# Patient Record
Sex: Male | Born: 1935
Health system: Southern US, Community
[De-identification: ages and names within clinical notes are randomized; demographics above are authoritative.]

## PROBLEM LIST (undated history)

## (undated) DIAGNOSIS — K449 Diaphragmatic hernia without obstruction or gangrene: Secondary | ICD-10-CM

## (undated) DIAGNOSIS — R002 Palpitations: Secondary | ICD-10-CM

## (undated) DIAGNOSIS — I10 Essential (primary) hypertension: Secondary | ICD-10-CM

## (undated) DIAGNOSIS — K219 Gastro-esophageal reflux disease without esophagitis: Secondary | ICD-10-CM

## (undated) DIAGNOSIS — K5790 Diverticulosis of intestine, part unspecified, without perforation or abscess without bleeding: Secondary | ICD-10-CM

## (undated) DIAGNOSIS — R9439 Abnormal result of other cardiovascular function study: Secondary | ICD-10-CM

## (undated) DIAGNOSIS — E119 Type 2 diabetes mellitus without complications: Secondary | ICD-10-CM

## (undated) DIAGNOSIS — IMO0001 Reserved for inherently not codable concepts without codable children: Secondary | ICD-10-CM

## (undated) DIAGNOSIS — E785 Hyperlipidemia, unspecified: Secondary | ICD-10-CM

## (undated) HISTORY — DX: Reserved for inherently not codable concepts without codable children: IMO0001

## (undated) HISTORY — DX: Diverticulosis of intestine, part unspecified, without perforation or abscess without bleeding: K57.90

## (undated) HISTORY — DX: Diaphragmatic hernia without obstruction or gangrene: K44.9

## (undated) HISTORY — PX: TONSILLECTOMY: SUR1361

## (undated) HISTORY — DX: Abnormal result of other cardiovascular function study: R94.39

## (undated) HISTORY — DX: Gastro-esophageal reflux disease without esophagitis: K21.9

## (undated) HISTORY — PX: ORIF CLAVICULAR FRACTURE: SHX5055

## (undated) HISTORY — PX: COLONOSCOPY: SHX174

## (undated) HISTORY — PX: INGUINAL HERNIA REPAIR: SHX194

## (undated) HISTORY — DX: Type 2 diabetes mellitus without complications: E11.9

## (undated) HISTORY — DX: Hyperlipidemia, unspecified: E78.5

## (undated) HISTORY — DX: Palpitations: R00.2

## (undated) HISTORY — PX: HEMORRHOID SURGERY: SHX153

## (undated) HISTORY — PX: CHOLECYSTECTOMY: SHX55

---

## 2001-06-06 ENCOUNTER — Emergency Department (HOSPITAL_COMMUNITY): Admission: EM | Admit: 2001-06-06 | Discharge: 2001-06-06 | Payer: Self-pay | Admitting: Emergency Medicine

## 2002-03-05 ENCOUNTER — Encounter (INDEPENDENT_AMBULATORY_CARE_PROVIDER_SITE_OTHER): Payer: Self-pay | Admitting: *Deleted

## 2002-06-24 ENCOUNTER — Encounter: Admission: RE | Admit: 2002-06-24 | Discharge: 2002-06-24 | Payer: Self-pay | Admitting: Internal Medicine

## 2002-06-24 ENCOUNTER — Encounter: Payer: Self-pay | Admitting: Internal Medicine

## 2002-12-30 ENCOUNTER — Emergency Department (HOSPITAL_COMMUNITY): Admission: EM | Admit: 2002-12-30 | Discharge: 2002-12-30 | Payer: Self-pay | Admitting: Emergency Medicine

## 2003-01-13 ENCOUNTER — Ambulatory Visit (HOSPITAL_COMMUNITY): Admission: RE | Admit: 2003-01-13 | Discharge: 2003-01-14 | Payer: Self-pay | Admitting: Orthopedic Surgery

## 2004-03-29 ENCOUNTER — Ambulatory Visit: Payer: Self-pay | Admitting: Internal Medicine

## 2005-03-24 ENCOUNTER — Ambulatory Visit: Payer: Self-pay | Admitting: Internal Medicine

## 2005-05-30 ENCOUNTER — Ambulatory Visit: Payer: Self-pay | Admitting: Gastroenterology

## 2005-06-07 ENCOUNTER — Ambulatory Visit: Payer: Self-pay | Admitting: Gastroenterology

## 2005-06-27 ENCOUNTER — Ambulatory Visit: Payer: Self-pay | Admitting: Gastroenterology

## 2005-07-01 ENCOUNTER — Encounter (INDEPENDENT_AMBULATORY_CARE_PROVIDER_SITE_OTHER): Payer: Self-pay | Admitting: *Deleted

## 2006-01-17 ENCOUNTER — Ambulatory Visit: Payer: Self-pay | Admitting: Internal Medicine

## 2006-02-06 ENCOUNTER — Encounter: Admission: RE | Admit: 2006-02-06 | Discharge: 2006-02-06 | Payer: Self-pay | Admitting: Internal Medicine

## 2006-06-05 ENCOUNTER — Ambulatory Visit: Payer: Self-pay | Admitting: Internal Medicine

## 2006-08-25 ENCOUNTER — Encounter: Payer: Self-pay | Admitting: Internal Medicine

## 2006-11-23 ENCOUNTER — Encounter: Payer: Self-pay | Admitting: Internal Medicine

## 2007-02-08 ENCOUNTER — Telehealth (INDEPENDENT_AMBULATORY_CARE_PROVIDER_SITE_OTHER): Payer: Self-pay | Admitting: *Deleted

## 2007-02-09 ENCOUNTER — Ambulatory Visit: Payer: Self-pay | Admitting: Internal Medicine

## 2007-02-09 DIAGNOSIS — R002 Palpitations: Secondary | ICD-10-CM | POA: Insufficient documentation

## 2007-02-09 DIAGNOSIS — I499 Cardiac arrhythmia, unspecified: Secondary | ICD-10-CM | POA: Insufficient documentation

## 2007-02-10 ENCOUNTER — Encounter (INDEPENDENT_AMBULATORY_CARE_PROVIDER_SITE_OTHER): Payer: Self-pay | Admitting: *Deleted

## 2007-02-16 ENCOUNTER — Encounter (INDEPENDENT_AMBULATORY_CARE_PROVIDER_SITE_OTHER): Payer: Self-pay | Admitting: *Deleted

## 2007-02-20 ENCOUNTER — Telehealth (INDEPENDENT_AMBULATORY_CARE_PROVIDER_SITE_OTHER): Payer: Self-pay | Admitting: *Deleted

## 2007-02-26 ENCOUNTER — Ambulatory Visit: Payer: Self-pay

## 2007-03-01 DIAGNOSIS — IMO0001 Reserved for inherently not codable concepts without codable children: Secondary | ICD-10-CM

## 2007-03-01 HISTORY — DX: Reserved for inherently not codable concepts without codable children: IMO0001

## 2007-03-09 ENCOUNTER — Encounter: Payer: Self-pay | Admitting: Internal Medicine

## 2007-03-15 ENCOUNTER — Telehealth (INDEPENDENT_AMBULATORY_CARE_PROVIDER_SITE_OTHER): Payer: Self-pay | Admitting: *Deleted

## 2007-03-21 ENCOUNTER — Telehealth (INDEPENDENT_AMBULATORY_CARE_PROVIDER_SITE_OTHER): Payer: Self-pay | Admitting: *Deleted

## 2007-03-27 ENCOUNTER — Ambulatory Visit: Payer: Self-pay | Admitting: Cardiology

## 2007-04-19 ENCOUNTER — Ambulatory Visit: Payer: Self-pay | Admitting: Cardiology

## 2007-05-28 ENCOUNTER — Ambulatory Visit: Payer: Self-pay | Admitting: Internal Medicine

## 2007-05-28 DIAGNOSIS — E119 Type 2 diabetes mellitus without complications: Secondary | ICD-10-CM | POA: Insufficient documentation

## 2007-05-28 DIAGNOSIS — R3915 Urgency of urination: Secondary | ICD-10-CM | POA: Insufficient documentation

## 2007-05-28 DIAGNOSIS — R351 Nocturia: Secondary | ICD-10-CM | POA: Insufficient documentation

## 2007-05-28 DIAGNOSIS — R3129 Other microscopic hematuria: Secondary | ICD-10-CM | POA: Insufficient documentation

## 2007-05-28 LAB — CONVERTED CEMR LAB
Bilirubin Urine: NEGATIVE
Glucose, Urine, Semiquant: NEGATIVE
Ketones, urine, test strip: NEGATIVE
Nitrite: NEGATIVE
Protein, U semiquant: NEGATIVE
Specific Gravity, Urine: 1.02
Urobilinogen, UA: NEGATIVE
WBC Urine, dipstick: NEGATIVE
pH: 5

## 2007-06-04 ENCOUNTER — Encounter (INDEPENDENT_AMBULATORY_CARE_PROVIDER_SITE_OTHER): Payer: Self-pay | Admitting: *Deleted

## 2007-06-11 ENCOUNTER — Encounter: Payer: Self-pay | Admitting: Internal Medicine

## 2007-06-19 ENCOUNTER — Encounter: Payer: Self-pay | Admitting: Internal Medicine

## 2007-09-26 ENCOUNTER — Encounter: Payer: Self-pay | Admitting: Internal Medicine

## 2008-01-08 ENCOUNTER — Encounter: Payer: Self-pay | Admitting: Internal Medicine

## 2008-04-25 ENCOUNTER — Encounter: Payer: Self-pay | Admitting: Internal Medicine

## 2008-07-25 ENCOUNTER — Encounter: Payer: Self-pay | Admitting: Internal Medicine

## 2008-11-04 ENCOUNTER — Encounter: Payer: Self-pay | Admitting: Internal Medicine

## 2009-02-24 ENCOUNTER — Encounter: Payer: Self-pay | Admitting: Internal Medicine

## 2009-06-03 ENCOUNTER — Encounter: Payer: Self-pay | Admitting: Internal Medicine

## 2009-07-07 ENCOUNTER — Encounter (INDEPENDENT_AMBULATORY_CARE_PROVIDER_SITE_OTHER): Payer: Self-pay | Admitting: *Deleted

## 2009-08-06 ENCOUNTER — Encounter: Payer: Self-pay | Admitting: Internal Medicine

## 2009-08-12 ENCOUNTER — Encounter: Payer: Self-pay | Admitting: Internal Medicine

## 2009-08-18 ENCOUNTER — Encounter: Payer: Self-pay | Admitting: Internal Medicine

## 2009-08-27 ENCOUNTER — Ambulatory Visit: Payer: Self-pay | Admitting: Gastroenterology

## 2009-08-27 DIAGNOSIS — K5730 Diverticulosis of large intestine without perforation or abscess without bleeding: Secondary | ICD-10-CM | POA: Insufficient documentation

## 2009-08-27 DIAGNOSIS — K219 Gastro-esophageal reflux disease without esophagitis: Secondary | ICD-10-CM | POA: Insufficient documentation

## 2009-09-02 ENCOUNTER — Encounter: Payer: Self-pay | Admitting: Internal Medicine

## 2009-10-06 ENCOUNTER — Telehealth: Payer: Self-pay | Admitting: Gastroenterology

## 2009-10-07 ENCOUNTER — Ambulatory Visit: Payer: Self-pay | Admitting: Gastroenterology

## 2009-10-07 DIAGNOSIS — R1013 Epigastric pain: Secondary | ICD-10-CM | POA: Insufficient documentation

## 2009-10-09 ENCOUNTER — Ambulatory Visit: Payer: Self-pay | Admitting: Gastroenterology

## 2009-10-13 ENCOUNTER — Encounter: Payer: Self-pay | Admitting: Gastroenterology

## 2009-10-15 ENCOUNTER — Ambulatory Visit: Payer: Self-pay | Admitting: Gastroenterology

## 2009-10-19 LAB — CONVERTED CEMR LAB
Fecal Occult Blood: NEGATIVE
OCCULT 1: NEGATIVE
OCCULT 2: NEGATIVE
OCCULT 3: NEGATIVE
OCCULT 4: NEGATIVE
OCCULT 5: NEGATIVE

## 2009-10-28 ENCOUNTER — Ambulatory Visit (HOSPITAL_COMMUNITY): Admission: RE | Admit: 2009-10-28 | Discharge: 2009-10-28 | Payer: Self-pay | Admitting: Gastroenterology

## 2009-12-07 ENCOUNTER — Telehealth: Payer: Self-pay | Admitting: Gastroenterology

## 2009-12-28 ENCOUNTER — Encounter: Payer: Self-pay | Admitting: Internal Medicine

## 2009-12-29 ENCOUNTER — Encounter: Payer: Self-pay | Admitting: Internal Medicine

## 2010-03-28 LAB — CONVERTED CEMR LAB
Calcium: 9 mg/dL (ref 8.4–10.5)
Magnesium: 2.1 mg/dL (ref 1.5–2.5)
Potassium: 3.7 meq/L (ref 3.5–5.1)
TSH: 1.5 microintl units/mL (ref 0.35–5.50)

## 2010-03-30 NOTE — Procedures (Signed)
Summary: Upper Endoscopy  Patient: Scott Herman Note: All result statuses are Final unless otherwise noted.  Tests: (1) Upper Endoscopy (EGD)   EGD Upper Endoscopy       DONE     [facility]     [address]     [place], [place]  [address]           ENDOSCOPY PROCEDURE REPORT           PATIENT:  Scott Herman, Scott Herman  MR#:  [id number]     BIRTHDATE:  [birth year], 76 yrs. old  GENDER:  male     ENDOSCOPIST:  Judie Petit T. Russella Dar, MD, [facility]           PROCEDURE DATE:  10/09/2009     PROCEDURE:  EGD with biopsy     ASA CLASS:  Class II     INDICATIONS:  GERD, chest pain, epigastric pain     MEDICATIONS:  Fentanyl 50 mcg IV, Versed 5 mg IV     TOPICAL ANESTHETIC:  Exactacain Spray     DESCRIPTION OF PROCEDURE:   After the risks benefits and     alternatives of the procedure were thoroughly explained, informed     consent was obtained.  The LB GIF-H180 [id number] endoscope was     introduced through the mouth and advanced to the second portion of     the duodenum, without limitations.  The instrument was slowly     withdrawn as the mucosa was fully examined.     <<PROCEDUREIMAGES>>     The esophagus and gastroesophageal junction were completely normal     in appearance.  The duodenal bulb was normal in appearance, as was     the postbulbar duodenum.  Mild gastritis was found in the antrum.     Multiple biopsies were obtained and sent to pathology.  The     stomach was entered and closely examined. The angularis, and     lesser curvature were well visualized, including a retroflexed     view of the cardia and fundus. The stomach wall was normally     distensable. The scope passed easily through the pylorus into the     duodenum. Retroflexed views revealed no abnormalities. The scope     was then withdrawn from the patient and the procedure completed.           COMPLICATIONS:  None           ENDOSCOPIC IMPRESSION:     1) Mild gastritis           RECOMMENDATIONS:     1) Await  pathology results     2) Anti-reflux regimen     3) continue PPI     4) If GES is normal and/or if symptoms persist consider esophageal     manometry     5) Office visit in 4 weeks           Malcolm T. Russella Dar, MD, [facility]           CC:  Pecola Lawless, MD           n.     Rosalie DoctorVenita Lick. Stark at 10/09/2009 04:19 PM           Gaspar Garbe, [id number]  Note: An exclamation mark (!) indicates a result that was not dispersed into the flowsheet. Document Creation Date: 10/09/2009 4:20 PM _______________________________________________________________________  (1) Order result status: Final Collection or observation date-time: 10/09/2009 16:14  Requested date-time:  Receipt date-time:  Reported date-time:  Referring Physician:   Ordering Physician: Claudette Head [phone]) Specimen Source:  Source: Launa Grill Order Number: [id number] Lab site:

## 2010-03-30 NOTE — Letter (Signed)
Summary: Patient [facility] Biopsy Results  [facility]  [address] [place], [place] [address]   Phone: [phone]  Fax: [phone]        October 13, 2009 MRN: [id number]    [facility] [address] [place], [place]  [address]    Dear Mr. Tidwell,  I am pleased to inform you that the biopsies taken during your recent endoscopic examination did not show any evidence of cancer upon pathologic examination. The biopsies were normal.  Continue with the treatment plan as outlined on the day of your      exam.  Please call us if you are having persistent problems or have questions about your condition that have not been fully answered at this time.  Sincerely,  Meryl Dare MD [facility]  This letter has been electronically signed by your physician.  Appended Document: Patient Notice-Endo Biopsy Results letter mailed

## 2010-03-30 NOTE — Letter (Signed)
Summary: [facility]  [facility]   Imported By: Lanelle Bal 08/20/2009 09:46:39  _____________________________________________________________________  External Attachment:    Type:   Image     Comment:   External Document

## 2010-03-30 NOTE — Progress Notes (Signed)
Summary: refill request  Medications Added DEXILANT 60 MG CPDR (DEXLANSOPRAZOLE) one tablet by mouth once daily       Phone Note Call from Patient Call back at [facility] Phone [phone]   Caller: Patient Call For: Dr. Russella Dar Reason for Call: Refill Medication Summary of Call: Dexilant... [facility]... please call pt Initial call taken by: Vallarie Mare,  December 07, 2009 2:50 PM  Follow-up for Phone Call        Rx was sent to pts pharmacy.  Follow-up by: Christie Nottingham CMA Duncan Dull),  December 07, 2009 4:12 PM    New/Updated Medications: DEXILANT 60 MG CPDR (DEXLANSOPRAZOLE) one tablet by mouth once daily Prescriptions: DEXILANT 60 MG CPDR (DEXLANSOPRAZOLE) one tablet by mouth once daily  #90 x 3   Entered by:   Christie Nottingham CMA (AAMA)   Authorized by:   Meryl Dare MD [facility]   Signed by:   Christie Nottingham CMA (AAMA) on 12/07/2009   Method used:   Electronically to        [facility]* (retail)             ,          Ph: [phone]       Fax: [phone]   RxID:   [id number]

## 2010-03-30 NOTE — Letter (Signed)
Summary: [facility]  [facility]   Imported By: Lanelle Bal 08/20/2009 09:44:47  _____________________________________________________________________  External Attachment:    Type:   Image     Comment:   External Document

## 2010-03-30 NOTE — Letter (Signed)
Summary: [facility]  [facility]   Imported By: Lanelle Bal 01/12/2010 10:55:01  _____________________________________________________________________  External Attachment:    Type:   Image     Comment:   External Document

## 2010-03-30 NOTE — Letter (Signed)
Summary: New Patient letter  [facility]  [address] [place], [place] [address]   Phone: [phone]  Fax: [phone]       07/07/2009 MRN: [id number]  [facility] [address] [place], [place]  [address]  Dear Mr. Scholz,  Welcome to the Gastroenterology Division at [facility].    You are scheduled to see Dr. Russella Dar on 08/27/2009 at 9:30AM on the 3rd floor at [facility], [address]  We ask that you try to arrive at our office 15 minutes prior to your appointment time to allow for check-in.  We would like you to complete the enclosed self-administered evaluation form prior to your visit and bring it with you on the day of your appointment.  We will review it with you.  Also, please bring a complete list of all your medications or, if you prefer, bring the medication bottles and we will list them.  Please bring your insurance card so that we may make a copy of it.  If your insurance requires a referral to see a specialist, please bring your referral form from your primary care physician.  Co-payments are due at the time of your visit and may be paid by cash, check or credit card.     Your office visit will consist of a consult with your physician (includes a physical exam), any laboratory testing he/she may order, scheduling of any necessary diagnostic testing (e.g. x-ray, ultrasound, CT-scan), and scheduling of a procedure (e.g. Endoscopy, Colonoscopy) if required.  Please allow enough time on your schedule to allow for any/all of these possibilities.    If you cannot keep your appointment, please call [phone] to cancel or reschedule prior to your appointment date.  This allows [place] the opportunity to schedule an appointment for another patient in need of care.  If you do not cancel or reschedule by 5 p.m. the business day prior to your appointment date, you will be charged a $50.00 late cancellation/no-show fee.    Thank you for choosing  [facility] for your medical needs.  We appreciate the opportunity to care for you.  Please visit [place] at our website  to learn more about our practice.                     Sincerely,                                                             The Gastroenterology Division

## 2010-03-30 NOTE — Letter (Signed)
Summary: [facility]  [facility]   Imported By: Lanelle Bal 03/09/2009 09:23:12  _____________________________________________________________________  External Attachment:    Type:   Image     Comment:   External Document

## 2010-03-30 NOTE — Letter (Signed)
Summary: [facility]  [facility]   Imported By: Lanelle Bal 06/11/2009 09:07:47  _____________________________________________________________________  External Attachment:    Type:   Image     Comment:   External Document

## 2010-03-30 NOTE — Assessment & Plan Note (Signed)
Summary: 6-WEEK F/U APPT...LSW.   History of Present Illness Visit Type: Follow-up Visit Primary GI MD: Elie Goody MD [facility] Primary Tu Shimmel: Marga Melnick, MD  Requesting Donnella Morford: na Chief Complaint: Upper abd pain, belching, and pain in esopaghus when he swallows  History of Present Illness:   This 77 year old male returns for followup of epigastric pain and chest pain. He describes a soreness and difficulty when swallowing intermittently. She also has episodic epigastric pain and belching. His symptoms have not improved on omeprazole 40 mg daily, although he does have some temporary benefit when he takes Maalox. His appetite is excellent and his weight has been stable. He has no other gastrointestinal complaints.   GI Review of Systems    Reports abdominal pain and  belching.     Location of  Abdominal pain: upper abdomen.    Denies acid reflux, bloating, chest pain, dysphagia with liquids, dysphagia with solids, heartburn, loss of appetite, nausea, vomiting, vomiting blood, weight loss, and  weight gain.        Denies anal fissure, black tarry stools, change in bowel habit, constipation, diarrhea, diverticulosis, fecal incontinence, heme positive stool, hemorrhoids, irritable bowel syndrome, jaundice, light color stool, liver problems, rectal bleeding, and  rectal pain.   Current Medications (verified): 1)  Adult Aspirin Ec Low Strength 81 Mg  Tbec (Aspirin) .[name] Kitchen.. 1 By Mouth Qd 2)  Glucophage Xr 750 Mg  Tb24 (Metformin Hcl) .... Take One Tab Two Times A Day 3)  Actos 45 Mg  Tabs (Pioglitazone Hcl) .[name] Kitchen.. 1 By Mouth Qd 4)  Januvia 100 Mg  Tabs (Sitagliptin Phosphate) .... Take One Tablet Every Evening 5)  Pravachol 80 Mg  Tabs (Pravastatin Sodium) .... Take One Tablet At Bedtime 6)  Lantus Solostar 100 Unit/ml  Soln (Insulin Glargine) .[name] Kitchen.. 12 Units Qam 7)  Bd Ultra-Fine Pen Needles 29g X 12.105mm  Misc (Insulin Pen Needle) .... Use Daily With Lantus Solopen 8)  Lifescan Strips and  Lancets .... Insulin Dependent Check Up To Five Times Daily 9)  Flomax 0.4 Mg  Cp24 (Tamsulosin Hcl) .[name] Kitchen.. 1 Qd 10)  Omeprazole 40 Mg Cpdr (Omeprazole) .... One Tablet By Mouth Every Morning 11)  Gas-X 80 Mg Chew (Simethicone) .... As Needed 12)  Vitamin D3 1000 Unit Tabs (Cholecalciferol) .... One Tablet By Mouth Once Daily 13)  Coq10 100 Mg Caps (Coenzyme Q10) .... One Capsule By Mouth Once Daily 14)  Omega-3 350 Mg Caps (Omega-3 Fatty Acids) .... One Capsule By Mouth Once Daily 15)  Vitamin E 600 Unit  Caps (Vitamin E) .... One Capsule By Mouth Once Daily 16)  Prostate Health  Caps (Misc Natural Products) .... One Capsule By Mouth Once Daily 17)  Olive Leaf Extract 500 Mg Caps (Olive Leaf) .... One Capsule By Mouth Once Daily 18)  Multivitamins   Tabs (Multiple Vitamin) .... One Tablet By Mouth Once Daily  Allergies (verified): No Known Drug Allergies  Past History:  Past Medical History: Reviewed history from 08/26/2009 and no changes required. PALPITATIONS (ICD-785.1) ABNORMAL HEART RHYTHMS (ICD-427.9) Diabetes mellitus, type II insulin dependent Diverticulosis Hiatal hernia Hyperlipidemia GERD  Past Surgical History: Reviewed history from 05/28/2007 and no changes required. Cholecystectomy Inguinal herniorrhaphy Hemorrhoidectomy clavicular fracture gastritis upper endo colonoscopy tics 12/2002  Family History: father prostate cancer,diabetes, diverticular disease paternal grandfather prostate cancer mother cva  paternal grandmother diabetes Family History of Prostate Cancer:Father No FH of Colon Cancer:  Social History: Reviewed history from 08/27/2009 and no changes required. Never Smoked Alcohol use-no  Regular exercise-yes two miles per day 3-4 times a week Occupation: Retired Daily Caffeine Use Illicit Drug Use - no  Review of Systems       The patient complains of fatigue, muscle pains/cramps, and sleeping problems.         The pertinent positives and  negatives are noted as above and in the HPI. All other ROS were reviewed and were negative.   Vital Signs:  Patient profile:   77 year old male Height:      71 inches Weight:      171 pounds BMI:     23.94 BSA:     1.97 Pulse rate:   74 / minute Pulse rhythm:   regular BP sitting:   128 / 64  (left arm) Cuff size:   regular  Vitals Entered By: Ok Anis CMA (October 07, 2009 4:07 PM)  Physical Exam  General:  Well developed, well nourished, no acute distress. Head:  Normocephalic and atraumatic. Eyes:  PERRLA, no icterus. Mouth:  No deformity or lesions, dentition normal. Chest Wall:  Symmetrical,  no deformities . Lungs:  Clear throughout to auscultation. Heart:  Regular rate and rhythm; no murmurs, rubs,  or bruits. Abdomen:  Soft, nontender and nondistended. No masses, hepatosplenomegaly or hernias noted. Normal bowel sounds. Psych:  Alert and cooperative. Normal mood and affect.  Impression & Recommendations:  Problem # 1:  GERD (ICD-530.81) Probable GERD. Rule out esophageal strictures, esophagitis, ulcer disease and upper gastrointestinal tract neoplasms. Rule out gastroparesis. The risks, benefits and alternatives to endoscopy with possible biopsy and possible dilation were discussed with the patient and they consent to proceed. The procedure will be scheduled electively. Discontinue omeprazole and begin Dexilant 60 mg q.a.m. Orders: EGD (EGD) Gastric Emptying Scan (GES)  Problem # 2:  ABDOMINAL PAIN-EPIGASTRIC (ICD-789.06) See problem #1.  Problem # 3:  SCREENING COLORECTAL-CANCER (ICD-V76.51) Colorectal cancer screening with Hemoccult. If Hemocculs are negative a 10 year interval for screening colonoscopy would be due in January 2014. Orders: [facility] Hemoccult Cards #3 (take home) (Hem cards #3)  Patient Instructions: 1)  Upper Endoscopy brochure given.  2)  You have been scheduled for a Gastric Emptying Scan. 3)  Start Dexilant samples one tablet by mouth  once daily and a coupon was given for a prescription if this medicine helps with your reflux.  4)  Follow instructions on Hemoccult cards and return to [place] when finished.  5)  Copy sent to : Marga Melnick, MD 6)  The medication list was reviewed and reconciled.  All changed / newly prescribed medications were explained.  A complete medication list was provided to the patient / caregiver.

## 2010-03-30 NOTE — Assessment & Plan Note (Signed)
Summary: Gastroenterology  ZAID TOMES MR#:  [id number] Page #  NAME:  Scott Herman, Scott Herman  OFFICE NO:  [id number]  DATE:  07/01/05  DOB:  2033-04-03  HISTORY OF PRESENT ILLNESS:  This very nice patient comes in, having been referred by Dr. Lona Kettle.  He says he is having difficulty with heartburn and stomach discomfort.  It has been ongoing.  He has been taking Prevacid.  Esophagus feels full, seems to fill up quite easily.  He gets midepigastric pain.  He says he cannot sleep on the right side.  He says he has had to cut out coffee and colas.  Drinks Mylanta like it is out of fashion and says it relieves for a few minutes and that is it.  He says he has had similar problems in 2004, and they resolved.  Now it has returned.  Gaviscon did seem to help him.  Takes Prevacid SoluTabs every morning.  Patient is status post cholecystectomy approximately 20 years ago.  It should be noted that I did an upper endoscopic examination on him in January 2004.  He was having some dysphagia and reflux symptoms at the time, and it was found that he had a 3-cm hiatal hernia with some mild esophagitis.  He was dilated with a #54 _____ dilator.  The mucosa in his stomach was somewhat inflamed and negative for H. pylori.  Colonoscopic examination done the same day revealed minimal diverticulosis and otherwise was normal.  FAMILY HISTORY:  His family history is noncontributory except his father having diverticular disease.  PAST MEDICAL HISTORY:  His past medical history revealed that he does have diabetes.  He is status post cholecystectomy and hemorrhoid surgery.  SOCIAL HISTORY:  Noncontributory.  He does not drink or smoke.  He has a Manufacturing engineer, is retired from [employer].  REVIEW OF SYSTEMS:  Basically noncontributory except for having sleeping problems.  PHYSICAL EXAMINATION:  On physical examination, he was 5 feet 11, weighed 172; blood pressure 98/58; pulse 68 and regular.  His oropharynx was negative.   Neck was negative.  Chest was clear.  Heart revealed a regular rhythm without significant murmur.  Abdomen was within normal limits.  There were no bruits, rubs.  There was no tenderness on palpation, and there was no organomegaly noted.  His extremities were unremarkable.  IMPRESSION: 1.  Abdominal pain with symptoms of gastroesophageal reflux disease in patient who is status post cholecystectomy. 2.  Over 10 years' history of diabetes. 3.  Hyperlipidemia.  RECOMMENDATIONS:  Patient should continue on his present medications including glipizide, Actos, metformin, Zetia, Pravachol, baby aspirin, Lantus insulin, Prevacid SoluTabs, and Gaviscon p.r.n.  I suggested he increase his Prevacid SoluTabs to 2 b.i.d. for at least 10 days, decrease the Mylanta.  I gave him some literature on intestinal disease.  I told him to continue cutting out coffee and colas.  He is to return in followup after the above studies are obtained.  We also advised him to decrease his disaccharide intake.     Ulyess Mort, M.D.  [id number] D:  07/01/05; T:  ; Job [phone]

## 2010-03-30 NOTE — Assessment & Plan Note (Signed)
Summary: ABD PAIN AND "DISCOMFORT" IN ESOPHAGUS...AS.   History of Present Illness Visit Type: Initial Visit Primary GI MD: Elie Goody MD [facility] Primary Provider: Oren Bracket Chief Complaint: Upper abd pain, acid reflux x 3 monnths with some improvement History of Present Illness:   This is a 77 year old male with a long history of GERD, who was been previously evaluated by Dr. Victorino Dike. He underwent upper endoscopy in January 2004 for dysphagia and reflux symptoms. Maloney dilation was performed. Presbyesophagus and chronic gastritis was noted on upper endoscopy. He was managed with Prevacid 30 mg twice daily at the time. He states he has been maintained on over-the-counter proton pump inhibitors for a few years. Over the past 3 months. He has had frequent upper abdominal discomfort associated with belching. She describes as very similar to his symptoms in 2004.   GI Review of Systems    Reports abdominal pain, acid reflux, and  belching.     Location of  Abdominal pain: upper abdomen.    Denies bloating, chest pain, dysphagia with liquids, dysphagia with solids, heartburn, loss of appetite, nausea, vomiting, vomiting blood, weight loss, and  weight gain.        Denies anal fissure, black tarry stools, change in bowel habit, constipation, diarrhea, diverticulosis, fecal incontinence, heme positive stool, hemorrhoids, irritable bowel syndrome, jaundice, light color stool, liver problems, rectal bleeding, and  rectal pain. Preventive Screening-Counseling & Management      Drug Use:  no.     Current Medications (verified): 1)  Adult Aspirin Ec Low Strength 81 Mg  Tbec (Aspirin) .[name] Kitchen.. 1 By Mouth Qd 2)  Glucophage Xr 750 Mg  Tb24 (Metformin Hcl) .... Take One Tab Two Times A Day 3)  Actos 45 Mg  Tabs (Pioglitazone Hcl) .[name] Kitchen.. 1 By Mouth Qd 4)  Januvia 100 Mg  Tabs (Sitagliptin Phosphate) .... Take One Tablet Every Evening 5)  Pravachol 80 Mg  Tabs (Pravastatin Sodium) .... Take One  Tablet At Bedtime 6)  Prevacid 15 Mg Cpdr (Lansoprazole) .... Take 1 Tablet By Mouth 30 Minutes Before Meal  Every Morning 7)  Lantus Solostar 100 Unit/ml  Soln (Insulin Glargine) .[name] Kitchen.. 12 Units Qam 8)  Bd Ultra-Fine Pen Needles 29g X 12.5mm  Misc (Insulin Pen Needle) .... Use Daily With Lantus Solopen 9)  Lifescan Strips and Lancets .... Insulin Dependent Check Up To Five Times Daily 10)  Flomax 0.4 Mg  Cp24 (Tamsulosin Hcl) .[name] Kitchen.. 1 Qd  Allergies (verified): No Known Drug Allergies  Past History:  Past Medical History: Reviewed history from 08/26/2009 and no changes required. PALPITATIONS (ICD-785.1) ABNORMAL HEART RHYTHMS (ICD-427.9) Diabetes mellitus, type II insulin dependent Diverticulosis Hiatal hernia Hyperlipidemia GERD  Past Surgical History: Reviewed history from 05/28/2007 and no changes required. Cholecystectomy Inguinal herniorrhaphy Hemorrhoidectomy clavicular fracture gastritis upper endo colonoscopy tics 12/2002  Family History: father prostate cancer,diabetes, diverticular disease paternal grandfather prostate cancer mother cva  paternal grandmother diabetes Family History of Prostate Cancer:Father  Social History: Never Smoked Alcohol use-no Regular exercise-yes two miles per day 3-4 times a week Occupation: Retired Daily Caffeine Use Illicit Drug Use - no Drug Use:  no  Review of Systems       The patient complains of sleeping problems and urination - excessive.  The patient denies allergy/sinus, anemia, anxiety-new, arthritis/joint pain, back pain, blood in urine, breast changes/lumps, change in vision, confusion, cough, coughing up blood, depression-new, fainting, fatigue, fever, headaches-new, hearing problems, heart murmur, heart rhythm changes, itching, menstrual pain, muscle pains/cramps,  night sweats, nosebleeds, pregnancy symptoms, shortness of breath, skin rash, sore throat, swelling of feet/legs, swollen lymph glands, thirst - excessive ,  urination - excessive , urination changes/pain, urine leakage, vision changes, and voice change.    Vital Signs:  Patient profile:   77 year old male Height:      71 inches Weight:      167.38 pounds BMI:     23.43 Pulse rate:   76 / minute Pulse rhythm:   regular BP sitting:   110 / 58  (left arm) Cuff size:   regular  Vitals Entered By: June McMurray CMA Duncan Dull) (August 27, 2009 9:19 AM)  Physical Exam  General:  Well developed, well nourished, no acute distress. Head:  Normocephalic and atraumatic. Eyes:  PERRLA, no icterus. Ears:  Normal auditory acuity. Mouth:  No deformity or lesions, dentition normal. Neck:  Supple; no masses or thyromegaly. Lungs:  Clear throughout to auscultation. Heart:  Regular rate and rhythm; no murmurs, rubs,  or bruits. Abdomen:  Soft, nontender and nondistended. No masses, hepatosplenomegaly or hernias noted. Normal bowel sounds. Msk:  Symmetrical with no gross deformities. Normal posture. Pulses:  Normal pulses noted. Extremities:  No clubbing, cyanosis, edema or deformities noted. Neurologic:  Alert and  oriented x4;  grossly normal neurologically. Cervical Nodes:  No significant cervical adenopathy. Inguinal Nodes:  No significant inguinal adenopathy. Psych:  Alert and cooperative. Normal mood and affect.  Impression & Recommendations:  Problem # 1:  GERD (ICD-530.81) Presumed GERD flare. Intensify all antireflux measures. Increase Prevacid to 30 mg daily, and then change to omeprazole 40 mg q.a.m. Return office visit in 6 weeks and if the symptoms are not under complete control proceed with upper endoscopy.  Problem # 2:  DIVERTICULOSIS-COLON (ICD-562.10) Long-term high fiber diet with adequate daily water intake.  Problem # 3:  SCREENING COLORECTAL-CANCER (ICD-V76.51) Average risk for colorectal cancer. Screening colonoscopy due in January 2014.  Patient Instructions: 1)  Your prescription for omeprazole has been sent to your pharmacy  for a 30 day supply and [facility] for a 90 day supply.  2)  Increase your Prevacid two capsules by mouth once daily until finished. 3)  Avoid foods high in acid content ( tomatoes, citrus juices, spicy foods) . Avoid eating within 3 to 4 hours of lying down or before exercising. Do not over eat; try smaller more frequent meals. Elevate head of bed four inches when sleeping.  4)  Please schedule a follow-up appointment in 6 weeks.  5)  Copy sent to : Marga Melnick, MD 6)  The medication list was reviewed and reconciled.  All changed / newly prescribed medications were explained.  A complete medication list was provided to the patient / caregiver.  Prescriptions: OMEPRAZOLE 40 MG CPDR (OMEPRAZOLE) one tablet by mouth every morning  #90 x 3   Entered by:   Christie Nottingham CMA (AAMA)   Authorized by:   Meryl Dare MD [facility]   Signed by:   Christie Nottingham CMA (AAMA) on 08/27/2009   Method used:   Electronically to        [facility]* (retail)             ,          Ph: [phone]       Fax: [phone]   RxID:   [id number] OMEPRAZOLE 40 MG CPDR (OMEPRAZOLE) one tablet by mouth every morning  #30 x 0   Entered by:   Christie Nottingham CMA (  AAMA)   Authorized by:   Meryl Dare MD [facility]   Signed by:   Christie Nottingham CMA (AAMA) on 08/27/2009   Method used:   Electronically to        [phone]* (retail)       [address]       [place], [place]  [address]       Ph: [phone]       Fax: [phone]   RxID:   [id number]

## 2010-03-30 NOTE — Letter (Signed)
Summary: Diabetic Instructions  [facility]  [address] [place], [place] [address]   Phone: [phone]  Fax: [phone]    RADAMES MEJORADO [birth year] MRN: [id number]   _x  _   ORAL DIABETIC MEDICATION INSTRUCTIONS  The day before your procedure:   Take your diabetic pill as you do normally  The day of your procedure:   Do not take your diabetic pill    We will check your blood sugar levels during the admission process and again in Recovery before discharging you home  ________________________________________________________________________  _  x_   INSULIN (LONG ACTING) MEDICATION INSTRUCTIONS (Lantus, NPH, 70/30, Humulin, Novolin-N)   The day before your procedure:   Take  your regular evening dose    The day of your procedure:   Do not take your morning dose

## 2010-03-30 NOTE — Procedures (Signed)
Summary: Colonoscopy   Colonoscopy  Procedure date:  03/05/2002  Findings:      Results: Diverticulosis.       Location:  [facility].    Procedures Next Due Date:    Colonoscopy: 03/2007 Patient Name: Scott Herman, Scott Herman MRN:  Procedure Procedures: Colonoscopy CPT: 16109.  Personnel: Endoscopist: Ulyess Mort, MD.  Referred By: Titus Dubin. Alwyn Ren, MD.  Exam Location: Exam performed in Outpatient Clinic. Outpatient  Patient Consent: Procedure, Alternatives, Risks and Benefits discussed, consent obtained, from patient. Consent was obtained by the RN.  Indications  Average Risk Screening Routine.  History  Pre-Exam Physical: Performed Mar 05, 2002. Cardio-pulmonary exam, HEENT exam , Abdominal exam, Extremity exam, Mental status exam WNL.  Exam Exam: Extent of exam reached: Cecum, extent intended: Cecum.  The cecum was identified by appendiceal orifice and IC valve. Colon retroflexion performed. Images were not taken. ASA Classification: II. Tolerance: good.  Monitoring: Pulse and BP monitoring, Oximetry used. Supplemental O2 given.  Colon Prep Prep results: good.  Sedation Meds: Patient assessed and found to be appropriate for moderate (conscious) sedation. Fentanyl 125 mcg. given IV. Versed 10 mg. given IV.  Findings - DIVERTICULOSIS: Sigmoid Colon. ICD9: Diverticulosis: 562.10. Comments: minnimal.  - NOT SEEN ON EXAM: Cecum to Rectum. Polyps, AVM's, Colitis, Tumors, Melanosis, Crohn's, Diverticulosis, Hemorrhoids,   Assessment Abnormal examination, see findings above.  Diagnoses: 562.10: Diverticulosis.   Events  Unplanned Interventions: No intervention was required.  Unplanned Events: There were no complications. Plans Medication Plan: Continue current medications.  Patient Education: Patient given standard instructions for: Diverticulosis. Yearly hemoccult testing recommended. Patient instructed to get routine colonoscopy every 5  years.  Disposition: After procedure patient sent to recovery. After recovery patient sent home.  This report was created from the original endoscopy report, which was reviewed and signed by the above listed endoscopist.    cc: Titus Dubin. Alwyn Ren, MD

## 2010-03-30 NOTE — Letter (Signed)
Summary: EGD Instructions  [facility]  [address] [place], [place] [address]   Phone: [phone]  Fax: [phone]       KEYDEN PAVLOV    November 27, 1933    MRN: [id number]       Procedure Day /Date: Friday August 12th, 2011     Arrival Time:  3:00pm     Procedure Time: 4:00pm     Location of Procedure:                    _ x _ [facility] (4th Floor)    PREPARATION FOR ENDOSCOPY   On 10/09/09 THE DAY OF THE PROCEDURE:  1.   No solid foods, milk or milk products are allowed after midnight the night before your procedure.  2.   Do not drink anything colored red or purple.  Avoid juices with pulp.  No orange juice.  3.  You may drink clear liquids until 2:00pm, which is 2 hours before your procedure.                                                                                                CLEAR LIQUIDS INCLUDE: Water Jello Ice Popsicles Tea (sugar ok, no milk/cream) Powdered fruit flavored drinks Coffee (sugar ok, no milk/cream) Gatorade Juice: apple, white grape, white cranberry  Lemonade Clear bullion, consomm, broth Carbonated beverages (any kind) Strained chicken noodle soup Hard Candy   MEDICATION INSTRUCTIONS  Unless otherwise instructed, you should take regular prescription medications with a small sip of water as early as possible the morning of your procedure.  Diabetic patients - see separate instructions.        OTHER INSTRUCTIONS  You will need a responsible adult at least 75 years of age to accompany you and drive you home.   This person must remain in the waiting room during your procedure.  Wear loose fitting clothing that is easily removed.  Leave jewelry and other valuables at home.  However, you may wish to bring a book to read or an iPod/MP3 player to listen to music as you wait for your procedure to start.  Remove all body piercing jewelry and leave at home.  Total time from sign-in until discharge is  approximately 2-3 hours.  You should go home directly after your procedure and rest.  You can resume normal activities the day after your procedure.  The day of your procedure you should not:   Drive   Make legal decisions   Operate machinery   Drink alcohol   Return to work  You will receive specific instructions about eating, activities and medications before you leave.    The above instructions have been reviewed and explained to me by   _______________________    I fully understand and can verbalize these instructions _____________________________ Date _________

## 2010-03-30 NOTE — Letter (Signed)
Summary: [facility] Endocrinology & Diabetes  [facility] Endocrinology & Diabetes   Imported By: Lanelle Bal 09/21/2009 12:54:59  _____________________________________________________________________  External Attachment:    Type:   Image     Comment:   External Document

## 2010-03-30 NOTE — Procedures (Signed)
Summary: EGD   EGD  Procedure date:  03/05/2002  Findings:      Findings: Esophagitis  Findings: Gastritis  Location: [facility]    EGD  Procedure date:  03/05/2002  Findings:      Findings: Esophagitis  Findings: Gastritis  Location: [facility]   Patient Name: Scott Herman, Scott Herman MRN:  Procedure Procedures: Panendoscopy (EGD) CPT: 43235.    with esophageal dilation. CPT: G9296129.  Personnel: Endoscopist: Ulyess Mort, MD.  Referred By: Titus Dubin. Alwyn Ren, MD.  Exam Location: Exam performed in Outpatient Clinic. Outpatient  Patient Consent: Procedure, Alternatives, Risks and Benefits discussed, consent obtained, from patient. Consent was obtained by the RN.  Indications Symptoms: Dysphagia. Reflux symptoms  History  Pre-Exam Physical: Performed Mar 05, 2002  Cardio-pulmonary exam, HEENT exam, Abdominal exam, Extremity exam, Mental status exam WNL.  Exam Exam Info: Maximum depth of insertion Duodenum, intended Duodenum. Vocal cords visualized. Gastric retroflexion was not performed. Images were not taken. ASA Classification: II. Tolerance: good.  Sedation Meds: Patient assessed and found to be appropriate for moderate (conscious) sedation. Fentanyl 100 mcg. given IV. Versed 10 mg. given IV.  Monitoring: BP and pulse monitoring done. Oximetry used. Supplemental O2 given  Findings - HIATAL HERNIA: 3 cms. in length. ICD9: Esophagitis: 530.10.Comments: mild presbyesophagous.  - Dilation: Proximal Esophagus. Maloney dilator used, Diameter: 54 mm, Minimal Resistance, No Heme present on extraction. Patient tolerance good.  - MUCOSAL ABNORMALITY: Antrum to Jejunum. Granular mucosa.  - MUCOSAL ABNORMALITY: Fundus to Antrum. Erythematous mucosa. RUT done, results pending. ICD9: Gastritis, Chronic: 535.10.   Assessment Abnormal examination, see findings above.  Diagnoses: 530.10: Esophagitis.  535.10: Gastritis, Chronic.    Events  Unplanned Intervention: No unplanned interventions were required.  Unplanned Events: There were no complications. Plans Medication(s): Continue current medications. PPI: Esomeprazole/Nexium 40 mg QAM,   Patient Education: Patient given standard instructions for: Hiatal Hernia. Reflux.  Disposition: After procedure patient sent to recovery. After recovery patient sent home.  This report was created from the original endoscopy report, which was reviewed and signed by the above listed endoscopist.    cc: Titus Dubin. Alwyn Ren, MD  RUT: Negative

## 2010-03-30 NOTE — Letter (Signed)
Summary: [facility]  [facility]   Imported By: Lanelle Bal 11/10/2009 08:47:25  _____________________________________________________________________  External Attachment:    Type:   Image     Comment:   External Document

## 2010-03-30 NOTE — Progress Notes (Signed)
Summary: Triage   Phone Note Call from Patient Call back at Work Phone [phone]   Caller: Patient Call For: Dr. Russella Dar Summary of Call: pt. has an appt. on 10-21-09...symptoms are getting worse. Esophagus hurts when he eats....requesting sooner appt. Initial call taken by: Karna Christmas,  October 06, 2009 11:45 AM  Follow-up for Phone Call        Appt. moved up to tomorrow at 4 p.m. Follow-up by: Teryl Lucy RN,  October 06, 2009 11:54 AM

## 2010-04-19 ENCOUNTER — Encounter: Payer: Self-pay | Admitting: Internal Medicine

## 2010-04-27 NOTE — Letter (Signed)
Summary: [facility] Endocrinology & Diabetes  [facility] Endocrinology & Diabetes   Imported By: Maryln Gottron 04/23/2010 10:34:05  _____________________________________________________________________  External Attachment:    Type:   Image     Comment:   External Document

## 2010-05-14 LAB — GLUCOSE, CAPILLARY
Glucose-Capillary: 83 mg/dL (ref 70–99)
Glucose-Capillary: 88 mg/dL (ref 70–99)

## 2010-07-13 NOTE — Procedures (Signed)
[facility]                              EXERCISE TREADMILL   NAME:Scott Herman, Scott Herman                     MRN:          [id number]  DATE:04/19/2007                            DOB:          07/04/1933    PROCEDURE:  Exercise treadmill test.   INDICATIONS:  A patient with multiple cardiovascular risk factors.   PROCEDURE NOTE:  The patient is exercised using standard Bruce protocol.  He was able to exercise for 9 minutes which completed stage III.  Achieved 10.4 METS.  Achieved a heart rate of 166, which was 111% of  predicted.  He had an appropriate blood pressed responsive 190/80 peak.  Test was terminated because he had achieved his target heart rate.  I  took him above his target heart rate because he reproduces this level of  exercise routinely when he rides his bike.  With his peak heart rate, he  was having no chest discomfort or shortness of breath.  No palpitations.  He did have some upsloping ST-segment changes, but these did not meet  criteria for ischemia.  All of his changes resolved early in recovery.  He had normal heart rate recovery.   CONCLUSION:  Negative adequate exercise treadmill test.  He had an  excellent exercise tolerance for his age.  He had a slightly accelerated  blood pressure response.  However, this is a very low risk study for  obstructive coronary disease.  He would be a lower risk category for  future cardiovascular events.  No further cardiovascular testing is  suggested.     Rollene Rotunda, MD, [facility]  Electronically Signed    JH/MedQ  DD: 04/19/2007  DT: 04/20/2007  Job #: [id number]   cc:   Titus Dubin. Alwyn Ren, MD,FACP,FCCP

## 2010-07-13 NOTE — Assessment & Plan Note (Signed)
[facility]                            CARDIOLOGY OFFICE NOTE   Herman, Scott                     MRN:          [id number]  DATE:03/27/2007                            DOB:          [birth year]    PRIMARY CARE PHYSICIAN:  Titus Dubin. Alwyn Ren, MD, FACP, FCCP   REASON FOR REFERRAL:  Evaluate palpations.   HISTORY OF PRESENT ILLNESS:  The patient is a very pleasant 77 year old  gentleman without significant past history.  He did have some  palpitations years ago.  He reports having stress test in the past.  He  also had an echocardiogram 2002, which was normal.  He is a long  distance biker.  He does not have any problems with this.  He has not  done that much of this winter.  He denies any chest discomfort, neck or  arm discomfort with this.  He has had no shortness of breath.  Denies  any PND or orthopnea.   The patient has been having palpitations.  This has been going on for  years.  However, he was having more frequent ectopy about 3 weeks ago.  Describes the skipped beats rather than sustained tachy arrhythmias.  He  does not notice it easily with any particular activity or time of day.  He did cut back on caffeine without improvement.   The patient has had no syncope.  He has had no other associated symptoms  as described above.   PAST MEDICAL HISTORY:  1. Diabetes mellitus since 1993.  2. Hyperlipidemia since 1996.   PAST SURGICAL HISTORY:  1. Cholecystectomy.  2. Broken collarbone.   ALLERGIES:  None.   CURRENT MEDICATIONS:  1. Pravastatin 80 mg daily.  2. Zetia 10 mg daily.  3. Actos 45 mg daily.  4. Aspirin 81 mg daily.  5. Januvia 100 mg daily.  6. Metformin 75 mg daily.  7. Glipizide 10 mg daily.   SOCIAL HISTORY:  Patient is retired.  Married.  He has three children,  four grandchildren.  He does not smoke cigarettes or drink alcohol.   FAMILY HISTORY:  Is noncontributory for early coronary artery disease,  sudden  cardiac death, or heart failure.   REVIEW OF SYSTEMS:  As stated in the HPI and negative for other systems.   PHYSICAL EXAMINATION:  The patient is very well-appearing looks younger  than stated age.  Blood pressure 122/72, heart rate4 74 and regular,  body mass index 24, weight 173 pounds.  HEENT: Eyelids unremarkable. Pupils equal, round, and reactive to light.  Fundi within normal limits. Oral mucosa is unremarkable.  NECK: No jugular venous distention at 45 degrees. Carotid upstroke brisk  and symmetrical. No bruits, no thyromegaly.  LYMPHATICS: No cervical, axillary or inguinal adenopathy.  LUNGS: Clear to auscultation bilaterally.  BACK: No costovertebral angle tenderness.  CHEST: Unremarkable.  HEART: PMI not displaced or sustained. S1, S2 within normal limits. No  S3. No S4. No clicks, rub or murmurs.  ABDOMEN: Flat, positive bowel sounds, normal in frequency and pitch. No  bruits. No rebounds. No guarding. No midline pulsatile mass. No  hepatomegaly, splenomegaly.  SKIN: No rashes, no nodules.  EXTREMITIES: 2+ pulses throughout. No edema. No cyanosis or clubbing.  NEURO: Oriented to person, place and time. Cranial nerves II-XII grossly  intact. Motor grossly intact.   EKG sinus rhythm, rate 74, leftward axis, intervals within normal  limits, no acute ST wave change.   ASSESSMENT/PLAN:  1. Palpitations.  The patient has had palpitations that were more      frequent recently.  Actually seem to have gotten better.  He did      wear a Holter monitor which demonstrated occasional PVCs.  He has      structurally normal heart by physical exam and by a previous echo.      At this point in time, I would suspect that these are of benign      arrhythmia and no further testing would be warranted.  We discussed      the possibility of managing these with a calcium blocker or beta      blocker.  First, he is going to continue avoid caffeine and let me      know if these get worse as  are not particular problematic.  2. Risk stratification.  The patient has longstanding diabetes.  He      has not had a treadmill in many years.  I think it is very prudent      to do an exercise treadmill test.  This will also me to give him      some guidance on heart rate with exercise.  This will risk      stratified.  It also was reasonable test for screening for      obstructive coronary disease when the pretest probability is low.  3. Diabetes, per Dr. Leslie Dales.  4. Hyperlipidemia.  The patient is managed with pravastatin.  This      followed by Dr. Alwyn Ren and      Dr. Leslie Dales.  5. Follow-up.  I will see the patient again at the time of his      treadmill.     Rollene Rotunda, MD, [facility]  Electronically Signed    JH/MedQ  DD: 03/27/2007  DT: 03/27/2007  Job #: [id number]   cc:   Titus Dubin. Alwyn Ren, MD,FACP,FCCP
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
## 2011-02-05 ENCOUNTER — Other Ambulatory Visit: Payer: Self-pay | Admitting: Gastroenterology

## 2011-02-14 ENCOUNTER — Encounter: Payer: Self-pay | Admitting: Gastroenterology

## 2011-02-14 ENCOUNTER — Ambulatory Visit (INDEPENDENT_AMBULATORY_CARE_PROVIDER_SITE_OTHER): Payer: Medicare Other | Admitting: Gastroenterology

## 2011-02-14 VITALS — BP 96/52 | HR 88 | Ht 71.0 in | Wt 179.6 lb

## 2011-02-14 DIAGNOSIS — K219 Gastro-esophageal reflux disease without esophagitis: Secondary | ICD-10-CM

## 2011-02-14 DIAGNOSIS — Z1211 Encounter for screening for malignant neoplasm of colon: Secondary | ICD-10-CM

## 2011-02-14 MED ORDER — DEXLANSOPRAZOLE 60 MG PO CPDR: 60.0000 mg | DELAYED_RELEASE_CAPSULE | Freq: Every day | ORAL | Status: DC

## 2011-02-14 NOTE — Patient Instructions (Signed)
You have been given a year of refills on your Dexilant. You may try 2 OTC Prilosec every morning and continue that if it controls your symptoms and discontinue the Dexilant. You have been put in our recall system for a repeat colonoscopy for 02/2012. Copy to Dr Alwyn Ren.

## 2011-02-14 NOTE — Progress Notes (Signed)
History of Present Illness: This is a 77 year old male with GERD returning for followup. Reflux symptoms are under excellent control on Dexilant. He went to change to less expensive medication if possible. He underwent evaluation including upper endoscopy and a gastric emptying scan in 2011. At that time his symptoms were not under good control on either Prevacid 30 mg daily or omeprazole 40 mg daily. Denies weight loss, abdominal pain, constipation, diarrhea, change in stool caliber, melena, hematochezia, nausea, vomiting, dysphagia, chest pain.  Current Medications, Allergies, Past Medical History, Past Surgical History, Family History and Social History were reviewed in [employer] record.  Physical Exam: General: Well developed , well nourished, no acute distress Head: Normocephalic and atraumatic Eyes:  sclerae anicteric, EOMI Ears: Normal auditory acuity Mouth: No deformity or lesions Lungs: Clear throughout to auscultation Heart: Regular rate and rhythm; no murmurs, rubs or bruits Abdomen: Soft, non tender and non distended. No masses, hepatosplenomegaly or hernias noted. Normal Bowel sounds Musculoskeletal: Symmetrical with no gross deformities  Pulses:  Normal pulses noted Extremities: No clubbing, cyanosis, edema or deformities noted Neurological: Alert oriented x 4, grossly nonfocal Psychological:  Alert and cooperative. Normal mood and affect  Assessment and Recommendations:  1. GERD. Refilled Dexilant 60 mg daily. He may try omeprazole 40 mg daily in its place and if he has adequate symptom control he may remain on omeprazole.  2. Colorectal cancer screening. Average risk. Colonoscopy recommended January 2014

## 2011-02-17 ENCOUNTER — Other Ambulatory Visit: Payer: Self-pay | Admitting: Gastroenterology

## 2011-02-17 MED ORDER — DEXLANSOPRAZOLE 60 MG PO CPDR: 60.0000 mg | DELAYED_RELEASE_CAPSULE | Freq: Every day | ORAL | Status: DC

## 2011-02-17 NOTE — Telephone Encounter (Signed)
Called pt to inform that medication was sent to [facility]

## 2011-03-01 DIAGNOSIS — R9439 Abnormal result of other cardiovascular function study: Secondary | ICD-10-CM

## 2011-03-01 HISTORY — DX: Abnormal result of other cardiovascular function study: R94.39

## 2011-03-24 ENCOUNTER — Institutional Professional Consult (permissible substitution): Payer: Medicare Other | Admitting: Cardiovascular Disease

## 2011-03-25 ENCOUNTER — Ambulatory Visit (INDEPENDENT_AMBULATORY_CARE_PROVIDER_SITE_OTHER): Payer: Medicare Other | Admitting: Nurse Practitioner

## 2011-03-25 ENCOUNTER — Encounter: Payer: Self-pay | Admitting: Nurse Practitioner

## 2011-03-25 VITALS — BP 108/78 | HR 80 | Ht 71.0 in | Wt 179.0 lb

## 2011-03-25 DIAGNOSIS — R030 Elevated blood-pressure reading, without diagnosis of hypertension: Secondary | ICD-10-CM

## 2011-03-25 DIAGNOSIS — Z9189 Other specified personal risk factors, not elsewhere classified: Secondary | ICD-10-CM

## 2011-03-25 DIAGNOSIS — R609 Edema, unspecified: Secondary | ICD-10-CM

## 2011-03-25 DIAGNOSIS — IMO0001 Reserved for inherently not codable concepts without codable children: Secondary | ICD-10-CM

## 2011-03-25 DIAGNOSIS — I10 Essential (primary) hypertension: Secondary | ICD-10-CM

## 2011-03-25 LAB — BASIC METABOLIC PANEL
BUN: 16 mg/dL (ref 6–23)
CO2: 28 mEq/L (ref 19–32)
Calcium: 9 mg/dL (ref 8.4–10.5)
Chloride: 100 mEq/L (ref 96–112)
Creatinine, Ser: 1 mg/dL (ref 0.4–1.5)
GFR: 80 mL/min (ref >60.00)
Glucose, Bld: 160 mg/dL — ABNORMAL HIGH (ref 70–99)
Potassium: 4.5 mEq/L (ref 3.5–5.1)
Sodium: 134 mEq/L — ABNORMAL LOW (ref 135–145)

## 2011-03-25 MED ORDER — LISINOPRIL 2.5 MG PO TABS: 2.5000 mg | ORAL_TABLET | Freq: Every day | ORAL | Status: DC

## 2011-03-25 NOTE — Assessment & Plan Note (Signed)
He does have several cardiovascular risk factors. Now with increased blood pressures. He is diabetic and has dyslipidemia. I am not sure what to make of the this throbbing in his fingers with exertion. We will arrange for ETT for further evaluation.

## 2011-03-25 NOTE — Assessment & Plan Note (Signed)
I do not see any visible signs of edema today. He notes this correlates with changing to generic Actos. I have asked him to speak with Dr. Leslie Dales.  I have encouraged support stockings prn.

## 2011-03-25 NOTE — Assessment & Plan Note (Signed)
Recheck of his blood pressure by me is 118/70 in the left arm and 124/70 in the right arm. He is diabetic. His readings at home are elevated. We will add low dose Lisinopril at 2.5 mg daily. First dose tonight and then daily. He will continue to monitor his blood pressure at home and bring his readings in for review. We will check a baseline BMET today. Patient is agreeable to this plan and will call if any problems develop in the interim.
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
## 2011-03-25 NOTE — Patient Instructions (Signed)
Talk to Dr. Leslie Dales about your generic for Actos and your feet swelling.  We are going to check your kidney function today.   We are going to put you on Lisinopril 2.5 mg each day. Take the first dose tonight and then one each day.  Continue to monitor your blood pressure at home.   We will see you back in about 3 weeks to review.  Call the [facility] office at [phone] if you have any questions, problems or concerns.

## 2011-03-28 ENCOUNTER — Institutional Professional Consult (permissible substitution): Payer: Medicare Other | Admitting: Cardiovascular Disease

## 2011-03-30 ENCOUNTER — Other Ambulatory Visit: Payer: Self-pay | Admitting: Nurse Practitioner

## 2011-03-30 ENCOUNTER — Ambulatory Visit (INDEPENDENT_AMBULATORY_CARE_PROVIDER_SITE_OTHER): Payer: Medicare Other | Admitting: Nurse Practitioner

## 2011-03-30 ENCOUNTER — Encounter: Payer: Self-pay | Admitting: Nurse Practitioner

## 2011-03-30 VITALS — BP 116/69 | HR 100 | Ht 71.0 in | Wt 179.0 lb

## 2011-03-30 DIAGNOSIS — R030 Elevated blood-pressure reading, without diagnosis of hypertension: Secondary | ICD-10-CM

## 2011-03-30 DIAGNOSIS — IMO0002 Reserved for concepts with insufficient information to code with codable children: Secondary | ICD-10-CM

## 2011-03-30 DIAGNOSIS — R9439 Abnormal result of other cardiovascular function study: Secondary | ICD-10-CM

## 2011-03-30 DIAGNOSIS — R079 Chest pain, unspecified: Secondary | ICD-10-CM

## 2011-03-30 DIAGNOSIS — Z9189 Other specified personal risk factors, not elsewhere classified: Secondary | ICD-10-CM

## 2011-03-30 DIAGNOSIS — Z01818 Encounter for other preprocedural examination: Secondary | ICD-10-CM

## 2011-03-30 DIAGNOSIS — I10 Essential (primary) hypertension: Secondary | ICD-10-CM

## 2011-03-30 NOTE — Progress Notes (Signed)
Exercise Treadmill Test  Pre-Exercise Testing Evaluation Rhythm: normal sinus  Rate: 100   PR:  17 QRS:  .08  QT:  .36 QTc: .47     Test  Exercise Tolerance Test Ordering MD: Angelina Sheriff, MD  Interpreting MD:  Norma Fredrickson NP  Unique Test No: 1  Treadmill:  1  Indication for ETT: HTN  Contraindication to ETT: No   Stress Modality: exercise - treadmill  Cardiac Imaging Performed: non   Protocol: standard Bruce - maximal  Max BP:  178/79  Max MPHR (bpm):  145 85% MPR (bpm):  123  MPHR obtained (bpm):  164 % MPHR obtained:  113%  Reached 85% MPHR (min:sec):  1:20 Total Exercise Time (min-sec):  7:00  Workload in METS:  8.5 Borg Scale: 14  Reason ETT Terminated:  patient's desire to stop    ST Segment Analysis At Rest: normal ST segments - no evidence of significant ST depression With Exercise: significant ischemic ST depression  Other Information Arrhythmia:  No Angina during ETT:  absent (0) Quality of ETT:  diagnostic  ETT Interpretation:  abnormal - evidence of ST depression consistent with ischemia  Comments: Patient was exercised on the standard Bruce protocol for a total of 7 minutes. He met his target within one minute of walking. Adequate blood pressure response. Clinically negative. EKG was positive. Tracings are reviewed with Dr. Antoine Poche.  Recommendations: Patient was subsequently seen with Dr. Antoine Poche. His ETT is abnormal. This is a new finding compared to 4 years ago. Cardiac catheterization is recommended.

## 2011-03-30 NOTE — Patient Instructions (Signed)
You did not pass your stress test today. It does suggest that you may have blockages.  We need to do a heart catheterization. This will be on Friday with Dr. Antoine Poche.  We will check your labs today. Go to [facility] at [facility] and get your chest xray tomorrow between 8am and 4pm. You may walk in.  You are scheduled for an outpatient cardiac catheterization on Friday, February 1st with Dr. Antoine Poche or associates.  Go the [facility] at [facility] on Friday at 6:30am.  Call the Heart & Vascular Center at [phone] if you are unable to make your appointment.  The code to get into the parking garage under the building is 0600. You must have someone available to drive you home. Someone needs to be with you for the first 24 hours after you arrive home. Please wear clothes that are easy to get on and off.  Do not eat or drink after midnight on Thursday. Do not take your medicines on Friday morning but bring them with you. You can take them after your procedure. Take only half dose of your insulin on Thursday night.  Do not take any more Metformin.   Coronary Angiography Coronary angiography is an X-ray procedure used to look at the arteries in the heart. In this procedure, a dye is injected through a long, hollow tube (catheter). The catheter is about the size of a piece of cooked spaghetti. The catheter injects a dye into an artery in your groin. X-rays are then taken to show if there is a blockage in the arteries of your heart. BEFORE THE PROCEDURE   Let your caregiver know if you have allergies to shellfish or contrast dye. Also let your caregiver know if you have kidney problems or failure.   Do not eat or drink starting from midnight up to the time of the procedure, or as directed.   You may drink enough water to take your medications the morning of the procedure if you were instructed to do so.   You should be at the hospital or outpatient facility where  the procedure is to be done 60 minutes prior to the procedure or as directed.  PROCEDURE  You may be given an IV medication to help you relax before the procedure.   You will be prepared for the procedure by washing and shaving the area where the catheter will be inserted. This is usually done in the groin but may be done in the fold of your arm by your elbow.   A medicine will be given to numb your groin where the catheter will be inserted.   A specially trained doctor will insert the catheter into an artery in your groin. The catheter is guided by using a special type of X-ray (fluoroscopy) to the blood vessel being examined.   A special dye is then injected into the catheter and X-rays are taken. The dye helps to show where any narrowing or blockages are located in the heart arteries.  AFTER THE PROCEDURE   After the procedure you will be kept in bed lying flat for several hours. You will be instructed to not bend or cross your legs.   The groin insertion site will be watched and checked frequently.   The pulse in your feet will be checked frequently.   Additional blood tests, X-rays and an EKG may be done.   You may stay in the hospital overnight for observation.  SEEK IMMEDIATE MEDICAL CARE  IF:   You develop chest pain, shortness of breath, feel faint, or pass out.   There is bleeding, swelling, or drainage from the catheter insertion site.   You develop pain, discoloration, coldness, or severe bruising in the leg or area where the catheter was inserted.   You have a fever.  Document Released: 08/21/2002 Document Revised: 10/27/2010 Document Reviewed: 10/10/2007 [facility] Patient Information 2012 [place], [place].

## 2011-03-30 NOTE — Assessment & Plan Note (Signed)
He has multiple cardiovascular risk factors. He has had his ETT today. This is positive for ischemia. We will make arrangements for cardiac catheterization on Friday with Dr. Antoine Poche in the [facility]. The procedure is reviewed in detail and he is willing to proceed. Labs will be done today. CXR tomorrow. Will go ahead and hold his metformin. Will do 1/2 dose insulin tomorrow night. Patient is agreeable to this plan and will call if any problems develop in the interim.

## 2011-03-31 ENCOUNTER — Ambulatory Visit
Admission: RE | Admit: 2011-03-31 | Discharge: 2011-03-31 | Disposition: A | Discharge status: Home / Self Care | Payer: Medicare Other | Source: Ambulatory Visit | Attending: Nurse Practitioner | Admitting: Nurse Practitioner

## 2011-03-31 DIAGNOSIS — I10 Essential (primary) hypertension: Secondary | ICD-10-CM

## 2011-03-31 DIAGNOSIS — R9439 Abnormal result of other cardiovascular function study: Secondary | ICD-10-CM

## 2011-03-31 DIAGNOSIS — IMO0002 Reserved for concepts with insufficient information to code with codable children: Secondary | ICD-10-CM

## 2011-03-31 LAB — CBC WITH DIFFERENTIAL/PLATELET
Basophils Absolute: 0 10*3/uL (ref 0.0–0.1)
Basophils Relative: 0.3 % (ref 0.0–3.0)
Eosinophils Absolute: 0.1 10*3/uL (ref 0.0–0.7)
Eosinophils Relative: 1 % (ref 0.0–5.0)
HCT: 38.9 % — ABNORMAL LOW (ref 39.0–52.0)
Hemoglobin: 13.4 g/dL (ref 13.0–17.0)
Lymphocytes Relative: 20.1 % (ref 12.0–46.0)
Lymphs Abs: 1.6 10*3/uL (ref 0.7–4.0)
MCHC: 34.4 g/dL (ref 30.0–36.0)
MCV: 89.5 fl (ref 78.0–100.0)
Monocytes Absolute: 0.5 10*3/uL (ref 0.1–1.0)
Monocytes Relative: 6.5 % (ref 3.0–12.0)
Neutro Abs: 5.7 10*3/uL (ref 1.4–7.7)
Neutrophils Relative %: 72.1 % (ref 43.0–77.0)
Platelets: 239 10*3/uL (ref 150.0–400.0)
RBC: 4.35 Mil/uL (ref 4.22–5.81)
RDW: 13.8 % (ref 11.5–14.6)
WBC: 7.9 10*3/uL (ref 4.5–10.5)

## 2011-03-31 LAB — BASIC METABOLIC PANEL
BUN: 19 mg/dL (ref 6–23)
CO2: 26 mEq/L (ref 19–32)
Calcium: 9.2 mg/dL (ref 8.4–10.5)
Chloride: 103 mEq/L (ref 96–112)
Creatinine, Ser: 1 mg/dL (ref 0.4–1.5)
GFR: 73.82 mL/min (ref >60.00)
Glucose, Bld: 109 mg/dL — ABNORMAL HIGH (ref 70–99)
Potassium: 3.8 mEq/L (ref 3.5–5.1)
Sodium: 137 mEq/L (ref 135–145)

## 2011-03-31 LAB — APTT: aPTT: 30.1 s — ABNORMAL HIGH (ref 21.7–28.8)

## 2011-03-31 LAB — PROTIME-INR
INR: 1 ratio (ref 0.8–1.0)
Prothrombin Time: 10.7 s (ref 10.2–12.4)

## 2011-04-01 ENCOUNTER — Inpatient Hospital Stay (HOSPITAL_BASED_OUTPATIENT_CLINIC_OR_DEPARTMENT_OTHER)
Admission: RE | Admit: 2011-04-01 | Discharge: 2011-04-01 | Disposition: A | Discharge status: Home / Self Care | Payer: Medicare Other | Source: Ambulatory Visit | Attending: Cardiology | Admitting: Cardiology

## 2011-04-01 ENCOUNTER — Encounter (HOSPITAL_BASED_OUTPATIENT_CLINIC_OR_DEPARTMENT_OTHER)
Admission: RE | Disposition: A | Discharge status: Home / Self Care | Payer: Self-pay | Source: Ambulatory Visit | Attending: Cardiology

## 2011-04-01 ENCOUNTER — Telehealth: Payer: Self-pay | Admitting: Nurse Practitioner

## 2011-04-01 ENCOUNTER — Encounter (HOSPITAL_BASED_OUTPATIENT_CLINIC_OR_DEPARTMENT_OTHER): Payer: Self-pay | Admitting: *Deleted

## 2011-04-01 DIAGNOSIS — R9439 Abnormal result of other cardiovascular function study: Secondary | ICD-10-CM | POA: Insufficient documentation

## 2011-04-01 DIAGNOSIS — E119 Type 2 diabetes mellitus without complications: Secondary | ICD-10-CM | POA: Insufficient documentation

## 2011-04-01 DIAGNOSIS — I2510 Atherosclerotic heart disease of native coronary artery without angina pectoris: Secondary | ICD-10-CM | POA: Insufficient documentation

## 2011-04-01 DIAGNOSIS — Z01818 Encounter for other preprocedural examination: Secondary | ICD-10-CM

## 2011-04-01 DIAGNOSIS — K219 Gastro-esophageal reflux disease without esophagitis: Secondary | ICD-10-CM | POA: Insufficient documentation

## 2011-04-01 DIAGNOSIS — E785 Hyperlipidemia, unspecified: Secondary | ICD-10-CM | POA: Insufficient documentation

## 2011-04-01 DIAGNOSIS — R9430 Abnormal result of cardiovascular function study, unspecified: Secondary | ICD-10-CM

## 2011-04-01 HISTORY — PX: CARDIAC CATHETERIZATION: SHX172

## 2011-04-01 LAB — POCT I-STAT GLUCOSE: Glucose, Bld: 120 mg/dL — ABNORMAL HIGH (ref 70–99)

## 2011-04-01 SURGERY — JV LEFT HEART CATHETERIZATION WITH CORONARY ANGIOGRAM
Anesthesia: Moderate Sedation

## 2011-04-01 MED ORDER — SODIUM CHLORIDE 0.9 % IV SOLN: 250.0000 mL | INTRAVENOUS | Status: DC | PRN

## 2011-04-01 MED ORDER — SODIUM CHLORIDE 0.9 % IV SOLN
INTRAVENOUS | Status: DC
  Administered 2011-04-01: 07:00:00 via INTRAVENOUS

## 2011-04-01 MED ORDER — ASPIRIN 81 MG PO CHEW
324.0000 mg | CHEWABLE_TABLET | ORAL | Status: AC
  Administered 2011-04-01: 324 mg via ORAL

## 2011-04-01 MED ORDER — SODIUM CHLORIDE 0.9 % IJ SOLN: 3.0000 mL | Freq: Two times a day (BID) | INTRAMUSCULAR | Status: DC

## 2011-04-01 MED ORDER — SODIUM CHLORIDE 0.9 % IJ SOLN: 3.0000 mL | INTRAMUSCULAR | Status: DC | PRN

## 2011-04-01 MED ORDER — DIAZEPAM 5 MG PO TABS
10.0000 mg | ORAL_TABLET | ORAL | Status: AC
  Administered 2011-04-01: 10 mg via ORAL

## 2011-04-01 NOTE — Telephone Encounter (Signed)
New problem Pt wants to talk to you about some  meds he is taking. He has some questions. Please call

## 2011-04-01 NOTE — Progress Notes (Signed)
Bedrest begins @ 0830. 

## 2011-04-01 NOTE — Progress Notes (Signed)
Discharge instructions completed, ambulated to bathroom without bleeding, discharged to home via wheelchair with wife home.

## 2011-04-01 NOTE — Telephone Encounter (Signed)
Pt wanted to clarify orders not no take he Metformin.  He states understanding.

## 2011-04-01 NOTE — Procedures (Signed)
  Cardiac Catheterization Procedure Note  Name: Scott Herman MRN: [id number] DOB: 1933/10/18  Procedure: Left Heart Cath, Selective Coronary Angiography, LV angiography  Indication:   Abnormal ETT   Procedural details: The right groin was prepped, draped, and anesthetized with 1% lidocaine. Using modified Seldinger technique, a 4 French sheath was introduced into the right femoral artery. Standard Judkins catheters were used for coronary angiography and left ventriculography. Catheter exchanges were performed over a guidewire. There were no immediate procedural complications. The patient was transferred to the post catheterization recovery area for further monitoring.  Procedural Findings:  Hemodynamics:     AO 151/75    LV 156/20   Coronary angiography:  Coronary dominance: Right  Left mainstem:   Normal  Left anterior descending (LAD):   Large wrapping the apex. Diffuse luminal irregularities. Mid diagonal large and normal.  Left circumflex (LCx):  AV groove normal. Large ramus intermediate normal. Small obtuse marginals x2 normal. Posterior lateral moderate sized and normal. Second posterior lateral large and normal.  Right coronary artery (RCA):  Large vessel with proximal diffuse luminal irregularities. Mid 25% stenosis. Distal 25% stenosis before the PDA. PDA was moderate-sized with ostial 25% stenosis. There are small posterior laterals.  Left ventriculography: Left ventricular systolic function is normal, LVEF is estimated at 55-65%, there is no significant mitral regurgitation   Final Conclusions:  Mild nonobstructive coronary disease. Normal left ventricular function.  Recommendations: No further cardiovascular testing. The patient had a false positive exercise treadmill test. Further evaluation can be per his primary physician.  Rollene Rotunda 04/01/2011, 8:12 AM
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
## 2011-04-01 NOTE — Interval H&P Note (Signed)
History and Physical Interval Note:  04/01/2011 7:48 AM  Scott Herman  has presented today for surgery, with the diagnosis of abn stress test  The various methods of treatment have been discussed with the patient and family. After consideration of risks, benefits and other options for treatment, the patient has consented to  Procedure(s): JV LEFT HEART CATHETERIZATION WITH CORONARY ANGIOGRAM as a surgical intervention .  The patients' history has been reviewed, patient examined, no change in status, stable for surgery.  I have reviewed the patients' chart and labs.  Questions were answered to the patient's satisfaction.     Rollene Rotunda

## 2011-04-19 ENCOUNTER — Encounter: Payer: Self-pay | Admitting: Nurse Practitioner

## 2011-04-19 ENCOUNTER — Ambulatory Visit (INDEPENDENT_AMBULATORY_CARE_PROVIDER_SITE_OTHER): Payer: Medicare Other | Admitting: Nurse Practitioner

## 2011-04-19 VITALS — BP 92/60 | HR 84 | Ht 71.0 in | Wt 178.0 lb

## 2011-04-19 DIAGNOSIS — R9439 Abnormal result of other cardiovascular function study: Secondary | ICD-10-CM

## 2011-04-19 DIAGNOSIS — IMO0001 Reserved for inherently not codable concepts without codable children: Secondary | ICD-10-CM

## 2011-04-19 DIAGNOSIS — R030 Elevated blood-pressure reading, without diagnosis of hypertension: Secondary | ICD-10-CM

## 2011-04-19 MED ORDER — LISINOPRIL 2.5 MG PO TABS: 2.5000 mg | ORAL_TABLET | Freq: Every day | ORAL | Status: DC

## 2011-04-19 NOTE — Assessment & Plan Note (Signed)
He has satisfactory blood pressure readings at home. Will continue with his current medicines. I have sent a prescription for his ACE to [facility]. He will continue to monitor at home.

## 2011-04-19 NOTE — Assessment & Plan Note (Signed)
He is now s/p cardiac cath showing only mild nonobstructive CAD. LV function is normal. His stress test was felt to be false positive. He is felt to be stable from our standpoint. We will see him back on an as needed basis and will otherwise defer his management to his PCP. Patient is agreeable to this plan and will call if any problems develop in the interim.

## 2011-04-19 NOTE — Patient Instructions (Signed)
You are doing well from our standpoint.  Stay on your current medicines.  We will see you back as needed.  Call the [facility] office at [phone] if you have any questions, problems or concerns.

## 2011-04-19 NOTE — Progress Notes (Signed)
[name] Date of Birth: 04-03-1933 Medical Record #[id number]  History of Present Illness: Mr. Scarano is seen back today for a follow up visit. He is seen for Dr. Antoine Poche. He has had recent abnormal stress testing here in the office and had cardiac catheterization showing only mild nonobstructive CAD. His stress test was felt to be false positive. He does remain on his ACE for his blood pressure. His readings from home are reviewed and look ok. They are averaging 130 to 140 systolic. No chest pain. He feels good. No problems with his groin.   Current Outpatient Prescriptions on File Prior to Visit  Medication Sig Dispense Refill  . aspirin 81 MG tablet Take 81 mg by mouth daily.        [name] Kitchen co-enzyme Q-10 30 MG capsule Take 30 mg by mouth daily.        [name] Kitchen dexlansoprazole (DEXILANT) 60 MG capsule Take 1 capsule (60 mg total) by mouth daily.  90 capsule  3  . insulin glargine (LANTUS) 100 UNIT/ML injection Inject 14 Units into the skin at bedtime.       [name] Kitchen linagliptin (TRADJENTA) 5 MG TABS tablet Take 5 mg by mouth daily.      . metFORMIN (GLUCOPHAGE-XR) 750 MG 24 hr tablet Take 750 mg by mouth 2 (two) times daily.        . Multiple Vitamin (MULTIVITAMIN) tablet Take 1 tablet by mouth daily.        [name] Kitchen OLIVE LEAF EXTRACT PO Take by mouth. I tablespoon once daily       . pioglitazone (ACTOS) 45 MG tablet Take 45 mg by mouth daily.        . pravastatin (PRAVACHOL) 80 MG tablet Take 80 mg by mouth daily.        . Tamsulosin HCl (FLOMAX) 0.4 MG CAPS Take 0.4 mg by mouth daily.        [name] Kitchen DISCONTD: lisinopril (ZESTRIL) 2.5 MG tablet Take 1 tablet (2.5 mg total) by mouth daily.  30 tablet  6    No Known Allergies  Past Medical History  Diagnosis Date  . Palpitations     Negative holter in 2009 except for tachycardia with bicycling  . Diabetes mellitus, type 2   . Diverticulosis   . Hiatal hernia   . Hyperlipidemia   . GERD (gastroesophageal reflux disease)   . Normal cardiac stress test  Jan 2009    Normal ETT  . Abnormal stress electrocardiogram test Jan 2013    Negative cardiac catheterization.     Past Surgical History  Procedure Date  . Cholecystectomy   . Inguinal hernia repair   . Hemorrhoid surgery   . Orif clavicular fracture     left  . Tonsillectomy   . Cardiac catheterization     In the 70's reportedly normal. Had cut down of the brachial artery.   . Cardiac catheterization Feb 2013    Mild nonobstructive CAD. Normal LV function    History  Smoking status  . Never Smoker   Smokeless tobacco  . Never Used    History  Alcohol Use No    Family History  Problem Relation Age of Onset  . Prostate cancer Father   . Diabetes Father   . Diverticulosis Father   . Prostate cancer Paternal Grandfather   . Stroke Mother   . Diabetes Paternal Grandmother   . Colon cancer Neg Hx   . Hypertension Brother   . Diabetes Brother  Review of Systems: The review of systems is per the HPI.  All other systems were reviewed and are negative.  Physical Exam: BP 92/60  Pulse 84  Ht 5\' 11"  (1.803 m)  Wt 178 lb (80.74 kg)  BMI 24.83 kg/m2 Patient is very pleasant and in no acute distress. Skin is warm and dry. Color is normal.  HEENT is unremarkable. Normocephalic/atraumatic. PERRL. Sclera are nonicteric. Neck is supple. No masses. No JVD. Lungs are clear. Cardiac exam shows a regular rate and rhythm. Abdomen is soft. Extremities are without edema. Gait and ROM are intact. No gross neurologic deficits noted.  LABORATORY DATA:   Assessment / Plan:

## 2012-01-31 ENCOUNTER — Encounter: Payer: Self-pay | Admitting: Gastroenterology

## 2012-04-10 ENCOUNTER — Encounter: Payer: Self-pay | Admitting: Gastroenterology

## 2012-04-24 ENCOUNTER — Ambulatory Visit (AMBULATORY_SURGERY_CENTER): Payer: Medicare Other

## 2012-04-24 VITALS — Ht 71.5 in | Wt 181.8 lb

## 2012-04-24 DIAGNOSIS — Z1211 Encounter for screening for malignant neoplasm of colon: Secondary | ICD-10-CM

## 2012-04-24 MED ORDER — MOVIPREP 100 G PO SOLR: ORAL | Status: DC

## 2012-04-25 ENCOUNTER — Encounter: Payer: Self-pay | Admitting: Gastroenterology

## 2012-04-25 ENCOUNTER — Telehealth: Payer: Self-pay | Admitting: Gastroenterology

## 2012-04-25 MED ORDER — ESOMEPRAZOLE MAGNESIUM 40 MG PO CPDR: 40.0000 mg | DELAYED_RELEASE_CAPSULE | Freq: Every day | ORAL | Status: AC

## 2012-04-25 NOTE — Telephone Encounter (Signed)
Patient states Nexium is on his formulary now and would like a new prescription for this. Told him he needed to schedule an office visit soon since it has been over a year. Patient states he is scheduled to to come in for a Colonoscopy in March. Told him I will send Nexium to his mail order pharmacy.

## 2012-04-27 ENCOUNTER — Telehealth: Payer: Self-pay | Admitting: Gastroenterology

## 2012-04-30 ENCOUNTER — Other Ambulatory Visit: Payer: Self-pay

## 2012-04-30 DIAGNOSIS — Z1211 Encounter for screening for malignant neoplasm of colon: Secondary | ICD-10-CM

## 2012-04-30 MED ORDER — MOVIPREP 100 G PO SOLR: ORAL | Status: DC

## 2012-05-01 ENCOUNTER — Telehealth: Payer: Self-pay

## 2012-05-01 NOTE — Telephone Encounter (Signed)
Patient aware of Dr.Hopper's response and verbalized understanding

## 2012-05-01 NOTE — Telephone Encounter (Signed)
Message copied by Maurice Small on Tue May 01, 2012  3:38 PM ------      Message from: Pecola Lawless      Created: Tue May 01, 2012  2:57 PM      Contact: [phone]       [facility] now employs full time Hospitalists       ----- Message -----         From: Maurice Small, CMA         Sent: 05/01/2012   2:04 PM           To: Pecola Lawless, MD                        ----- Message -----         From: Marshell Garfinkel         Sent: 04/27/2012  12:26 PM           To: Maurice Small, CMA            Does Dr. Alwyn Ren have admitting privileges? This patient states he is having a colonoscopy on 3/11 and he was told that if something went wrong Dr. Alwyn Ren would have to be the one to admit him. Please advise.       ------

## 2012-05-01 NOTE — Telephone Encounter (Signed)
MoviPrep sent to Rx again.

## 2012-05-08 ENCOUNTER — Ambulatory Visit (AMBULATORY_SURGERY_CENTER): Payer: Medicare Other | Admitting: Gastroenterology

## 2012-05-08 ENCOUNTER — Other Ambulatory Visit: Payer: Self-pay | Admitting: Gastroenterology

## 2012-05-08 ENCOUNTER — Encounter: Payer: Self-pay | Admitting: Gastroenterology

## 2012-05-08 VITALS — BP 143/79 | HR 79 | Temp 97.0°F | Resp 26 | Ht 71.0 in | Wt 181.0 lb

## 2012-05-08 DIAGNOSIS — Z1211 Encounter for screening for malignant neoplasm of colon: Secondary | ICD-10-CM

## 2012-05-08 LAB — GLUCOSE, CAPILLARY
Glucose-Capillary: 87 mg/dL (ref 70–99)
Glucose-Capillary: 201 mg/dL — ABNORMAL HIGH (ref 70–99)

## 2012-05-08 MED ORDER — DEXTROSE 5 % IV SOLN: INTRAVENOUS | Status: DC

## 2012-05-08 NOTE — Progress Notes (Signed)
Report to pacu rn, vss, bbs=clear 

## 2012-05-08 NOTE — Progress Notes (Signed)
Pulse rate rechecked, pt. Lying on stretcher, rate 76.

## 2012-05-08 NOTE — Op Note (Signed)
[facility] [address] [place], [address]   COLONOSCOPY PROCEDURE REPORT  PATIENT: Scott, Herman  MR#: [id number] BIRTHDATE: [birth year] , 79  yrs. old GENDER: Male ENDOSCOPIST: Meryl Dare, MD, [facility] PROCEDURE DATE:  05/08/2012 PROCEDURE:   Colonoscopy, screening ASA CLASS:   Class II INDICATIONS:average risk screening. MEDICATIONS: MAC sedation, administered by CRNA and propofol (Diprivan) 230mg  IV DESCRIPTION OF PROCEDURE:   After the risks benefits and alternatives of the procedure were thoroughly explained, informed consent was obtained.  A digital rectal exam revealed no abnormalities of the rectum.   The LB [id number]  endoscope was introduced through the anus and advanced to the cecum, which was identified by both the appendix and ileocecal valve. No adverse events experienced.   The quality of the prep was good, using MoviPrep  The instrument was then slowly withdrawn as the colon was fully examined.  COLON FINDINGS: Mild diverticulosis was noted in the sigmoid colon. The colon was otherwise normal.  There was no diverticulosis, inflammation, polyps or cancers unless previously stated. Retroflexed views revealed small internal hemorrhoids. The time to cecum=3 minutes 46 seconds.  Withdrawal time=11 minutes 03 seconds. The scope was withdrawn and the procedure completed.  COMPLICATIONS: There were no complications.  ENDOSCOPIC IMPRESSION: 1.   Mild diverticulosis was noted in the sigmoid colon 2.   Small internal hemorrhoids  RECOMMENDATIONS: 1.  High fiber diet with liberal fluid intake. 2.  Given your age, you will not need another colonoscopy for colon cancer screening or polyp surveillance.  These types of tests usually stop around the age 31.  eSigned:  Meryl Dare, MD, [facility] 05/08/2012 11:16 AM

## 2012-05-08 NOTE — Patient Instructions (Addendum)
Discharge instructions given with verbal understanding. Handouts on diverticulosis and hemorrhoids given. Resume previous medications. YOU HAD AN ENDOSCOPIC PROCEDURE TODAY AT THE [facility]: Refer to the procedure report that was given to you for any specific questions about what was found during the examination.  If the procedure report does not answer your questions, please call your gastroenterologist to clarify.  If you requested that your care partner not be given the details of your procedure findings, then the procedure report has been included in a sealed envelope for you to review at your convenience later.  YOU SHOULD EXPECT: Some feelings of bloating in the abdomen. Passage of more gas than usual.  Walking can help get rid of the air that was put into your GI tract during the procedure and reduce the bloating. If you had a lower endoscopy (such as a colonoscopy or flexible sigmoidoscopy) you may notice spotting of blood in your stool or on the toilet paper. If you underwent a bowel prep for your procedure, then you may not have a normal bowel movement for a few days.  DIET: Your first meal following the procedure should be a light meal and then it is ok to progress to your normal diet.  A half-sandwich or bowl of soup is an example of a good first meal.  Heavy or fried foods are harder to digest and may make you feel nauseous or bloated.  Likewise meals heavy in dairy and vegetables can cause extra gas to form and this can also increase the bloating.  Drink plenty of fluids but you should avoid alcoholic beverages for 24 hours.  ACTIVITY: Your care partner should take you home directly after the procedure.  You should plan to take it easy, moving slowly for the rest of the day.  You can resume normal activity the day after the procedure however you should NOT DRIVE or use heavy machinery for 24 hours (because of the sedation medicines used during the test).    SYMPTOMS TO REPORT  IMMEDIATELY: A gastroenterologist can be reached at any hour.  During normal business hours, 8:30 AM to 5:00 PM Monday through Friday, call [phone].  After hours and on weekends, please call the GI answering service at [phone] who will take a message and have the physician on call contact you.   Following lower endoscopy (colonoscopy or flexible sigmoidoscopy):  Excessive amounts of blood in the stool  Significant tenderness or worsening of abdominal pains  Swelling of the abdomen that is new, acute  Fever of 100F or higher  FOLLOW UP: If any biopsies were taken you will be contacted by phone or by letter within the next 1-3 weeks.  Call your gastroenterologist if you have not heard about the biopsies in 3 weeks.  Our staff will call the home number listed on your records the next business day following your procedure to check on you and address any questions or concerns that you may have at that time regarding the information given to you following your procedure. This is a courtesy call and so if there is no answer at the home number and we have not heard from you through the emergency physician on call, we will assume that you have returned to your regular daily activities without incident.  SIGNATURES/CONFIDENTIALITY: You and/or your care partner have signed paperwork which will be entered into your electronic medical record.  These signatures attest to the fact that that the information above on your After Visit   Summary has been reviewed and is understood.  Full responsibility of the confidentiality of this discharge information lies with you and/or your care-partner. 

## 2012-05-08 NOTE — Progress Notes (Signed)
Patient did not experience any of the following events: a burn prior to discharge; a fall within the facility; wrong site/side/patient/procedure/implant event; or a hospital transfer or hospital admission upon discharge from the facility. (G8907) Patient did not have preoperative order for IV antibiotic SSI prophylaxis. (G8918)  

## 2012-05-09 ENCOUNTER — Telehealth: Payer: Self-pay

## 2012-05-09 NOTE — Telephone Encounter (Signed)
  Follow up Call-  Call back number 05/08/2012  Post procedure Call Back phone  # [phone]  Permission to leave phone message Yes     Patient questions:  Do you have a fever, pain , or abdominal swelling? no Pain Score  0 *  Have you tolerated food without any problems? yes  Have you been able to return to your normal activities? yes  Do you have any questions about your discharge instructions: Diet   no Medications  no Follow up visit  no  Do you have questions or concerns about your Care? no  Actions: * If pain score is 4 or above: No action needed, pain <4.

## 2012-12-19 ENCOUNTER — Encounter (HOSPITAL_COMMUNITY): Payer: Self-pay | Admitting: Emergency Medicine

## 2012-12-19 ENCOUNTER — Emergency Department (HOSPITAL_COMMUNITY)
Admission: EM | Admit: 2012-12-19 | Discharge: 2012-12-19 | Disposition: A | Discharge status: Home / Self Care | Payer: Medicare Other | Attending: Emergency Medicine | Admitting: Emergency Medicine

## 2012-12-19 DIAGNOSIS — K219 Gastro-esophageal reflux disease without esophagitis: Secondary | ICD-10-CM | POA: Insufficient documentation

## 2012-12-19 DIAGNOSIS — Z7982 Long term (current) use of aspirin: Secondary | ICD-10-CM | POA: Insufficient documentation

## 2012-12-19 DIAGNOSIS — Z9889 Other specified postprocedural states: Secondary | ICD-10-CM | POA: Insufficient documentation

## 2012-12-19 DIAGNOSIS — E785 Hyperlipidemia, unspecified: Secondary | ICD-10-CM | POA: Insufficient documentation

## 2012-12-19 DIAGNOSIS — I10 Essential (primary) hypertension: Secondary | ICD-10-CM | POA: Insufficient documentation

## 2012-12-19 DIAGNOSIS — Z79899 Other long term (current) drug therapy: Secondary | ICD-10-CM | POA: Insufficient documentation

## 2012-12-19 DIAGNOSIS — Z794 Long term (current) use of insulin: Secondary | ICD-10-CM | POA: Insufficient documentation

## 2012-12-19 DIAGNOSIS — E119 Type 2 diabetes mellitus without complications: Secondary | ICD-10-CM | POA: Insufficient documentation

## 2012-12-19 LAB — POCT I-STAT, CHEM 8
Creatinine, Ser: 1.1 mg/dL (ref 0.50–1.35)
HCT: 41 % (ref 39.0–52.0)
Hemoglobin: 13.9 g/dL (ref 13.0–17.0)
Potassium: 3.9 mEq/L (ref 3.5–5.1)
Sodium: 135 mEq/L (ref 135–145)

## 2012-12-19 MED ORDER — LISINOPRIL 2.5 MG PO TABS
2.5000 mg | ORAL_TABLET | Freq: Once | ORAL | Status: DC
  Filled 2012-12-19: qty 1

## 2012-12-19 MED ORDER — LISINOPRIL 2.5 MG PO TABS: 5.0000 mg | ORAL_TABLET | Freq: Every day | ORAL | Status: DC

## 2012-12-19 NOTE — ED Provider Notes (Signed)
CSN: [id number]     Arrival date & time 12/19/12  2157 History   First MD Initiated Contact with Patient 12/19/12 2201     Chief Complaint  Patient presents with  . Hypertension    HPI Patient presented to the emergency room for elevated blood pressure. Patient has a history of hypertension. He was taking lisinopril 2.5 mg.  He had stopped taking his blood pressure medications in September because his blood pressure was running low at times. Recently it started increasing. He started taking his lisinopril again this weekend. This evening he noticed that his blood pressure was running higher. The patient became concerned when he took his blood pressure and it was up to 170/100. He denies any trouble with chest pain, headache, shortness of breath, numbness or weakness. He did notice some ringing in his ear his earlier.  Past Medical History  Diagnosis Date  . Palpitations     Negative holter in 2009 except for tachycardia with bicycling  . Diabetes mellitus, type 2   . Diverticulosis   . Hiatal hernia   . Hyperlipidemia   . GERD (gastroesophageal reflux disease)   . Normal cardiac stress test Jan 2009    Normal ETT  . Abnormal stress electrocardiogram test Jan 2013    Negative cardiac catheterization.    Past Surgical History  Procedure Laterality Date  . Cholecystectomy    . Inguinal hernia repair    . Hemorrhoid surgery    . Orif clavicular fracture      left  . Tonsillectomy    . Cardiac catheterization      In the 70's reportedly normal. Had cut down of the brachial artery.   . Cardiac catheterization  Feb 2013    Mild nonobstructive CAD. Normal LV function  . Colonoscopy     Family History  Problem Relation Age of Onset  . Prostate cancer Father   . Diabetes Father   . Diverticulosis Father   . Prostate cancer Paternal Grandfather   . Stroke Mother   . Diabetes Paternal Grandmother   . Colon cancer Neg Hx   . Hypertension Brother   . Diabetes Brother    History   Substance Use Topics  . Smoking status: Never Smoker   . Smokeless tobacco: Never Used  . Alcohol Use: No    Review of Systems  All other systems reviewed and are negative.    Allergies  Review of patient's allergies indicates no known allergies.  Home Medications   Current Outpatient Rx  Name  Route  Sig  Dispense  Refill  . aspirin 81 MG tablet   Oral   Take 81 mg by mouth daily.           [name] Kitchen co-enzyme Q-10 30 MG capsule   Oral   Take 30 mg by mouth daily.           . Cranberry 400 MG CAPS   Oral   Take 300 mg by mouth daily.         [name] Kitchen esomeprazole (NEXIUM) 40 MG capsule   Oral   Take 1 capsule (40 mg total) by mouth daily before breakfast.   90 capsule   1   . insulin glargine (LANTUS) 100 UNIT/ML injection   Subcutaneous   Inject 20 Units into the skin at bedtime.          [name] Kitchen JANUVIA 100 MG tablet               .  linagliptin (TRADJENTA) 5 MG TABS tablet   Oral   Take 5 mg by mouth daily.         [name] Kitchen lisinopril (ZESTRIL) 2.5 MG tablet   Oral   Take 2 tablets (5 mg total) by mouth daily.   90 tablet   3   . Magnesium 400 MG CAPS   Oral   Take by mouth daily.         . metFORMIN (GLUCOPHAGE-XR) 750 MG 24 hr tablet   Oral   Take 750 mg by mouth 2 (two) times daily.           . Multiple Vitamin (MULTIVITAMIN) tablet   Oral   Take 1 tablet by mouth daily.           [name] Kitchen OLIVE LEAF EXTRACT PO   Oral   Take by mouth. I tablespoon once daily          . pioglitazone (ACTOS) 45 MG tablet   Oral   Take 45 mg by mouth daily.           . pravastatin (PRAVACHOL) 80 MG tablet   Oral   Take 80 mg by mouth daily.           . Tamsulosin HCl (FLOMAX) 0.4 MG CAPS   Oral   Take 0.4 mg by mouth daily.           . vitamin B-12 (CYANOCOBALAMIN) 1000 MCG tablet   Oral   Take 1,000 mcg by mouth daily.         . vitamin E 1000 UNIT capsule   Oral   Take 1,200 Units by mouth daily.          BP 163/92  Pulse 110  Temp(Src)  98.1 F (36.7 C) (Oral)  Resp 18  SpO2 97% Physical Exam  Nursing note and vitals reviewed. Constitutional: He appears well-developed and well-nourished. No distress.  HENT:  Head: Normocephalic and atraumatic.  Right Ear: External ear normal.  Left Ear: External ear normal.  Eyes: Conjunctivae are normal. Right eye exhibits no discharge. Left eye exhibits no discharge. No scleral icterus.  Neck: Neck supple. No tracheal deviation present.  Cardiovascular: Normal rate, regular rhythm and intact distal pulses.   Pulmonary/Chest: Effort normal and breath sounds normal. No stridor. No respiratory distress. He has no wheezes. He has no rales.  Abdominal: Soft. Bowel sounds are normal. He exhibits no distension. There is no tenderness. There is no rebound and no guarding.  Musculoskeletal: He exhibits no edema and no tenderness.  Neurological: He is alert. He has normal strength. No sensory deficit. Cranial nerve deficit:  no gross defecits noted. He exhibits normal muscle tone. He displays no seizure activity. Coordination normal.  Skin: Skin is warm and dry. No rash noted.  Psychiatric: He has a normal mood and affect.    ED Course  Procedures (including critical care time) Labs Review Labs Reviewed  POCT I-STAT, CHEM 8 - Abnormal; Notable for the following:    Glucose, Bld 144 (*)    All other components within normal limits   Imaging Review No results found.  EKG Interpretation   None       MDM   1. Hypertension     Patient has asymptomatic hypertension. Repeat blood pressure was lower at 163/92. The patient take an extra 2.5 mg of lisinopril and start taking 5 mg daily. Recommend he followup with his primary Dr. to review his blood pressure and his blood pressure  medications    Celene Kras, MD 12/19/12 [phone]

## 2012-12-19 NOTE — ED Notes (Signed)
Pt states he noticed his blood pressure has been elevated for the past 2 days. He stopped taking his blood pressure medication (lisinopril) in early September when he noticed his blood pressure dropping too low (systolic below 100) and making him feel lethargic. He started taking his lisinopril again this past weekend when he noticed his blood pressure rising back up. He has started hearing ringing in his ears, starting tonight. He denies any other symptoms.

## 2012-12-20 ENCOUNTER — Telehealth: Payer: Self-pay | Admitting: *Deleted

## 2012-12-20 NOTE — Telephone Encounter (Signed)
Message copied by Baldwin Jamaica on Thu Dec 20, 2012 11:03 AM ------      Message from: Pecola Lawless      Created: Thu Dec 20, 2012  8:18 AM       Last seen 2007 ; if he wishes to remain on patient list ; he should schedule a Medicare Wellness exam to update chart ------

## 2012-12-20 NOTE — Telephone Encounter (Signed)
Spoke with pts wife and advised that pt needed to scheudle CPE in order to continue to remain a patient of Dr. Frederik Pear. Patient not home at this time but will call back to schedule this.   Patient needs Medicare Wellness Exam with [employer]

## 2012-12-21 ENCOUNTER — Encounter: Payer: Self-pay | Admitting: Cardiology

## 2012-12-24 ENCOUNTER — Encounter: Payer: Self-pay | Admitting: Cardiology

## 2012-12-25 ENCOUNTER — Encounter: Payer: Self-pay | Admitting: Nurse Practitioner

## 2012-12-25 ENCOUNTER — Ambulatory Visit (INDEPENDENT_AMBULATORY_CARE_PROVIDER_SITE_OTHER): Payer: Medicare Other | Admitting: Nurse Practitioner

## 2012-12-25 VITALS — BP 110/70 | HR 60 | Ht 71.0 in | Wt 179.1 lb

## 2012-12-25 DIAGNOSIS — I10 Essential (primary) hypertension: Secondary | ICD-10-CM

## 2012-12-25 MED ORDER — LISINOPRIL 2.5 MG PO TABS: 2.5000 mg | ORAL_TABLET | Freq: Two times a day (BID) | ORAL | Status: DC

## 2012-12-25 NOTE — Progress Notes (Signed)
Scott Herman Date of Birth: 02-24-1934 Medical Record #[id number]  History of Present Illness: Mr. Scott Herman is seen back today for a follow up visit. Seen for Dr. Antoine Poche. Has mild nonobstructive CAD following past false positive Myoview. Other issues include HTN, IDDM, and HTN.   Last seen a year and a half ago by myself - post cath visit - was doing well.  Was in the ER last week - for elevated blood pressure. He was taking lisinopril 2.5 mg. He had stopped taking his blood pressure medications in September because his blood pressure was running low at times. Recently it started increasing. He started taking his lisinopril again this past weekend. On the evening that he went to the ER he noticed that his blood pressure was running higher. The patient became concerned when he took his blood pressure and it was up to 170/100. He denies any trouble with chest pain, headache, shortness of breath, numbness or weakness. He did notice some ringing in his ear his earlier. Was given an extra 2.5 mg of lisinopril and instructed to increase his dose to 5 mg daily.   Comes in today. Here with his wife. Doing ok. No chest pain or shortness of breath. Has gotten pretty lax about his exercise. Had a spell last night where he got hot and could feel his heart "throbbing" thru his fingers - BP was 158/93 - he took an extra 5 mg of Lisinopril. Lipids and A1C checked by Dr. Leslie Dales.   Current Outpatient Prescriptions  Medication Sig Dispense Refill  . aspirin 81 MG tablet Take 81 mg by mouth daily.        . cholecalciferol (VITAMIN D) 400 UNITS TABS tablet Take 400 Units by mouth daily.      [name] Kitchen co-enzyme Q-10 30 MG capsule Take 30 mg by mouth daily.        . Cranberry 400 MG CAPS Take 300 mg by mouth daily.      [name] Kitchen esomeprazole (NEXIUM) 40 MG capsule Take 1 capsule (40 mg total) by mouth daily before breakfast.  90 capsule  1  . insulin glargine (LANTUS) 100 UNIT/ML injection Inject 20 Units into the skin  daily.       [name] Kitchen JANUVIA 100 MG tablet       . linagliptin (TRADJENTA) 5 MG TABS tablet Take 5 mg by mouth daily.      [name] Kitchen lisinopril (ZESTRIL) 2.5 MG tablet Take 1 tablet (2.5 mg total) by mouth 2 (two) times daily.  60 tablet  11  . Magnesium 400 MG CAPS Take by mouth daily.      . metFORMIN (GLUCOPHAGE-XR) 750 MG 24 hr tablet Take 750 mg by mouth 2 (two) times daily.        . Multiple Vitamin (MULTIVITAMIN) tablet Take 1 tablet by mouth daily.        [name] Kitchen OLIVE LEAF EXTRACT PO Take by mouth. I tablespoon once daily       . pioglitazone (ACTOS) 45 MG tablet Take 45 mg by mouth daily.        . pravastatin (PRAVACHOL) 80 MG tablet Take 80 mg by mouth daily.        . Tamsulosin HCl (FLOMAX) 0.4 MG CAPS Take 0.4 mg by mouth daily.        . vitamin B-12 (CYANOCOBALAMIN) 1000 MCG tablet Take 1,000 mcg by mouth daily.      . vitamin E 1000 UNIT capsule Take 1,200 Units by mouth daily.  No current facility-administered medications for this visit.    No Known Allergies  Past Medical History  Diagnosis Date  . Palpitations     Negative holter in 2009 except for tachycardia with bicycling  . Diabetes mellitus, type 2   . Diverticulosis   . Hiatal hernia   . Hyperlipidemia   . GERD (gastroesophageal reflux disease)   . Normal cardiac stress test Jan 2009    Normal ETT  . Abnormal stress electrocardiogram test Jan 2013    Negative cardiac catheterization.     Past Surgical History  Procedure Laterality Date  . Cholecystectomy    . Inguinal hernia repair    . Hemorrhoid surgery    . Orif clavicular fracture      left  . Tonsillectomy    . Cardiac catheterization      In the 70's reportedly normal. Had cut down of the brachial artery.   . Cardiac catheterization  Feb 2013    Mild nonobstructive CAD. Normal LV function  . Colonoscopy      History  Smoking status  . Never Smoker   Smokeless tobacco  . Never Used    History  Alcohol Use No    Family History  Problem  Relation Age of Onset  . Prostate cancer Father   . Diabetes Father   . Diverticulosis Father   . Prostate cancer Paternal Grandfather   . Stroke Mother   . Diabetes Paternal Grandmother   . Colon cancer Neg Hx   . Hypertension Brother   . Diabetes Brother     Review of Systems: The review of systems is per the HPI.  All other systems were reviewed and are negative.  Physical Exam: BP 110/70  Pulse 60  Ht 5\' 11"  (1.803 m)  Wt 179 lb 1.9 oz (81.248 kg)  BMI 24.99 kg/m2 BP is 120/80 my be.  Patient is alert and in no acute distress. Skin is warm and dry. Color is normal.  HEENT is unremarkable. Normocephalic/atraumatic. PERRL. Sclera are nonicteric. Neck is supple. No masses. No JVD. Lungs are clear. Cardiac exam shows a regular rate and rhythm. Abdomen is soft. Extremities are without edema. Gait and ROM are intact. No gross neurologic deficits noted.  LABORATORY DATA:  Lab Results  Component Value Date   WBC 7.9 03/30/2011   HGB 13.9 12/19/2012   HCT 41.0 12/19/2012   PLT 239.0 03/30/2011   GLUCOSE 144* 12/19/2012   NA 135 12/19/2012   K 3.9 12/19/2012   CL 99 12/19/2012   CREATININE 1.10 12/19/2012   BUN 15 12/19/2012   CO2 26 03/30/2011   TSH 1.50 02/09/2007   INR 1.0 03/30/2011   Coronary angiography:  Coronary dominance: Right  Left mainstem: Normal  Left anterior descending (LAD): Large wrapping the apex. Diffuse luminal irregularities. Mid diagonal large and normal.  Left circumflex (LCx): AV groove normal. Large ramus intermediate normal. Small obtuse marginals x2 normal. Posterior lateral moderate sized and normal. Second posterior lateral large and normal.  Right coronary artery (RCA): Large vessel with proximal diffuse luminal irregularities. Mid 25% stenosis. Distal 25% stenosis before the PDA. PDA was moderate-sized with ostial 25% stenosis. There are small posterior laterals.  Left ventriculography: Left ventricular systolic function is normal, LVEF is  estimated at 55-65%, there is no significant mitral regurgitation  Final Conclusions: Mild nonobstructive coronary disease. Normal left ventricular function.  Recommendations: No further cardiovascular testing. The patient had a false positive exercise treadmill test. Further evaluation can be per  his primary physician.  Rollene Rotunda  04/01/2011, 8:12 AM   Assessment / Plan: 1. HTN - I am split dosing his ACE - may see better control. Needs his cuff checked - will see back in 4 to 6 weeks.   2. Mild nonobstructive CAD per cath 04/2011 - no symptoms reported.   3. HLD - on statin  4. IDDM - sees Dr. Leslie Dales.  Patient is agreeable to this plan and will call if any problems develop in the interim.   Rosalio Macadamia, RN, ANP-C [facility] [address] [place], [place]  [address]

## 2012-12-25 NOTE — Patient Instructions (Addendum)
Stay on your current medicines but take the Lisinopril 2.5 mg two times a day  Minimize your salt intake  Monitor your blood pressure at home - goal is less than 120/80 since you have diabetes  I will see you in 4 to 6 weeks - bring your cuff with you so we can check  Call the [facility] office at [phone] if you have any questions, problems or concerns.

## 2012-12-29 LAB — HM DIABETES EYE EXAM

## 2012-12-30 ENCOUNTER — Encounter (HOSPITAL_COMMUNITY): Payer: Self-pay | Admitting: Emergency Medicine

## 2012-12-30 ENCOUNTER — Emergency Department (HOSPITAL_COMMUNITY)
Admission: EM | Admit: 2012-12-30 | Discharge: 2012-12-30 | Disposition: A | Discharge status: Home / Self Care | Payer: Medicare Other | Attending: Emergency Medicine | Admitting: Emergency Medicine

## 2012-12-30 DIAGNOSIS — E119 Type 2 diabetes mellitus without complications: Secondary | ICD-10-CM | POA: Insufficient documentation

## 2012-12-30 DIAGNOSIS — Z7982 Long term (current) use of aspirin: Secondary | ICD-10-CM | POA: Insufficient documentation

## 2012-12-30 DIAGNOSIS — E785 Hyperlipidemia, unspecified: Secondary | ICD-10-CM | POA: Insufficient documentation

## 2012-12-30 DIAGNOSIS — Z79899 Other long term (current) drug therapy: Secondary | ICD-10-CM | POA: Insufficient documentation

## 2012-12-30 DIAGNOSIS — K219 Gastro-esophageal reflux disease without esophagitis: Secondary | ICD-10-CM | POA: Insufficient documentation

## 2012-12-30 DIAGNOSIS — Z794 Long term (current) use of insulin: Secondary | ICD-10-CM | POA: Insufficient documentation

## 2012-12-30 DIAGNOSIS — Z9861 Coronary angioplasty status: Secondary | ICD-10-CM | POA: Insufficient documentation

## 2012-12-30 DIAGNOSIS — R000 Tachycardia, unspecified: Secondary | ICD-10-CM | POA: Insufficient documentation

## 2012-12-30 DIAGNOSIS — I10 Essential (primary) hypertension: Secondary | ICD-10-CM

## 2012-12-30 DIAGNOSIS — M6281 Muscle weakness (generalized): Secondary | ICD-10-CM | POA: Insufficient documentation

## 2012-12-30 LAB — POCT I-STAT, CHEM 8
Calcium, Ion: 1.25 mmol/L (ref 1.13–1.30)
Creatinine, Ser: 1.2 mg/dL (ref 0.50–1.35)
Glucose, Bld: 135 mg/dL — ABNORMAL HIGH (ref 70–99)
HCT: 45 % (ref 39.0–52.0)
Hemoglobin: 15.3 g/dL (ref 13.0–17.0)
Potassium: 4.2 mEq/L (ref 3.5–5.1)
Sodium: 137 mEq/L (ref 135–145)
TCO2: 26 mmol/L (ref 0–100)

## 2012-12-30 LAB — GLUCOSE, CAPILLARY: Glucose-Capillary: 175 mg/dL — ABNORMAL HIGH (ref 70–99)

## 2012-12-30 NOTE — ED Provider Notes (Signed)
Medical screening examination/treatment/procedure(s) were conducted as a shared visit with non-physician practitioner(s) and myself.  I personally evaluated the patient during the encounter.  EKG Interpretation     Ventricular Rate:  101 PR Interval:  182 QRS Duration: 73 QT Interval:  341 QTC Calculation: 442 R Axis:   -53 Text Interpretation:  Sinus tachycardia Ventricular premature complex Probable left atrial enlargement Left anterior fascicular block Minimal ST depression, inferior leads Baseline wander in lead(s) V2 SINCE LAST TRACING HEART RATE HAS INCREASED             Doug Sou, MD 12/30/12 1600

## 2012-12-30 NOTE — ED Notes (Signed)
Pt was in church, felt weak and ask a paramedic at church to take BP and it was 180/130. Upon arrival here, pt BP, 123 76. Pt adds that he weakness is normal for him. Pt CBG 175. Pt states that he feels that he doesn't need to be here now and wants to leave. Pt reports that he had blood work and EKG 2 weeks prior and everything was normal. Pt is A&O and in NAD

## 2012-12-30 NOTE — ED Provider Notes (Signed)
CSN: [id number]     Arrival date & time 12/30/12  2952 History   First MD Initiated Contact with Patient 12/30/12 1015     Chief Complaint  Patient presents with  . Hypertension  . Weakness   (Consider location/radiation/quality/duration/timing/severity/associated sxs/prior Treatment) HPI Comments: Pt states that he had some weakness in his legs this morning which means usually for him that he has a bp or blood sugar problem:pt states that someone at church checked his bp and it was 180/130:pt states that he was seen here on 10/22 and then as his cardiologist office last week:pt states that they changed his lisinopril dose:denies cp or sob  The history is provided by the patient. No language interpreter was used.    Past Medical History  Diagnosis Date  . Palpitations     Negative holter in 2009 except for tachycardia with bicycling  . Diabetes mellitus, type 2   . Diverticulosis   . Hiatal hernia   . Hyperlipidemia   . GERD (gastroesophageal reflux disease)   . Normal cardiac stress test Jan 2009    Normal ETT  . Abnormal stress electrocardiogram test Jan 2013    Negative cardiac catheterization.    Past Surgical History  Procedure Laterality Date  . Cholecystectomy    . Inguinal hernia repair    . Hemorrhoid surgery    . Orif clavicular fracture      left  . Tonsillectomy    . Cardiac catheterization      In the 70's reportedly normal. Had cut down of the brachial artery.   . Cardiac catheterization  Feb 2013    Mild nonobstructive CAD. Normal LV function  . Colonoscopy     Family History  Problem Relation Age of Onset  . Prostate cancer Father   . Diabetes Father   . Diverticulosis Father   . Prostate cancer Paternal Grandfather   . Stroke Mother   . Diabetes Paternal Grandmother   . Colon cancer Neg Hx   . Hypertension Brother   . Diabetes Brother    History  Substance Use Topics  . Smoking status: Never Smoker   . Smokeless tobacco: Never Used  . Alcohol  Use: No    Review of Systems  Constitutional: Negative.   Respiratory: Negative.   Cardiovascular: Negative.     Allergies  Review of patient's allergies indicates no known allergies.  Home Medications   Current Outpatient Rx  Name  Route  Sig  Dispense  Refill  . aspirin 81 MG tablet   Oral   Take 81 mg by mouth at bedtime.          [name] Kitchen JANUVIA 100 MG tablet               . lisinopril (ZESTRIL) 2.5 MG tablet   Oral   Take 1 tablet (2.5 mg total) by mouth 2 (two) times daily.   60 tablet   11   . metFORMIN (GLUCOPHAGE-XR) 750 MG 24 hr tablet   Oral   Take 750 mg by mouth 2 (two) times daily.           . Tamsulosin HCl (FLOMAX) 0.4 MG CAPS   Oral   Take 0.4 mg by mouth daily.           . cholecalciferol (VITAMIN D) 400 UNITS TABS tablet   Oral   Take 400 Units by mouth daily.         [name] Kitchen co-enzyme Q-10 30 MG capsule  Oral   Take 30 mg by mouth daily.           . Cranberry 400 MG CAPS   Oral   Take 300 mg by mouth daily.         [name] Kitchen esomeprazole (NEXIUM) 40 MG capsule   Oral   Take 1 capsule (40 mg total) by mouth daily before breakfast.   90 capsule   1   . insulin glargine (LANTUS) 100 UNIT/ML injection   Subcutaneous   Inject 20 Units into the skin daily.          [name] Kitchen linagliptin (TRADJENTA) 5 MG TABS tablet   Oral   Take 5 mg by mouth daily.         . Magnesium 400 MG CAPS   Oral   Take by mouth daily.         . Multiple Vitamin (MULTIVITAMIN) tablet   Oral   Take 1 tablet by mouth daily.           [name] Kitchen OLIVE LEAF EXTRACT PO   Oral   Take by mouth. I tablespoon once daily          . pioglitazone (ACTOS) 45 MG tablet   Oral   Take 45 mg by mouth daily.           . pravastatin (PRAVACHOL) 80 MG tablet   Oral   Take 80 mg by mouth daily.           . vitamin B-12 (CYANOCOBALAMIN) 1000 MCG tablet   Oral   Take 1,000 mcg by mouth daily.         . vitamin E 1000 UNIT capsule   Oral   Take 1,200 Units by mouth  daily.          BP 123/76  Pulse 105  Temp(Src) 97.6 F (36.4 C) (Oral)  Resp 16  SpO2 97% Physical Exam  Nursing note and vitals reviewed. Constitutional: He appears well-developed and well-nourished.  Cardiovascular: Regular rhythm.  Tachycardia present.   Pulmonary/Chest: Effort normal and breath sounds normal.  Musculoskeletal: Normal range of motion.    ED Course  Procedures (including critical care time) Labs Review Labs Reviewed  GLUCOSE, CAPILLARY - Abnormal; Notable for the following:    Glucose-Capillary 175 (*)    All other components within normal limits  POCT I-STAT, CHEM 8 - Abnormal; Notable for the following:    Glucose, Bld 135 (*)    All other components within normal limits   Imaging Review No results found.  EKG Interpretation     Ventricular Rate:  101 PR Interval:  182 QRS Duration: 73 QT Interval:  341 QTC Calculation: 442 R Axis:   -53 Text Interpretation:  Sinus tachycardia Ventricular premature complex Probable left atrial enlargement Left anterior fascicular block Minimal ST depression, inferior leads Baseline wander in lead(s) V2 SINCE LAST TRACING HEART RATE HAS INCREASED            MDM   1. HTN (hypertension)    Pt to follow up with Dr. Hochrein:pt kidney function is normal:ekg with tachycardia consistent with previous visits    Teressa Lower, NP 12/30/12 1110

## 2012-12-30 NOTE — ED Provider Notes (Signed)
Patient felt generally weak at church this morning symptoms lasted approximately 30 minutes, resolved spontaneously. Denies chest pain denies abdominal pain denies headache he is presently asymptomatic without treatment. He asked the paramedics to take his blood pressure at church it was 180/130. Exam patient is in no distress alert Glasgow Coma Score 15 lungs clear auscultation heart regular rate and rhythm abdomen nondistended nontender  Doug Sou, MD 12/30/12 1029

## 2012-12-31 ENCOUNTER — Telehealth: Payer: Self-pay

## 2012-12-31 NOTE — Telephone Encounter (Signed)
Per pt request to be seen by MD for HTN.  appt given for 11/6 at 2 pm.  Today his BP has been as high as 142/87 at 7 am and 119/76 this afternoon.  He will continue to keep a BP diary between now and the time of the appointment.   He will call back if further questions, concerns or needs.

## 2012-12-31 NOTE — Telephone Encounter (Signed)
New problem    Offer an appt with Tereso Newcomer today @ 3:40 pm pt refused stating he does not want to see PA only Dr. Antoine Poche    C/O blood pressure 180/130 yesterday at church  - went to er at [facility] long  126/70 .

## 2013-01-03 ENCOUNTER — Encounter: Payer: Self-pay | Admitting: Cardiology

## 2013-01-03 ENCOUNTER — Encounter: Payer: Self-pay | Admitting: *Deleted

## 2013-01-03 ENCOUNTER — Ambulatory Visit (INDEPENDENT_AMBULATORY_CARE_PROVIDER_SITE_OTHER): Payer: Medicare Other | Admitting: Cardiology

## 2013-01-03 ENCOUNTER — Encounter (INDEPENDENT_AMBULATORY_CARE_PROVIDER_SITE_OTHER): Payer: Medicare Other

## 2013-01-03 VITALS — BP 134/78 | HR 93 | Ht 71.0 in | Wt 177.0 lb

## 2013-01-03 DIAGNOSIS — I10 Essential (primary) hypertension: Secondary | ICD-10-CM

## 2013-01-03 DIAGNOSIS — R002 Palpitations: Secondary | ICD-10-CM

## 2013-01-03 NOTE — Patient Instructions (Signed)
Your physician recommends that you schedule a follow-up appointment in:  1 month.   Dr.Hochrein would like you to wear a 24 hour blood pressure monitor

## 2013-01-03 NOTE — Progress Notes (Signed)
HPI The patient presents for evaluation of weakness. He had an episode recently where his blood pressure was somewhat elevated and he came to the emergency room. A couple of days ago he had another emergency room visit. This was after having some weakness at church. Somebody there took his blood pressure and diastolic was said to be 130. However, I reviewed the ER records and by the time he was there his blood pressure was 90/67. There was no documentation of a hypertensive urgency. Blood work was unremarkable. EKG was unremarkable. He was sent home from the emergency room. He's had no recurrent severe episodes of this. He denies any chest pressure, neck or arm discomfort. He's had no new palpitations, presyncope or syncope. He's had no PND or orthopnea.  No Known Allergies  Current Outpatient Prescriptions  Medication Sig Dispense Refill  . aspirin 81 MG tablet Take 81 mg by mouth at bedtime.       [name] Kitchen esomeprazole (NEXIUM) 40 MG capsule Take 1 capsule (40 mg total) by mouth daily before breakfast.  90 capsule  1  . insulin glargine (LANTUS) 100 UNIT/ML injection Inject 20 Units into the skin daily.       [name] Kitchen JANUVIA 100 MG tablet       . lisinopril (ZESTRIL) 2.5 MG tablet Take 1 tablet (2.5 mg total) by mouth 2 (two) times daily.  60 tablet  11  . metFORMIN (GLUCOPHAGE-XR) 750 MG 24 hr tablet Take 750 mg by mouth 2 (two) times daily.        . Multiple Vitamin (MULTIVITAMIN) tablet Take 1 tablet by mouth daily.        [name] Kitchen OLIVE LEAF EXTRACT PO Take by mouth. I tablespoon once daily       . pravastatin (PRAVACHOL) 80 MG tablet Take 80 mg by mouth at bedtime.       . Tamsulosin HCl (FLOMAX) 0.4 MG CAPS Take 0.4 mg by mouth daily.        . vitamin B-12 (CYANOCOBALAMIN) 1000 MCG tablet Take 1,000 mcg by mouth daily.      . cholecalciferol (VITAMIN D) 400 UNITS TABS tablet Take 400 Units by mouth daily.      [name] Kitchen co-enzyme Q-10 30 MG capsule Take 30 mg by mouth daily.        . Cranberry 400 MG CAPS Take  300 mg by mouth daily.      . Magnesium 400 MG CAPS Take by mouth daily.      . vitamin E 1000 UNIT capsule Take 1,200 Units by mouth daily.       No current facility-administered medications for this visit.    Past Medical History  Diagnosis Date  . Palpitations     Negative holter in 2009 except for tachycardia with bicycling  . Diabetes mellitus, type 2   . Diverticulosis   . Hiatal hernia   . Hyperlipidemia   . GERD (gastroesophageal reflux disease)   . Normal cardiac stress test Jan 2009    Normal ETT  . Abnormal stress electrocardiogram test Jan 2013    Negative cardiac catheterization.     Past Surgical History  Procedure Laterality Date  . Cholecystectomy    . Inguinal hernia repair    . Hemorrhoid surgery    . Orif clavicular fracture      left  . Tonsillectomy    . Cardiac catheterization      In the 70's reportedly normal. Had cut down of the brachial artery.   [name] Kitchen  Cardiac catheterization  Feb 2013    Mild nonobstructive CAD. Normal LV function  . Colonoscopy      ROS:  As stated in the HPI and negative for all other systems.  PHYSICAL EXAM BP 134/78  Pulse 93  Ht 5\' 11"  (1.803 m)  Wt 177 lb (80.287 kg)  BMI 24.70 kg/m2  SpO2 96% GENERAL:  Well appearing HEENT:  Pupils equal round and reactive, fundi not visualized, oral mucosa unremarkable NECK:  No jugular venous distention, waveform within normal limits, carotid upstroke brisk and symmetric, no bruits, no thyromegaly LYMPHATICS:  No cervical, inguinal adenopathy LUNGS:  Clear to auscultation bilaterally BACK:  No CVA tenderness CHEST:  Unremarkable HEART:  PMI not displaced or sustained,S1 and S2 within normal limits, no S3, no S4, no clicks, no rubs, no murmurs ABD:  Flat, positive bowel sounds normal in frequency in pitch, no bruits, no rebound, no guarding, no midline pulsatile mass, no hepatomegaly, no splenomegaly EXT:  2 plus pulses throughout, no edema, no cyanosis no clubbing SKIN:  No  rashes no nodules   EKG:  Sinus rhythm, rate 93, axis within normal limits, intervals within normal limits, no acute ST-T wave changes.  01/03/2013    ASSESSMENT AND PLAN  HTN:  Given the fluctuating blood pressures and recent concern I will have him use a 24-hour blood pressure monitor. Further changes in therapy will be based on this.  PALPITATIONS:  Is not complaining this. At this point no change in therapy is indicated. I am not planning further evaluation specifically for this.

## 2013-01-03 NOTE — Progress Notes (Signed)
Patient ID: Scott Herman, male   DOB: 1933-08-19, 79 y.o.   MRN: [id number] AMB 24 hour B/P monitor applied to patient.

## 2013-01-11 ENCOUNTER — Telehealth: Payer: Self-pay | Admitting: Cardiology

## 2013-01-11 NOTE — Telephone Encounter (Signed)
New problem:  Pt states he wants to find out his recent test results. 24 hr monitor... Please advise

## 2013-01-11 NOTE — Telephone Encounter (Signed)
Pt aware that Dr Antoine Poche has the results with him and I will call back next week once I have them.

## 2013-01-29 ENCOUNTER — Ambulatory Visit: Payer: Medicare Other | Admitting: Nurse Practitioner

## 2013-02-14 ENCOUNTER — Encounter: Payer: Self-pay | Admitting: Cardiology

## 2013-02-14 ENCOUNTER — Ambulatory Visit (INDEPENDENT_AMBULATORY_CARE_PROVIDER_SITE_OTHER): Payer: Medicare Other | Admitting: Cardiology

## 2013-02-14 VITALS — BP 122/62 | HR 85 | Ht 71.0 in | Wt 183.0 lb

## 2013-02-14 DIAGNOSIS — R9439 Abnormal result of other cardiovascular function study: Secondary | ICD-10-CM

## 2013-02-14 NOTE — Progress Notes (Signed)
HPI The patient presents for followup of hypertension.at the last visit he was having episodic weakness and some low blood pressures but it was quite labile. It was difficult for me to understand. Therefore, I placed a 24-hour blood pressure monitor. This interestingly did show blood pressures all within normal limits during the day and in fact some very low blood pressures. He was only hypertensive at night. He probably didn't sleep well that night per his report. He still reports a dizzy episode with some blood pressures that the into the 90s systolic. He's not had any frank syncope. He's not had any chest pressure, neck or arm discomfort. He's had no new shortness of breath, PND or orthopnea. He does have some lower extremity swelling.  No Known Allergies  Current Outpatient Prescriptions  Medication Sig Dispense Refill  . aspirin 81 MG tablet Take 81 mg by mouth at bedtime.       . cholecalciferol (VITAMIN D) 400 UNITS TABS tablet Take 400 Units by mouth daily.      [name] Kitchen co-enzyme Q-10 30 MG capsule Take 30 mg by mouth daily.        . Cranberry 400 MG CAPS Take 300 mg by mouth daily.      [name] Kitchen esomeprazole (NEXIUM) 40 MG capsule Take 1 capsule (40 mg total) by mouth daily before breakfast.  90 capsule  1  . insulin glargine (LANTUS) 100 UNIT/ML injection Inject 20 Units into the skin daily.       [name] Kitchen JANUVIA 100 MG tablet       . lisinopril (ZESTRIL) 2.5 MG tablet Take 1 tablet (2.5 mg total) by mouth 2 (two) times daily.  60 tablet  11  . Magnesium 400 MG CAPS Take by mouth daily.      . metFORMIN (GLUCOPHAGE-XR) 750 MG 24 hr tablet Take 750 mg by mouth 2 (two) times daily.        . Multiple Vitamin (MULTIVITAMIN) tablet Take 1 tablet by mouth daily.        [name] Kitchen OLIVE LEAF EXTRACT PO Take by mouth. I tablespoon once daily       . pravastatin (PRAVACHOL) 80 MG tablet Take 80 mg by mouth at bedtime.       . Tamsulosin HCl (FLOMAX) 0.4 MG CAPS Take 0.4 mg by mouth daily.        . vitamin B-12  (CYANOCOBALAMIN) 1000 MCG tablet Take 1,000 mcg by mouth daily.      . vitamin E 1000 UNIT capsule Take 1,200 Units by mouth daily.       No current facility-administered medications for this visit.    Past Medical History  Diagnosis Date  . Palpitations     Negative holter in 2009 except for tachycardia with bicycling  . Diabetes mellitus, type 2   . Diverticulosis   . Hiatal hernia   . Hyperlipidemia   . GERD (gastroesophageal reflux disease)   . Normal cardiac stress test Jan 2009    Normal ETT  . Abnormal stress electrocardiogram test Jan 2013    Negative cardiac catheterization.     Past Surgical History  Procedure Laterality Date  . Cholecystectomy    . Inguinal hernia repair    . Hemorrhoid surgery    . Orif clavicular fracture      left  . Tonsillectomy    . Cardiac catheterization      In the 70's reportedly normal. Had cut down of the brachial artery.   . Cardiac catheterization  Feb 2013    Mild nonobstructive CAD. Normal LV function  . Colonoscopy      ROS:  As stated in the HPI and negative for all other systems.  PHYSICAL EXAM BP 122/62  Pulse 85  Ht 5\' 11"  (1.803 m)  Wt 183 lb (83.008 kg)  BMI 25.53 kg/m2  SpO2 96% GENERAL:  Well appearing HEENT:  Pupils equal round and reactive, fundi not visualized, oral mucosa unremarkable NECK:  No jugular venous distention, waveform within normal limits, carotid upstroke brisk and symmetric, no bruits, no thyromegaly LYMPHATICS:  No cervical, inguinal adenopathy LUNGS:  Clear to auscultation bilaterally BACK:  No CVA tenderness CHEST:  Unremarkable HEART:  PMI not displaced or sustained,S1 and S2 within normal limits, no S3, no S4, no clicks, no rubs, no murmurs ABD:  Flat, positive bowel sounds normal in frequency in pitch, no bruits, no rebound, no guarding, no midline pulsatile mass, no hepatomegaly, no splenomegaly EXT:  2 plus pulses throughout, no edema, no cyanosis no clubbing SKIN:  No rashes no  nodules  ASSESSMENT AND PLAN  HTN:  I reviewed his 24-hour blood pressure monitor result. Given this and the fact he runs low during the day I think he can stop his a.m. Lisinopril and only take it at night.  PALPITATIONS:  Is not complaining this. At this point no change in therapy is indicated. I am not planning further evaluation specifically for this.    EDEMA:  I don't think this is related to heart failure. This is probably some mild venous changes. We discussed conservative therapies but no other evaluation is suggested at this point.

## 2013-02-14 NOTE — Patient Instructions (Signed)
The current medical regimen is effective;  continue present plan and medications.  Follow up in 6 months with Dr Hochrein.  You will receive a letter in the mail 2 months before you are due.  Please call us when you receive this letter to schedule your follow up appointment.  

## 2013-02-22 ENCOUNTER — Telehealth: Payer: Self-pay

## 2013-02-22 NOTE — Telephone Encounter (Signed)
Medication List and allergies:  Reviewed and updated  90 day supply/mail order:  na Local prescriptions: [employer]  Immunizations due: shingles, PNA, Tdap  A/P:   No changes to FH or PSH or personal hx Flu vaccine--11/2012 [facility]--04/2012--Dr Stark--nml PSA--08/2009--0.94  To Discuss with Provider: Not at this time

## 2013-02-26 ENCOUNTER — Encounter: Payer: Self-pay | Admitting: Internal Medicine

## 2013-02-26 ENCOUNTER — Ambulatory Visit (INDEPENDENT_AMBULATORY_CARE_PROVIDER_SITE_OTHER): Payer: Medicare Other | Admitting: Internal Medicine

## 2013-02-26 VITALS — BP 107/66 | HR 89 | Temp 98.0°F | Ht 71.0 in | Wt 178.6 lb

## 2013-02-26 DIAGNOSIS — Z0000 Encounter for general adult medical examination without abnormal findings: Secondary | ICD-10-CM

## 2013-02-26 DIAGNOSIS — E119 Type 2 diabetes mellitus without complications: Secondary | ICD-10-CM

## 2013-02-26 DIAGNOSIS — R030 Elevated blood-pressure reading, without diagnosis of hypertension: Secondary | ICD-10-CM

## 2013-02-26 NOTE — Progress Notes (Signed)
Pre visit review using our clinic review tool, if applicable. No additional management support is needed unless otherwise documented below in the visit note. 

## 2013-02-26 NOTE — Progress Notes (Signed)
   Subjective:    Patient ID: Scott Herman, male    DOB: [birth year], 79 y.o.   MRN: [id number]  HPI    Review of Systems     Objective:   Physical Exam  Notation was made of teeth in good repair. He does have an upper plate lower partial.        Assessment & Plan:

## 2013-02-26 NOTE — Patient Instructions (Signed)
Minimal Blood Pressure Goal= AVERAGE < 140/90;  Ideal is an AVERAGE < 135/85. This AVERAGE should be calculated from @ least 5-7 BP readings taken @ different times of day on different days of week. You should not respond to isolated BP readings , but rather the AVERAGE for that week .Please bring your  blood pressure cuff to office visits to verify that it is reliable.It  can also be checked against the blood pressure device at the pharmacy. Finger or wrist cuffs are not dependable; an arm cuff is. Cardiovascular exercise, this can be as simple a program as walking, is recommended 30-45 minutes 3-4 times per week. If you're not exercising you should take 6-8 weeks to build up to this level. Verify TSH (thyroid ) has been monitored by Dr Altheimer.
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
## 2013-03-04 ENCOUNTER — Ambulatory Visit: Payer: Medicare Other | Admitting: *Deleted

## 2013-03-04 DIAGNOSIS — Z23 Encounter for immunization: Secondary | ICD-10-CM

## 2013-03-04 MED ORDER — PNEUMOCOCCAL VAC POLYVALENT 25 MCG/0.5ML IJ INJ: 0.5000 mL | INJECTION | INTRAMUSCULAR | Status: DC

## 2014-05-05 ENCOUNTER — Other Ambulatory Visit: Payer: Self-pay | Admitting: Interventional Cardiology

## 2014-05-12 ENCOUNTER — Encounter: Payer: Self-pay | Admitting: Internal Medicine

## 2014-05-12 ENCOUNTER — Ambulatory Visit (INDEPENDENT_AMBULATORY_CARE_PROVIDER_SITE_OTHER): Payer: Medicare Other | Admitting: Internal Medicine

## 2014-05-12 ENCOUNTER — Ambulatory Visit (INDEPENDENT_AMBULATORY_CARE_PROVIDER_SITE_OTHER)
Admission: RE | Admit: 2014-05-12 | Discharge: 2014-05-12 | Disposition: A | Discharge status: Home / Self Care | Payer: Medicare Other | Source: Ambulatory Visit | Attending: Internal Medicine | Admitting: Internal Medicine

## 2014-05-12 VITALS — BP 100/70 | HR 93 | Temp 97.7°F | Resp 15 | Ht 71.5 in | Wt 182.0 lb

## 2014-05-12 DIAGNOSIS — M533 Sacrococcygeal disorders, not elsewhere classified: Secondary | ICD-10-CM

## 2014-05-12 NOTE — Progress Notes (Signed)
Pre visit review using our clinic review tool, if applicable. No additional management support is needed unless otherwise documented below in the visit note. 
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
## 2014-05-12 NOTE — Patient Instructions (Signed)
  Your next office appointment will be determined based upon review of your pending  x-rays. Those instructions will be transmitted to you by mail. Critical values will be called. Followup as needed for any active or acute issue. Please report any significant change in your symptoms. Use a rubber doughnut when sitting for prolonged period time to prevent discomfort in the coccyx. Soaking  in hot tub will also provide relief. Use an anti-inflammatory cream such as Aspercreme or Zostrix cream twice a day to the affected area as needed. In lieu of this warm moist compresses or  hot water bottle can be used. Do not apply ice .

## 2014-05-14 ENCOUNTER — Ambulatory Visit (INDEPENDENT_AMBULATORY_CARE_PROVIDER_SITE_OTHER): Payer: Medicare Other | Admitting: Podiatry

## 2014-05-14 ENCOUNTER — Encounter: Payer: Self-pay | Admitting: Podiatry

## 2014-05-14 VITALS — BP 135/80 | HR 100 | Resp 12

## 2014-05-14 DIAGNOSIS — L84 Corns and callosities: Secondary | ICD-10-CM | POA: Diagnosis not present

## 2014-05-14 DIAGNOSIS — L02611 Cutaneous abscess of right foot: Secondary | ICD-10-CM

## 2014-05-14 DIAGNOSIS — L03031 Cellulitis of right toe: Secondary | ICD-10-CM | POA: Diagnosis not present

## 2014-05-14 MED ORDER — CEPHALEXIN 500 MG PO CAPS: 500.0000 mg | ORAL_CAPSULE | Freq: Two times a day (BID) | ORAL | Status: DC

## 2014-05-14 NOTE — Progress Notes (Signed)
   Subjective:    Patient ID: Scott Herman, male    DOB: 05-May-1933, 81 y.o.   MRN: [id number]  HPI  N-SORE L-RT FOOT 4TH TOE D-3 WEEKS O-SUDDENLY C-WORSE A-SHOES T-SCRAPE WITH THE BOARD  Patient denies history of foot ulceration or claudication  Review of Systems  All other systems reviewed and are negative.      Objective:   Physical Exam  Orientated 3  Vascular: DP pulses 0/4 bilaterally PT pulses 2/4 bilaterally Capillary reflex immediate bilaterally Mild pitting edema ankles bilaterally  Neurological: Sensation to 10 g monofilament wire intact 4/5 right and 5/5 left Ankle reflex equal and reactive bilaterally Vibratory sensation intact bilaterally  Dermatological: The hallux toenails hypertrophic, discolored and brittle The lateral aspect of the fourth right toe has a small keratoses overlying a 5 mm erythematous base, without any edema, warmth or drainage.  Musculoskeletal: Hammertoe deformities 2-5 bilaterally No restriction ankle, subtalar, midtarsal joints bilaterally      Assessment & Plan:   Assessment: Decreased DP pulses suggestive of peripheral arterial disease Protective sensation intact bilaterally Keratoses fourth right toe with low-grade cellulitis  Plan: Debrided keratoses and apply protective dressing with antibiotic ointment. Rx cephalexin 500 mg by mouth twice a day 7 days Dispensed silicone gel cap to cover the fourth right toe after patient removes protective bandaging 1-3 days  Return if redness increases or does not respond to treatment

## 2014-05-14 NOTE — Patient Instructions (Signed)
Take antibiotic capsule one twice a day by mouth 7 days Removed bandage on fourth right toe in the next 1-3 days (or after getting wet) Apply silicone gel To the fourth right toe after removal bandage during the day and remove at night Observe inflamed area fourth right toe and return if the inflammation, redness, increases over time  Diabetes and Foot Care Diabetes may cause you to have problems because of poor blood supply (circulation) to your feet and legs. This may cause the skin on your feet to become thinner, break easier, and heal more slowly. Your skin may become dry, and the skin may peel and crack. You may also have nerve damage in your legs and feet causing decreased feeling in them. You may not notice minor injuries to your feet that could lead to infections or more serious problems. Taking care of your feet is one of the most important things you can do for yourself.  HOME CARE INSTRUCTIONS  Wear shoes at all times, even in the house. Do not go barefoot. Bare feet are easily injured.  Check your feet daily for blisters, cuts, and redness. If you cannot see the bottom of your feet, use a mirror or ask someone for help.  Wash your feet with warm water (do not use hot water) and mild soap. Then pat your feet and the areas between your toes until they are completely dry. Do not soak your feet as this can dry your skin.  Apply a moisturizing lotion or petroleum jelly (that does not contain alcohol and is unscented) to the skin on your feet and to dry, brittle toenails. Do not apply lotion between your toes.  Trim your toenails straight across. Do not dig under them or around the cuticle. File the edges of your nails with an emery board or nail file.  Do not cut corns or calluses or try to remove them with medicine.  Wear clean socks or stockings every day. Make sure they are not too tight. Do not wear knee-high stockings since they may decrease blood flow to your legs.  Wear shoes  that fit properly and have enough cushioning. To break in new shoes, wear them for just a few hours a day. This prevents you from injuring your feet. Always look in your shoes before you put them on to be sure there are no objects inside.  Do not cross your legs. This may decrease the blood flow to your feet.  If you find a minor scrape, cut, or break in the skin on your feet, keep it and the skin around it clean and dry. These areas may be cleansed with mild soap and water. Do not cleanse the area with peroxide, alcohol, or iodine.  When you remove an adhesive bandage, be sure not to damage the skin around it.  If you have a wound, look at it several times a day to make sure it is healing.  Do not use heating pads or hot water bottles. They may burn your skin. If you have lost feeling in your feet or legs, you may not know it is happening until it is too late.  Make sure your health care provider performs a complete foot exam at least annually or more often if you have foot problems. Report any cuts, sores, or bruises to your health care provider immediately. SEEK MEDICAL CARE IF:   You have an injury that is not healing.  You have cuts or breaks in the skin.  You have an ingrown nail.  You notice redness on your legs or feet.  You feel burning or tingling in your legs or feet.  You have pain or cramps in your legs and feet.  Your legs or feet are numb.  Your feet always feel cold. SEEK IMMEDIATE MEDICAL CARE IF:   There is increasing redness, swelling, or pain in or around a wound.  There is a red line that goes up your leg.  Pus is coming from a wound.  You develop a fever or as directed by your health care provider.  You notice a bad smell coming from an ulcer or wound. Document Released: 02/12/2000 Document Revised: 10/17/2012 Document Reviewed: 07/24/2012 [facility] Patient Information 2015 [place], [place]. This information is not intended to replace advice given to you  by your health care provider. Make sure you discuss any questions you have with your health care provider.

## 2014-06-26 ENCOUNTER — Encounter: Payer: Self-pay | Admitting: Cardiology

## 2014-06-26 ENCOUNTER — Ambulatory Visit (INDEPENDENT_AMBULATORY_CARE_PROVIDER_SITE_OTHER): Payer: Medicare Other | Admitting: Cardiology

## 2014-06-26 VITALS — BP 116/62 | HR 101 | Ht 70.0 in | Wt 178.5 lb

## 2014-06-26 DIAGNOSIS — I152 Hypertension secondary to endocrine disorders: Secondary | ICD-10-CM | POA: Insufficient documentation

## 2014-06-26 DIAGNOSIS — R9439 Abnormal result of other cardiovascular function study: Secondary | ICD-10-CM | POA: Diagnosis not present

## 2014-06-26 DIAGNOSIS — I10 Essential (primary) hypertension: Secondary | ICD-10-CM | POA: Diagnosis not present

## 2014-06-26 DIAGNOSIS — E1159 Type 2 diabetes mellitus with other circulatory complications: Secondary | ICD-10-CM | POA: Insufficient documentation

## 2014-06-26 MED ORDER — LISINOPRIL 2.5 MG PO TABS: 2.5000 mg | ORAL_TABLET | Freq: Every day | ORAL | Status: DC | PRN

## 2014-06-26 NOTE — Patient Instructions (Addendum)
Dr.Hochrein wants you to follow-up in: 18 months. You will receive a reminder letter in the mail two months in advance. If you don't receive a letter, please call our office to schedule the follow-up appointment.

## 2014-06-26 NOTE — Progress Notes (Signed)
HPI The patient presents for followup of hypertension.  Since I last saw her she has done well.  The patient denies any new symptoms such as chest discomfort, neck or arm discomfort. There has been no new shortness of breath, PND or orthopnea. There have been no reported palpitations, presyncope or syncope.  He keeps a detailed BP diary.  Blood pressures have been excellent. Over the last couple days they've been a little elevated. Otherwise he's not complaining of any presyncope or syncope. He has no palpitations. His heart rate is high today but typically doesn't. He takes his lisinopril only as needed. He remains active riding a bicycle.   No Known Allergies  Current Outpatient Prescriptions  Medication Sig Dispense Refill  . aspirin 81 MG tablet Take 81 mg by mouth at bedtime.     . cholecalciferol (VITAMIN D) 400 UNITS TABS tablet Take 400 Units by mouth daily.    [name] Kitchen co-enzyme Q-10 30 MG capsule Take 30 mg by mouth daily.      [name] Kitchen esomeprazole (NEXIUM) 40 MG capsule Take 1 capsule (40 mg total) by mouth daily before breakfast. 90 capsule 1  . insulin glargine (LANTUS) 100 UNIT/ML injection Inject 20 Units into the skin daily.     [name] Kitchen lisinopril (PRINIVIL,ZESTRIL) 2.5 MG tablet TAKE 1 BY MOUTH TWICE DAILY --LORI C. GERHARDT 30 tablet 0  . Magnesium 400 MG CAPS Take by mouth daily.    . metFORMIN (GLUCOPHAGE-XR) 750 MG 24 hr tablet Take 750 mg by mouth 2 (two) times daily.      . Multiple Vitamin (MULTIVITAMIN) tablet Take 1 tablet by mouth daily.      . pioglitazone (ACTOS) 45 MG tablet Take 45 mg by mouth daily.    . pravastatin (PRAVACHOL) 80 MG tablet Take 80 mg by mouth at bedtime.     . saxagliptin HCl (ONGLYZA) 5 MG TABS tablet Take 5 mg by mouth daily.    . Tamsulosin HCl (FLOMAX) 0.4 MG CAPS Take 0.4 mg by mouth daily.      . vitamin B-12 (CYANOCOBALAMIN) 1000 MCG tablet Take 1,000 mcg by mouth daily.    . vitamin E 1000 UNIT capsule Take 1,200 Units by mouth daily.     No current  facility-administered medications for this visit.    Past Medical History  Diagnosis Date  . Palpitations     Negative holter in 2009 except for tachycardia with bicycling  . Diabetes mellitus, type 2     Dr Altheimer  . Diverticulosis   . Hiatal hernia   . Hyperlipidemia   . GERD (gastroesophageal reflux disease)   . Normal cardiac stress test Jan 2009    Normal ETT  . Abnormal stress electrocardiogram test Jan 2013    Negative cardiac catheterization.     Past Surgical History  Procedure Laterality Date  . Cholecystectomy    . Inguinal hernia repair    . Hemorrhoid surgery    . Orif clavicular fracture      left  . Tonsillectomy    . Cardiac catheterization      In the 70's reportedly normal. Had cut down of the brachial artery.   . Cardiac catheterization  Feb 2013    Mild nonobstructive CAD. Normal LV function  . Colonoscopy      Dr Fuller Plan    ROS:  As stated in the HPI and negative for all other systems.  PHYSICAL EXAM BP 116/62 mmHg  Pulse 101  Ht 5\' 10"  (1.778 m)  Wt 178 lb 8 oz (80.967 kg)  BMI 25.61 kg/m2 GENERAL:  Well appearing NECK:  No jugular venous distention, waveform within normal limits, carotid upstroke brisk and symmetric, no bruits, no thyromegaly LUNGS:  Clear to auscultation bilaterally CHEST:  Unremarkable HEART:  PMI not displaced or sustained,S1 and S2 within normal limits, no S3, no S4, no clicks, no rubs, no murmurs ABD:  Flat, positive bowel sounds normal in frequency in pitch, no bruits, no rebound, no guarding, no midline pulsatile mass, no hepatomegaly, no splenomegaly EXT:  2 plus pulses throughout, no edema, no cyanosis no clubbing SKIN:  No rashes no nodules  EKG:  Sinus rhythm, rate 101, leftward axis, no acute ST-T wave changes leftward axis. 06/26/2014   ASSESSMENT AND PLAN  HTN:  I reviewed his extensive blood pressure diary his pressures are well controlled. On the rare occasion that his blood pressure goes up I agree that  he can take his lisinopril as needed.  PALPITATIONS:  Is not complaining this. At this point no change in therapy is indicated. I am not planning further evaluation specifically for this.    EDEMA:  I don't think this is related to heart failure and is very mild. No change in therapy is indicated.

## 2014-08-25 ENCOUNTER — Other Ambulatory Visit: Payer: Self-pay

## 2015-01-12 ENCOUNTER — Ambulatory Visit (INDEPENDENT_AMBULATORY_CARE_PROVIDER_SITE_OTHER): Payer: Medicare Other | Admitting: Internal Medicine

## 2015-01-12 ENCOUNTER — Encounter: Payer: Self-pay | Admitting: Internal Medicine

## 2015-01-12 VITALS — BP 122/72 | HR 82 | Temp 98.0°F | Resp 16 | Ht 71.0 in | Wt 176.0 lb

## 2015-01-12 DIAGNOSIS — E119 Type 2 diabetes mellitus without complications: Secondary | ICD-10-CM | POA: Diagnosis not present

## 2015-01-12 DIAGNOSIS — K219 Gastro-esophageal reflux disease without esophagitis: Secondary | ICD-10-CM

## 2015-01-12 DIAGNOSIS — N400 Enlarged prostate without lower urinary tract symptoms: Secondary | ICD-10-CM

## 2015-01-12 DIAGNOSIS — I10 Essential (primary) hypertension: Secondary | ICD-10-CM

## 2015-01-12 NOTE — Assessment & Plan Note (Signed)
Following with endocrine Diabetes controlled, last A1c 6.3% Diabetic diet Exercising No change in medications

## 2015-01-12 NOTE — Patient Instructions (Signed)
It was nice to meet you.   We have reviewed your prior records including labs and tests today.  All other Health Maintenance issues reviewed.   No immunizations administered today.   Medications reviewed and updated.  No changes recommended at this time.

## 2015-01-12 NOTE — Assessment & Plan Note (Signed)
Controlled Following with gastroenterology Continue daily Nexium

## 2015-01-12 NOTE — Assessment & Plan Note (Signed)
Blood pressure will controlled Continue low-dose lisinopril

## 2015-01-12 NOTE — Progress Notes (Signed)
Pre visit review using our clinic review tool, if applicable. No additional management support is needed unless otherwise documented below in the visit note. 

## 2015-01-12 NOTE — Progress Notes (Signed)
Subjective:    Patient ID: Scott Herman, male    DOB: 05/06/1933, 81 y.o.   MRN: [id number]  HPI He is here to establish with a new pcp.  He has no concerns.   Diabetes: He is taking his medication daily as prescribed. He follows with endocrine.  He is compliant with a diabetic diet. He is exercising regularly. He checks her feet daily and denies foot lesions. He is up-to-date with an ophthalmology examination. His last a1c was 6.3%.  GERD:  He is taking his medication daily as prescribed.  He denies any GERD symptoms and feels his GERD is well controlled.  He follows with gastroenterology.  Hypertension: He is taking his medication daily. He is compliant with a low sodium diet.  He denies chest pain, palpitations, edema, shortness of breath and regular headaches. He is exercising regularly.    Benign prostatic hypertrophy: He follows with urology. He is taking Flomax daily.    Follows with endo, cardio, urology, gastro and orthopedics.    Medications and allergies reviewed with patient and updated if appropriate.  Patient Active Problem List   Diagnosis Date Noted  . HTN (hypertension) 06/26/2014  . Elevated blood pressure reading without diagnosis of hypertension 02/26/2013  . Abnormal stress test 04/19/2011  . Edema 03/25/2011  . GERD 08/27/2009  . DIVERTICULOSIS-COLON 08/27/2009  . DIABETES MELLITUS, TYPE II 05/28/2007  . MICROSCOPIC HEMATURIA 05/28/2007  . NOCTURIA 05/28/2007  . PALPITATIONS 02/09/2007    Current Outpatient Prescriptions on File Prior to Visit  Medication Sig Dispense Refill  . aspirin 81 MG tablet Take 81 mg by mouth at bedtime.     [name] Kitchen co-enzyme Q-10 30 MG capsule Take 30 mg by mouth daily.      [name] Kitchen esomeprazole (NEXIUM) 40 MG capsule Take 1 capsule (40 mg total) by mouth daily before breakfast. 90 capsule 1  . lisinopril (PRINIVIL,ZESTRIL) 2.5 MG tablet Take 1 tablet (2.5 mg total) by mouth daily as needed. 90 tablet 3  . Magnesium 400 MG CAPS Take  by mouth daily.    . metFORMIN (GLUCOPHAGE-XR) 750 MG 24 hr tablet Take 750 mg by mouth 2 (two) times daily.      . Multiple Vitamin (MULTIVITAMIN) tablet Take 1 tablet by mouth daily.      . pioglitazone (ACTOS) 45 MG tablet Take 45 mg by mouth daily.    . pravastatin (PRAVACHOL) 80 MG tablet Take 80 mg by mouth at bedtime.     . saxagliptin HCl (ONGLYZA) 5 MG TABS tablet Take 5 mg by mouth daily.    . Tamsulosin HCl (FLOMAX) 0.4 MG CAPS Take 0.4 mg by mouth daily.      . vitamin B-12 (CYANOCOBALAMIN) 1000 MCG tablet Take 1,000 mcg by mouth daily.    . vitamin E 1000 UNIT capsule Take 1,200 Units by mouth daily.     No current facility-administered medications on file prior to visit.    Past Medical History  Diagnosis Date  . Palpitations     Negative holter in 2009 except for tachycardia with bicycling  . Diabetes mellitus, type 2     Dr Altheimer  . Diverticulosis   . Hiatal hernia   . Hyperlipidemia   . GERD (gastroesophageal reflux disease)   . Normal cardiac stress test Jan 2009    Normal ETT  . Abnormal stress electrocardiogram test Jan 2013    Negative cardiac catheterization.     Past Surgical History  Procedure Laterality Date  .  Cholecystectomy    . Inguinal hernia repair    . Hemorrhoid surgery    . Orif clavicular fracture      left  . Tonsillectomy    . Cardiac catheterization  Feb 2013    Mild nonobstructive CAD. Normal LV function  . Colonoscopy      Dr Fuller Plan    Social History   Social History  . Marital Status: Married    Spouse Name: N/A  . Number of Children: N/A  . Years of Education: N/A   Occupational History  . Retired    Social History Main Topics  . Smoking status: Never Smoker   . Smokeless tobacco: Never Used  . Alcohol Use: No  . Drug Use: No  . Sexual Activity: Yes   Other Topics Concern  . Not on file   Social History Narrative    Review of Systems  Constitutional: Negative for fever and chills.  HENT: Negative for  congestion and sore throat.   Respiratory: Negative for cough, shortness of breath and wheezing.   Cardiovascular: Negative for chest pain, palpitations and leg swelling.  Gastrointestinal: Negative for nausea, abdominal pain, diarrhea, constipation and blood in stool.  Neurological: Negative for dizziness, weakness, light-headedness and numbness.  Psychiatric/Behavioral: Negative for dysphoric mood. The patient is not nervous/anxious.        Objective:   Filed Vitals:   01/12/15 1323  BP: 122/72  Pulse: 82  Temp: 98 F (36.7 C)  Resp: 16   Filed Weights   01/12/15 1323  Weight: 176 lb (79.833 kg)   Body mass index is 24.56 kg/(m^2).   Physical Exam  Constitutional: He appears well-developed and well-nourished. No distress.  HENT:  Head: Normocephalic and atraumatic.  Neck: Neck supple. No JVD present. No tracheal deviation present. No thyromegaly present.  No carotid bruit  Cardiovascular: Normal rate and regular rhythm.   Murmur heard. Pulmonary/Chest: Effort normal and breath sounds normal. No respiratory distress. He has no wheezes. He has no rales.  Musculoskeletal: He exhibits no edema.  Lymphadenopathy:    He has no cervical adenopathy.  Psychiatric: He has a normal mood and affect. His behavior is normal.          Assessment & Plan:   See Problem List.  Follow up as needed or for a PE

## 2015-03-30 ENCOUNTER — Encounter: Payer: Self-pay | Admitting: Gastroenterology

## 2015-04-16 DIAGNOSIS — M4806 Spinal stenosis, lumbar region: Secondary | ICD-10-CM | POA: Diagnosis not present

## 2015-05-12 ENCOUNTER — Other Ambulatory Visit: Payer: Self-pay | Admitting: Orthopedic Surgery

## 2015-05-12 DIAGNOSIS — M5416 Radiculopathy, lumbar region: Principal | ICD-10-CM

## 2015-05-12 DIAGNOSIS — M48061 Spinal stenosis, lumbar region without neurogenic claudication: Secondary | ICD-10-CM

## 2015-05-18 ENCOUNTER — Ambulatory Visit
Admission: RE | Admit: 2015-05-18 | Discharge: 2015-05-18 | Disposition: A | Discharge status: Home / Self Care | Payer: Medicare Other | Source: Ambulatory Visit | Attending: Orthopedic Surgery | Admitting: Orthopedic Surgery

## 2015-05-18 DIAGNOSIS — M5416 Radiculopathy, lumbar region: Principal | ICD-10-CM

## 2015-05-18 DIAGNOSIS — M5126 Other intervertebral disc displacement, lumbar region: Secondary | ICD-10-CM | POA: Diagnosis not present

## 2015-05-18 DIAGNOSIS — M48061 Spinal stenosis, lumbar region without neurogenic claudication: Secondary | ICD-10-CM

## 2015-05-18 MED ORDER — IOPAMIDOL (ISOVUE-M 200) INJECTION 41%
15.0000 mL | Freq: Once | INTRAMUSCULAR | Status: AC
  Administered 2015-05-18: 15 mL via INTRATHECAL

## 2015-05-18 MED ORDER — DIAZEPAM 5 MG PO TABS
5.0000 mg | ORAL_TABLET | Freq: Once | ORAL | Status: AC
  Administered 2015-05-18: 5 mg via ORAL

## 2015-05-18 NOTE — Discharge Instructions (Signed)

## 2015-05-19 ENCOUNTER — Other Ambulatory Visit: Payer: Medicare Other

## 2015-05-21 DIAGNOSIS — M4806 Spinal stenosis, lumbar region: Secondary | ICD-10-CM | POA: Diagnosis not present

## 2015-05-22 DIAGNOSIS — E119 Type 2 diabetes mellitus without complications: Secondary | ICD-10-CM | POA: Diagnosis not present

## 2015-06-22 DIAGNOSIS — E782 Mixed hyperlipidemia: Secondary | ICD-10-CM | POA: Diagnosis not present

## 2015-06-22 DIAGNOSIS — E119 Type 2 diabetes mellitus without complications: Secondary | ICD-10-CM | POA: Diagnosis not present

## 2015-06-26 DIAGNOSIS — E119 Type 2 diabetes mellitus without complications: Secondary | ICD-10-CM | POA: Diagnosis not present

## 2015-06-26 DIAGNOSIS — Z125 Encounter for screening for malignant neoplasm of prostate: Secondary | ICD-10-CM | POA: Diagnosis not present

## 2015-06-26 DIAGNOSIS — Z794 Long term (current) use of insulin: Secondary | ICD-10-CM | POA: Diagnosis not present

## 2015-06-26 DIAGNOSIS — E782 Mixed hyperlipidemia: Secondary | ICD-10-CM | POA: Diagnosis not present

## 2015-06-30 DIAGNOSIS — Z0000 Encounter for general adult medical examination without abnormal findings: Secondary | ICD-10-CM | POA: Diagnosis not present

## 2015-06-30 DIAGNOSIS — N138 Other obstructive and reflux uropathy: Secondary | ICD-10-CM | POA: Diagnosis not present

## 2015-06-30 DIAGNOSIS — N401 Enlarged prostate with lower urinary tract symptoms: Secondary | ICD-10-CM | POA: Diagnosis not present

## 2015-06-30 DIAGNOSIS — R351 Nocturia: Secondary | ICD-10-CM | POA: Diagnosis not present

## 2015-07-01 LAB — PSA: PSA: 1.22

## 2015-07-07 DIAGNOSIS — E119 Type 2 diabetes mellitus without complications: Secondary | ICD-10-CM | POA: Diagnosis not present

## 2015-07-30 ENCOUNTER — Encounter (INDEPENDENT_AMBULATORY_CARE_PROVIDER_SITE_OTHER): Payer: Medicare Other | Admitting: Ophthalmology

## 2015-07-30 DIAGNOSIS — H353131 Nonexudative age-related macular degeneration, bilateral, early dry stage: Secondary | ICD-10-CM

## 2015-07-30 DIAGNOSIS — H2513 Age-related nuclear cataract, bilateral: Secondary | ICD-10-CM

## 2015-07-30 DIAGNOSIS — H43813 Vitreous degeneration, bilateral: Secondary | ICD-10-CM | POA: Diagnosis not present

## 2015-07-30 LAB — HM DIABETES EYE EXAM

## 2015-07-31 ENCOUNTER — Encounter: Payer: Self-pay | Admitting: Internal Medicine

## 2015-08-10 DIAGNOSIS — S79911A Unspecified injury of right hip, initial encounter: Secondary | ICD-10-CM | POA: Diagnosis not present

## 2015-08-10 DIAGNOSIS — S4991XA Unspecified injury of right shoulder and upper arm, initial encounter: Secondary | ICD-10-CM | POA: Diagnosis not present

## 2015-08-10 DIAGNOSIS — S20211A Contusion of right front wall of thorax, initial encounter: Secondary | ICD-10-CM | POA: Diagnosis not present

## 2015-08-29 DIAGNOSIS — E119 Type 2 diabetes mellitus without complications: Secondary | ICD-10-CM | POA: Diagnosis not present

## 2015-09-29 DIAGNOSIS — E782 Mixed hyperlipidemia: Secondary | ICD-10-CM | POA: Diagnosis not present

## 2015-09-29 DIAGNOSIS — E119 Type 2 diabetes mellitus without complications: Secondary | ICD-10-CM | POA: Diagnosis not present

## 2015-10-05 DIAGNOSIS — E1165 Type 2 diabetes mellitus with hyperglycemia: Secondary | ICD-10-CM | POA: Diagnosis not present

## 2015-10-05 DIAGNOSIS — E782 Mixed hyperlipidemia: Secondary | ICD-10-CM | POA: Diagnosis not present

## 2015-10-05 DIAGNOSIS — Z794 Long term (current) use of insulin: Secondary | ICD-10-CM | POA: Diagnosis not present

## 2015-10-05 DIAGNOSIS — Z125 Encounter for screening for malignant neoplasm of prostate: Secondary | ICD-10-CM | POA: Diagnosis not present

## 2015-10-20 DIAGNOSIS — E119 Type 2 diabetes mellitus without complications: Secondary | ICD-10-CM | POA: Diagnosis not present

## 2015-10-26 DIAGNOSIS — M533 Sacrococcygeal disorders, not elsewhere classified: Secondary | ICD-10-CM | POA: Diagnosis not present

## 2015-11-04 DIAGNOSIS — S79811D Other specified injuries of right hip, subsequent encounter: Secondary | ICD-10-CM | POA: Diagnosis not present

## 2015-11-11 DIAGNOSIS — M533 Sacrococcygeal disorders, not elsewhere classified: Secondary | ICD-10-CM | POA: Diagnosis not present

## 2015-11-29 LAB — HEMOGLOBIN A1C: Hemoglobin A1C: 6.9

## 2015-12-01 DIAGNOSIS — M533 Sacrococcygeal disorders, not elsewhere classified: Secondary | ICD-10-CM | POA: Diagnosis not present

## 2015-12-01 DIAGNOSIS — E119 Type 2 diabetes mellitus without complications: Secondary | ICD-10-CM | POA: Diagnosis not present

## 2015-12-16 DIAGNOSIS — M533 Sacrococcygeal disorders, not elsewhere classified: Secondary | ICD-10-CM | POA: Diagnosis not present

## 2015-12-18 DIAGNOSIS — M533 Sacrococcygeal disorders, not elsewhere classified: Secondary | ICD-10-CM | POA: Diagnosis not present

## 2015-12-23 DIAGNOSIS — M533 Sacrococcygeal disorders, not elsewhere classified: Secondary | ICD-10-CM | POA: Diagnosis not present

## 2015-12-25 DIAGNOSIS — M533 Sacrococcygeal disorders, not elsewhere classified: Secondary | ICD-10-CM | POA: Diagnosis not present

## 2015-12-28 DIAGNOSIS — M533 Sacrococcygeal disorders, not elsewhere classified: Secondary | ICD-10-CM | POA: Diagnosis not present

## 2015-12-31 DIAGNOSIS — M533 Sacrococcygeal disorders, not elsewhere classified: Secondary | ICD-10-CM | POA: Diagnosis not present

## 2016-01-01 DIAGNOSIS — E119 Type 2 diabetes mellitus without complications: Secondary | ICD-10-CM | POA: Diagnosis not present

## 2016-01-01 DIAGNOSIS — E782 Mixed hyperlipidemia: Secondary | ICD-10-CM | POA: Diagnosis not present

## 2016-01-04 DIAGNOSIS — E782 Mixed hyperlipidemia: Secondary | ICD-10-CM | POA: Diagnosis not present

## 2016-01-04 DIAGNOSIS — E119 Type 2 diabetes mellitus without complications: Secondary | ICD-10-CM | POA: Diagnosis not present

## 2016-01-04 DIAGNOSIS — Z125 Encounter for screening for malignant neoplasm of prostate: Secondary | ICD-10-CM | POA: Diagnosis not present

## 2016-01-04 DIAGNOSIS — Z794 Long term (current) use of insulin: Secondary | ICD-10-CM | POA: Diagnosis not present

## 2016-01-04 DIAGNOSIS — Z23 Encounter for immunization: Secondary | ICD-10-CM | POA: Diagnosis not present

## 2016-01-06 DIAGNOSIS — N401 Enlarged prostate with lower urinary tract symptoms: Secondary | ICD-10-CM | POA: Diagnosis not present

## 2016-01-06 DIAGNOSIS — R350 Frequency of micturition: Secondary | ICD-10-CM | POA: Diagnosis not present

## 2016-02-03 NOTE — Progress Notes (Deleted)
Subjective:   Scott Herman is a 82 y.o. male who presents for Medicare Annual/Subsequent preventive examination.  The Patient was informed that the wellness visit is to identify future health risk and educate and initiate measures that can reduce risk for increased disease through the lifespan.   Describes health as fair, good or great?   Review of Systems:  No ROS.  Medicare Wellness Visit.    Sleep patterns:    Home Safety/Smoke Alarms:   Living environment; residence and Firearm Safety:  Seat Belt Safety/Bike Helmet:    Counseling:   Eye Exam-  Dental-  Male:   CCS-Colonoscopy 05/08/2012, diverticulosis. No recall.  PSA-0.910, 10/25/2010      Objective:    Vitals: There were no vitals taken for this visit.  There is no height or weight on file to calculate BMI.  Tobacco History  Smoking Status  . Never Smoker  Smokeless Tobacco  . Never Used     Counseling given: Not Answered   Past Medical History:  Diagnosis Date  . Abnormal stress electrocardiogram test Jan 2013   Negative cardiac catheterization.   . Diabetes mellitus, type 2 (HCC)    Dr Altheimer  . Diverticulosis   . GERD (gastroesophageal reflux disease)   . Hiatal hernia   . Hyperlipidemia   . Normal cardiac stress test Jan 2009   Normal ETT  . Palpitations    Negative holter in 2009 except for tachycardia with bicycling   Past Surgical History:  Procedure Laterality Date  . CARDIAC CATHETERIZATION  Feb 2013   Mild nonobstructive CAD. Normal LV function  . CHOLECYSTECTOMY    . COLONOSCOPY     Dr Fuller Plan  . HEMORRHOID SURGERY    . INGUINAL HERNIA REPAIR    . ORIF CLAVICULAR FRACTURE     left  . TONSILLECTOMY     Family History  Problem Relation Age of Onset  . Prostate cancer Father   . Diabetes Father   . Diverticulosis Father   . Prostate cancer Paternal Grandfather   . Stroke Mother     > 21  . Diabetes Paternal Grandmother   . Colon cancer Neg Hx   . Hypertension  Brother   . Diabetes Brother    History  Sexual Activity  . Sexual activity: Yes    Outpatient Encounter Prescriptions as of 02/04/2016  Medication Sig  . Ascorbic Acid (VITAMIN C) 1000 MG tablet Take 1,000 mg by mouth daily.  [name] Kitchen aspirin 81 MG tablet Take 81 mg by mouth at bedtime.   [name] Kitchen co-enzyme Q-10 30 MG capsule Take 30 mg by mouth daily.    [name] Kitchen esomeprazole (NEXIUM) 40 MG capsule Take 1 capsule (40 mg total) by mouth daily before breakfast.  . Insulin Glargine (TOUJEO SOLOSTAR [place]) Inject 24 Units into the skin daily.  [name] Kitchen lisinopril (PRINIVIL,ZESTRIL) 2.5 MG tablet Take 1 tablet (2.5 mg total) by mouth daily as needed.  . Magnesium 400 MG CAPS Take by mouth daily.  . metFORMIN (GLUCOPHAGE-XR) 750 MG 24 hr tablet Take 750 mg by mouth 2 (two) times daily.    . Multiple Vitamin (MULTIVITAMIN) tablet Take 1 tablet by mouth daily.    . pioglitazone (ACTOS) 45 MG tablet Take 45 mg by mouth daily.  . pravastatin (PRAVACHOL) 80 MG tablet Take 80 mg by mouth at bedtime.   . saxagliptin HCl (ONGLYZA) 5 MG TABS tablet Take 5 mg by mouth daily.  . Tamsulosin HCl (FLOMAX) 0.4 MG CAPS Take 0.4 mg  by mouth daily.    . vitamin B-12 (CYANOCOBALAMIN) 1000 MCG tablet Take 1,000 mcg by mouth daily.  . vitamin E 1000 UNIT capsule Take 1,200 Units by mouth daily.   No facility-administered encounter medications on file as of 02/04/2016.     Activities of Daily Living No flowsheet data found.  Patient Care Team: Binnie Rail, MD as PCP - General (Internal Medicine)   Assessment:    Physical assessment deferred to PCP.  Exercise Activities and Dietary recommendations   Diet (meal preparation, eat out, water intake, caffeinated beverages, dairy products, fruits and vegetables):   Breakfast: Lunch:  Dinner:      Goals    None     Fall Risk Fall Risk  05/12/2014 02/26/2013  Falls in the past year? No No   Depression Screen PHQ 2/9 Scores 05/12/2014 02/26/2013  PHQ - 2 Score 0 0     Cognitive Function        Immunization History  Administered Date(s) Administered  . Influenza-Unspecified 12/29/2012, 12/08/2014, 01/04/2016  . Zoster 02/26/2010   Screening Tests Health Maintenance  Topic Date Due  . HEMOGLOBIN A1C  05[birth year]21-[birth year]  . TETANUS/TDAP  05/03/1954  . PNA vac Low Risk Adult (1 of 2 - PCV13) 05/02/2000  . OPHTHALMOLOGY EXAM  07/29/2016  . FOOT EXAM  01/03/2017  . INFLUENZA VACCINE  Completed  . ZOSTAVAX  Completed      Plan:     Continue to eat heart healthy diet (full of fruits, vegetables, whole grains, lean protein, water--limit salt, fat, and sugar intake) and increase physical activity as tolerated.  Continue doing brain stimulating activities (puzzles, reading, adult coloring books, staying active) to keep memory sharp.   During the course of the visit the patient was educated and counseled about the following appropriate screening and preventive services:   Vaccines to include Pneumoccal, Influenza, Hepatitis B, Td, Zostavax, HCV  Cardiovascular Disease  Colorectal cancer screening  Diabetes screening  Prostate Cancer Screening  Glaucoma screening  Nutrition counseling   Smoking cessation counseling  Patient Instructions (the written plan) was given to the patient.    Gerilyn Nestle, RN  02/03/2016

## 2016-02-03 NOTE — Progress Notes (Deleted)
Pre visit review using our clinic review tool, if applicable. No additional management support is needed unless otherwise documented below in the visit note. 

## 2016-02-04 ENCOUNTER — Ambulatory Visit: Payer: Medicare Other

## 2016-02-15 DIAGNOSIS — E119 Type 2 diabetes mellitus without complications: Secondary | ICD-10-CM | POA: Diagnosis not present

## 2016-02-17 NOTE — Progress Notes (Signed)
Pre visit review using our clinic review tool, if applicable. No additional management support is needed unless otherwise documented below in the visit note. 
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
## 2016-02-18 ENCOUNTER — Ambulatory Visit (INDEPENDENT_AMBULATORY_CARE_PROVIDER_SITE_OTHER): Payer: Medicare Other

## 2016-02-18 DIAGNOSIS — Z0000 Encounter for general adult medical examination without abnormal findings: Secondary | ICD-10-CM

## 2016-02-18 DIAGNOSIS — Z23 Encounter for immunization: Secondary | ICD-10-CM

## 2016-02-18 NOTE — Patient Instructions (Addendum)
Continue to eat heart healthy diet (full of fruits, vegetables, whole grains, lean protein, water--limit salt, fat, and sugar intake) and increase physical activity as tolerated.  Continue doing brain stimulating activities (puzzles, reading, adult coloring books, staying active) to keep memory sharp.   Bring a copy of your advance directives to your next office visit.   Fall Prevention in the Home Introduction Falls can cause injuries. They can happen to people of all ages. There are many things you can do to make your home safe and to help prevent falls. What can I do on the outside of my home?  Regularly fix the edges of walkways and driveways and fix any cracks.  Remove anything that might make you trip as you walk through a door, such as a raised step or threshold.  Trim any bushes or trees on the path to your home.  Use bright outdoor lighting.  Clear any walking paths of anything that might make someone trip, such as rocks or tools.  Regularly check to see if handrails are loose or broken. Make sure that both sides of any steps have handrails.  Any raised decks and porches should have guardrails on the edges.  Have any leaves, snow, or ice cleared regularly.  Use sand or salt on walking paths during winter.  Clean up any spills in your garage right away. This includes oil or grease spills. What can I do in the bathroom?  Use night lights.  Install grab bars by the toilet and in the tub and shower. Do not use towel bars as grab bars.  Use non-skid mats or decals in the tub or shower.  If you need to sit down in the shower, use a plastic, non-slip stool.  Keep the floor dry. Clean up any water that spills on the floor as soon as it happens.  Remove soap buildup in the tub or shower regularly.  Attach bath mats securely with double-sided non-slip rug tape.  Do not have throw rugs and other things on the floor that can make you trip. What can I do in the  bedroom?  Use night lights.  Make sure that you have a light by your bed that is easy to reach.  Do not use any sheets or blankets that are too big for your bed. They should not hang down onto the floor.  Have a firm chair that has side arms. You can use this for support while you get dressed.  Do not have throw rugs and other things on the floor that can make you trip. What can I do in the kitchen?  Clean up any spills right away.  Avoid walking on wet floors.  Keep items that you use a lot in easy-to-reach places.  If you need to reach something above you, use a strong step stool that has a grab bar.  Keep electrical cords out of the way.  Do not use floor polish or wax that makes floors slippery. If you must use wax, use non-skid floor wax.  Do not have throw rugs and other things on the floor that can make you trip. What can I do with my stairs?  Do not leave any items on the stairs.  Make sure that there are handrails on both sides of the stairs and use them. Fix handrails that are broken or loose. Make sure that handrails are as long as the stairways.  Check any carpeting to make sure that it is firmly attached to the   stairs. Fix any carpet that is loose or worn.  Avoid having throw rugs at the top or bottom of the stairs. If you do have throw rugs, attach them to the floor with carpet tape.  Make sure that you have a light switch at the top of the stairs and the bottom of the stairs. If you do not have them, ask someone to add them for you. What else can I do to help prevent falls?  Wear shoes that:  Do not have high heels.  Have rubber bottoms.  Are comfortable and fit you well.  Are closed at the toe. Do not wear sandals.  If you use a stepladder:  Make sure that it is fully opened. Do not climb a closed stepladder.  Make sure that both sides of the stepladder are locked into place.  Ask someone to hold it for you, if possible.  Clearly mark and make  sure that you can see:  Any grab bars or handrails.  First and last steps.  Where the edge of each step is.  Use tools that help you move around (mobility aids) if they are needed. These include:  Canes.  Walkers.  Scooters.  Crutches.  Turn on the lights when you go into a dark area. Replace any light bulbs as soon as they burn out.  Set up your furniture so you have a clear path. Avoid moving your furniture around.  If any of your floors are uneven, fix them.  If there are any pets around you, be aware of where they are.  Review your medicines with your doctor. Some medicines can make you feel dizzy. This can increase your chance of falling. Ask your doctor what other things that you can do to help prevent falls. This information is not intended to replace advice given to you by your health care provider. Make sure you discuss any questions you have with your health care provider. Document Released: 12/11/2008 Document Revised: 07/23/2015 Document Reviewed: 03/21/2014  2017 Elsevier  Health Maintenance, Male A healthy lifestyle and preventative care can promote health and wellness.  Maintain regular health, dental, and eye exams.  Eat a healthy diet. Foods like vegetables, fruits, whole grains, low-fat dairy products, and lean protein foods contain the nutrients you need and are low in calories. Decrease your intake of foods high in solid fats, added sugars, and salt. Get information about a proper diet from your health care provider, if necessary.  Regular physical exercise is one of the most important things you can do for your health. Most adults should get at least 150 minutes of moderate-intensity exercise (any activity that increases your heart rate and causes you to sweat) each week. In addition, most adults need muscle-strengthening exercises on 2 or more days a week.   Maintain a healthy weight. The body mass index (BMI) is a screening tool to identify possible  weight problems. It provides an estimate of body fat based on height and weight. Your health care provider can find your BMI and can help you achieve or maintain a healthy weight. For males 20 years and older:  A BMI below 18.5 is considered underweight.  A BMI of 18.5 to 24.9 is normal.  A BMI of 25 to 29.9 is considered overweight.  A BMI of 30 and above is considered obese.  Maintain normal blood lipids and cholesterol by exercising and minimizing your intake of saturated fat. Eat a balanced diet with plenty of fruits and vegetables. Blood tests   for lipids and cholesterol should begin at age 20 and be repeated every 5 years. If your lipid or cholesterol levels are high, you are over age 50, or you are at high risk for heart disease, you may need your cholesterol levels checked more frequently.Ongoing high lipid and cholesterol levels should be treated with medicines if diet and exercise are not working.  If you smoke, find out from your health care provider how to quit. If you do not use tobacco, do not start.  Lung cancer screening is recommended for adults aged 55-80 years who are at high risk for developing lung cancer because of a history of smoking. A yearly low-dose CT scan of the lungs is recommended for people who have at least a 30-pack-year history of smoking and are current smokers or have quit within the past 15 years. A pack year of smoking is smoking an average of 1 pack of cigarettes a day for 1 year (for example, a 30-pack-year history of smoking could mean smoking 1 pack a day for 30 years or 2 packs a day for 15 years). Yearly screening should continue until the smoker has stopped smoking for at least 15 years. Yearly screening should be stopped for people who develop a health problem that would prevent them from having lung cancer treatment.  If you choose to drink alcohol, do not have more than 2 drinks per day. One drink is considered to be 12 oz (360 mL) of beer, 5 oz (150  mL) of wine, or 1.5 oz (45 mL) of liquor.  Avoid the use of street drugs. Do not share needles with anyone. Ask for help if you need support or instructions about stopping the use of drugs.  High blood pressure causes heart disease and increases the risk of stroke. High blood pressure is more likely to develop in:  People who have blood pressure in the end of the normal range (100-139/85-89 mm Hg).  People who are overweight or obese.  People who are African American.  If you are 18-39 years of age, have your blood pressure checked every 3-5 years. If you are 40 years of age or older, have your blood pressure checked every year. You should have your blood pressure measured twice-once when you are at a hospital or clinic, and once when you are not at a hospital or clinic. Record the average of the two measurements. To check your blood pressure when you are not at a hospital or clinic, you can use:  An automated blood pressure machine at a pharmacy.  A home blood pressure monitor.  If you are 45-79 years old, ask your health care provider if you should take aspirin to prevent heart disease.  Diabetes screening involves taking a blood sample to check your fasting blood sugar level. This should be done once every 3 years after age 45 if you are at a normal weight and without risk factors for diabetes. Testing should be considered at a younger age or be carried out more frequently if you are overweight and have at least 1 risk factor for diabetes.  Colorectal cancer can be detected and often prevented. Most routine colorectal cancer screening begins at the age of 50 and continues through age 75. However, your health care provider may recommend screening at an earlier age if you have risk factors for colon cancer. On a yearly basis, your health care provider may provide home test kits to check for hidden blood in the stool. A small camera   at the end of a tube may be used to directly examine the colon  (sigmoidoscopy or colonoscopy) to detect the earliest forms of colorectal cancer. Talk to your health care provider about this at age 50 when routine screening begins. A direct exam of the colon should be repeated every 5-10 years through age 75, unless early forms of precancerous polyps or small growths are found.  People who are at an increased risk for hepatitis B should be screened for this virus. You are considered at high risk for hepatitis B if:  You were born in a country where hepatitis B occurs often. Talk with your health care provider about which countries are considered high risk.  Your parents were born in a high-risk country and you have not received a shot to protect against hepatitis B (hepatitis B vaccine).  You have HIV or AIDS.  You use needles to inject street drugs.  You live with, or have sex with, someone who has hepatitis B.  You are a man who has sex with other men (MSM).  You get hemodialysis treatment.  You take certain medicines for conditions like cancer, organ transplantation, and autoimmune conditions.  Hepatitis C blood testing is recommended for all people born from 1945 through 1965 and any individual with known risk factors for hepatitis C.  Healthy men should no longer receive prostate-specific antigen (PSA) blood tests as part of routine cancer screening. Talk to your health care provider about prostate cancer screening.  Testicular cancer screening is not recommended for adolescents or adult males who have no symptoms. Screening includes self-exam, a health care provider exam, and other screening tests. Consult with your health care provider about any symptoms you have or any concerns you have about testicular cancer.  Practice safe sex. Use condoms and avoid high-risk sexual practices to reduce the spread of sexually transmitted infections (STIs).  You should be screened for STIs, including gonorrhea and chlamydia if:  You are sexually active and  are younger than 24 years.  You are older than 24 years, and your health care provider tells you that you are at risk for this type of infection.  Your sexual activity has changed since you were last screened, and you are at an increased risk for chlamydia or gonorrhea. Ask your health care provider if you are at risk.  If you are at risk of being infected with HIV, it is recommended that you take a prescription medicine daily to prevent HIV infection. This is called pre-exposure prophylaxis (PrEP). You are considered at risk if:  You are a man who has sex with other men (MSM).  You are a heterosexual man who is sexually active with multiple partners.  You take drugs by injection.  You are sexually active with a partner who has HIV.  Talk with your health care provider about whether you are at high risk of being infected with HIV. If you choose to begin PrEP, you should first be tested for HIV. You should then be tested every 3 months for as long as you are taking PrEP.  Use sunscreen. Apply sunscreen liberally and repeatedly throughout the day. You should seek shade when your shadow is shorter than you. Protect yourself by wearing long sleeves, pants, a wide-brimmed hat, and sunglasses year round whenever you are outdoors.  Tell your health care provider of new moles or changes in moles, especially if there is a change in shape or color. Also, tell your health care provider if a mole   is larger than the size of a pencil eraser.  A one-time screening for abdominal aortic aneurysm (AAA) and surgical repair of large AAAs by ultrasound is recommended for men aged 65-75 years who are current or former smokers.  Stay current with your vaccines (immunizations). This information is not intended to replace advice given to you by your health care provider. Make sure you discuss any questions you have with your health care provider. Document Released: 08/13/2007 Document Revised: 03/07/2014 Document  Reviewed: 11/18/2014 Elsevier Interactive Patient Education  2017 Elsevier Inc.  

## 2016-02-19 ENCOUNTER — Ambulatory Visit (INDEPENDENT_AMBULATORY_CARE_PROVIDER_SITE_OTHER): Payer: Medicare Other | Admitting: Cardiology

## 2016-02-19 ENCOUNTER — Encounter: Payer: Self-pay | Admitting: Cardiology

## 2016-02-19 VITALS — BP 126/72 | HR 83 | Ht 70.0 in | Wt 170.0 lb

## 2016-02-19 DIAGNOSIS — R002 Palpitations: Secondary | ICD-10-CM

## 2016-02-19 DIAGNOSIS — I10 Essential (primary) hypertension: Secondary | ICD-10-CM | POA: Diagnosis not present

## 2016-02-19 NOTE — Patient Instructions (Signed)
Medication Instructions:  Continue current medications  Labwork: None Ordered  Testing/Procedures: None Ordered  Follow-Up: Your physician wants you to follow-up in: 1 Year. You will receive a reminder letter in the mail two months in advance. If you don't receive a letter, please call our office to schedule the follow-up appointment.   Any Other Special Instructions Will Be Listed Below (If Applicable).         HAPPY HOLIDAY   If you need a refill on your cardiac medications before your next appointment, please call your pharmacy.   

## 2016-03-19 DIAGNOSIS — M25572 Pain in left ankle and joints of left foot: Secondary | ICD-10-CM | POA: Diagnosis not present

## 2016-03-31 LAB — HM DIABETES EYE EXAM

## 2016-04-08 DIAGNOSIS — E119 Type 2 diabetes mellitus without complications: Secondary | ICD-10-CM | POA: Diagnosis not present

## 2016-04-08 DIAGNOSIS — H43813 Vitreous degeneration, bilateral: Secondary | ICD-10-CM | POA: Diagnosis not present

## 2016-04-08 DIAGNOSIS — H25813 Combined forms of age-related cataract, bilateral: Secondary | ICD-10-CM | POA: Diagnosis not present

## 2016-04-12 DIAGNOSIS — E1165 Type 2 diabetes mellitus with hyperglycemia: Secondary | ICD-10-CM | POA: Diagnosis not present

## 2016-04-15 DIAGNOSIS — E1169 Type 2 diabetes mellitus with other specified complication: Secondary | ICD-10-CM | POA: Insufficient documentation

## 2016-04-15 DIAGNOSIS — E782 Mixed hyperlipidemia: Secondary | ICD-10-CM | POA: Insufficient documentation

## 2016-04-15 DIAGNOSIS — Z794 Long term (current) use of insulin: Secondary | ICD-10-CM | POA: Diagnosis not present

## 2016-04-15 DIAGNOSIS — E1165 Type 2 diabetes mellitus with hyperglycemia: Secondary | ICD-10-CM | POA: Diagnosis not present

## 2016-04-15 DIAGNOSIS — Z125 Encounter for screening for malignant neoplasm of prostate: Secondary | ICD-10-CM | POA: Diagnosis not present

## 2016-05-03 DIAGNOSIS — J069 Acute upper respiratory infection, unspecified: Secondary | ICD-10-CM | POA: Diagnosis not present

## 2016-05-03 DIAGNOSIS — E119 Type 2 diabetes mellitus without complications: Secondary | ICD-10-CM | POA: Diagnosis not present

## 2016-05-03 DIAGNOSIS — J101 Influenza due to other identified influenza virus with other respiratory manifestations: Secondary | ICD-10-CM | POA: Diagnosis not present

## 2016-05-03 DIAGNOSIS — Z79899 Other long term (current) drug therapy: Secondary | ICD-10-CM | POA: Diagnosis not present

## 2016-05-05 DIAGNOSIS — E119 Type 2 diabetes mellitus without complications: Secondary | ICD-10-CM | POA: Diagnosis not present

## 2016-05-23 DIAGNOSIS — H01001 Unspecified blepharitis right upper eyelid: Secondary | ICD-10-CM | POA: Diagnosis not present

## 2016-05-23 DIAGNOSIS — H01004 Unspecified blepharitis left upper eyelid: Secondary | ICD-10-CM | POA: Diagnosis not present

## 2016-06-07 DIAGNOSIS — M25572 Pain in left ankle and joints of left foot: Secondary | ICD-10-CM | POA: Diagnosis not present

## 2016-06-07 DIAGNOSIS — M76822 Posterior tibial tendinitis, left leg: Secondary | ICD-10-CM | POA: Diagnosis not present

## 2016-07-05 DIAGNOSIS — E119 Type 2 diabetes mellitus without complications: Secondary | ICD-10-CM | POA: Diagnosis not present

## 2016-07-08 DIAGNOSIS — M25572 Pain in left ankle and joints of left foot: Secondary | ICD-10-CM | POA: Diagnosis not present

## 2016-07-08 DIAGNOSIS — S93492D Sprain of other ligament of left ankle, subsequent encounter: Secondary | ICD-10-CM | POA: Diagnosis not present

## 2016-07-08 DIAGNOSIS — G5782 Other specified mononeuropathies of left lower limb: Secondary | ICD-10-CM | POA: Diagnosis not present

## 2016-07-18 DIAGNOSIS — E1165 Type 2 diabetes mellitus with hyperglycemia: Secondary | ICD-10-CM | POA: Diagnosis not present

## 2016-07-18 DIAGNOSIS — Z794 Long term (current) use of insulin: Secondary | ICD-10-CM | POA: Diagnosis not present

## 2016-07-18 DIAGNOSIS — E782 Mixed hyperlipidemia: Secondary | ICD-10-CM | POA: Diagnosis not present

## 2016-07-18 LAB — HEMOGLOBIN A1C: Hemoglobin A1C: 6.9

## 2016-07-20 ENCOUNTER — Other Ambulatory Visit: Payer: Self-pay | Admitting: Dermatology

## 2016-07-20 DIAGNOSIS — C44519 Basal cell carcinoma of skin of other part of trunk: Secondary | ICD-10-CM | POA: Diagnosis not present

## 2016-07-20 DIAGNOSIS — L570 Actinic keratosis: Secondary | ICD-10-CM | POA: Diagnosis not present

## 2016-07-20 DIAGNOSIS — D239 Other benign neoplasm of skin, unspecified: Secondary | ICD-10-CM | POA: Diagnosis not present

## 2016-07-20 DIAGNOSIS — L821 Other seborrheic keratosis: Secondary | ICD-10-CM | POA: Diagnosis not present

## 2016-07-22 DIAGNOSIS — E1165 Type 2 diabetes mellitus with hyperglycemia: Secondary | ICD-10-CM | POA: Diagnosis not present

## 2016-07-22 DIAGNOSIS — Z125 Encounter for screening for malignant neoplasm of prostate: Secondary | ICD-10-CM | POA: Diagnosis not present

## 2016-07-22 DIAGNOSIS — E782 Mixed hyperlipidemia: Secondary | ICD-10-CM | POA: Diagnosis not present

## 2016-07-22 DIAGNOSIS — Z794 Long term (current) use of insulin: Secondary | ICD-10-CM | POA: Diagnosis not present

## 2016-08-01 ENCOUNTER — Ambulatory Visit (INDEPENDENT_AMBULATORY_CARE_PROVIDER_SITE_OTHER): Payer: Medicare Other | Admitting: Ophthalmology

## 2016-08-04 ENCOUNTER — Encounter: Payer: Self-pay | Admitting: Internal Medicine

## 2016-08-05 ENCOUNTER — Ambulatory Visit (INDEPENDENT_AMBULATORY_CARE_PROVIDER_SITE_OTHER): Payer: Medicare Other | Admitting: Ophthalmology

## 2016-08-05 DIAGNOSIS — H353132 Nonexudative age-related macular degeneration, bilateral, intermediate dry stage: Secondary | ICD-10-CM | POA: Diagnosis not present

## 2016-08-05 DIAGNOSIS — H43813 Vitreous degeneration, bilateral: Secondary | ICD-10-CM | POA: Diagnosis not present

## 2016-08-05 DIAGNOSIS — E11319 Type 2 diabetes mellitus with unspecified diabetic retinopathy without macular edema: Secondary | ICD-10-CM | POA: Diagnosis not present

## 2016-08-05 DIAGNOSIS — E113291 Type 2 diabetes mellitus with mild nonproliferative diabetic retinopathy without macular edema, right eye: Secondary | ICD-10-CM | POA: Diagnosis not present

## 2016-09-06 DIAGNOSIS — L905 Scar conditions and fibrosis of skin: Secondary | ICD-10-CM | POA: Diagnosis not present

## 2016-09-06 DIAGNOSIS — Z85828 Personal history of other malignant neoplasm of skin: Secondary | ICD-10-CM | POA: Diagnosis not present

## 2016-09-06 DIAGNOSIS — L570 Actinic keratosis: Secondary | ICD-10-CM | POA: Diagnosis not present

## 2016-09-19 DIAGNOSIS — E119 Type 2 diabetes mellitus without complications: Secondary | ICD-10-CM | POA: Diagnosis not present

## 2016-10-03 DIAGNOSIS — R351 Nocturia: Secondary | ICD-10-CM | POA: Diagnosis not present

## 2016-10-17 DIAGNOSIS — E1165 Type 2 diabetes mellitus with hyperglycemia: Secondary | ICD-10-CM | POA: Diagnosis not present

## 2016-10-17 DIAGNOSIS — Z794 Long term (current) use of insulin: Secondary | ICD-10-CM | POA: Diagnosis not present

## 2016-10-17 DIAGNOSIS — E782 Mixed hyperlipidemia: Secondary | ICD-10-CM | POA: Diagnosis not present

## 2016-10-17 LAB — HEPATIC FUNCTION PANEL
ALK PHOS: 57 (ref 25–125)
ALT: 16 (ref 10–40)
AST: 20 (ref 14–40)
Bilirubin, Total: 0.8

## 2016-10-17 LAB — LIPID PANEL
CHOLESTEROL: 116 (ref 0–200)
HDL: 50 (ref 35–70)
LDL Cholesterol: 55
TRIGLYCERIDES: 84 (ref 40–160)

## 2016-10-17 LAB — BASIC METABOLIC PANEL
BUN: 17 (ref 4–21)
Creatinine: 0.8 (ref 0.6–1.3)
Glucose: 73
Potassium: 4.1 (ref 3.4–5.3)
SODIUM: 137 (ref 137–147)

## 2016-10-17 LAB — HEMOGLOBIN A1C: Hemoglobin A1C: 6.2

## 2016-10-19 DIAGNOSIS — N401 Enlarged prostate with lower urinary tract symptoms: Secondary | ICD-10-CM | POA: Diagnosis not present

## 2016-10-19 DIAGNOSIS — R351 Nocturia: Secondary | ICD-10-CM | POA: Diagnosis not present

## 2016-10-19 DIAGNOSIS — R350 Frequency of micturition: Secondary | ICD-10-CM | POA: Diagnosis not present

## 2016-10-19 DIAGNOSIS — N476 Balanoposthitis: Secondary | ICD-10-CM | POA: Diagnosis not present

## 2016-10-21 DIAGNOSIS — E782 Mixed hyperlipidemia: Secondary | ICD-10-CM | POA: Diagnosis not present

## 2016-10-21 DIAGNOSIS — Z125 Encounter for screening for malignant neoplasm of prostate: Secondary | ICD-10-CM | POA: Diagnosis not present

## 2016-10-21 DIAGNOSIS — E1165 Type 2 diabetes mellitus with hyperglycemia: Secondary | ICD-10-CM | POA: Diagnosis not present

## 2016-10-21 DIAGNOSIS — Z794 Long term (current) use of insulin: Secondary | ICD-10-CM | POA: Diagnosis not present

## 2016-11-02 ENCOUNTER — Encounter: Payer: Self-pay | Admitting: Internal Medicine

## 2016-11-21 DIAGNOSIS — S82822A Torus fracture of lower end of left fibula, initial encounter for closed fracture: Secondary | ICD-10-CM | POA: Diagnosis not present

## 2016-11-21 DIAGNOSIS — M76822 Posterior tibial tendinitis, left leg: Secondary | ICD-10-CM | POA: Diagnosis not present

## 2016-12-05 DIAGNOSIS — N476 Balanoposthitis: Secondary | ICD-10-CM | POA: Diagnosis not present

## 2016-12-07 DIAGNOSIS — M76822 Posterior tibial tendinitis, left leg: Secondary | ICD-10-CM | POA: Diagnosis not present

## 2016-12-07 DIAGNOSIS — S82822D Torus fracture of lower end of left fibula, subsequent encounter for fracture with routine healing: Secondary | ICD-10-CM | POA: Diagnosis not present

## 2016-12-12 DIAGNOSIS — Z23 Encounter for immunization: Secondary | ICD-10-CM | POA: Diagnosis not present

## 2016-12-20 ENCOUNTER — Telehealth: Payer: Self-pay | Admitting: Gastroenterology

## 2016-12-20 ENCOUNTER — Encounter (HOSPITAL_COMMUNITY): Payer: Self-pay | Admitting: Emergency Medicine

## 2016-12-20 ENCOUNTER — Inpatient Hospital Stay (HOSPITAL_COMMUNITY)
Admission: EM | Admit: 2016-12-20 | Discharge: 2016-12-23 | DRG: 394 | Disposition: A | Discharge status: Home / Self Care | Payer: Medicare Other | Attending: Internal Medicine | Admitting: Internal Medicine

## 2016-12-20 DIAGNOSIS — K921 Melena: Secondary | ICD-10-CM | POA: Diagnosis not present

## 2016-12-20 DIAGNOSIS — X58XXXA Exposure to other specified factors, initial encounter: Secondary | ICD-10-CM | POA: Diagnosis not present

## 2016-12-20 DIAGNOSIS — Z794 Long term (current) use of insulin: Secondary | ICD-10-CM | POA: Diagnosis not present

## 2016-12-20 DIAGNOSIS — K648 Other hemorrhoids: Secondary | ICD-10-CM | POA: Diagnosis not present

## 2016-12-20 DIAGNOSIS — I951 Orthostatic hypotension: Secondary | ICD-10-CM

## 2016-12-20 DIAGNOSIS — N400 Enlarged prostate without lower urinary tract symptoms: Secondary | ICD-10-CM | POA: Diagnosis present

## 2016-12-20 DIAGNOSIS — S82892A Other fracture of left lower leg, initial encounter for closed fracture: Secondary | ICD-10-CM | POA: Diagnosis not present

## 2016-12-20 DIAGNOSIS — D62 Acute posthemorrhagic anemia: Secondary | ICD-10-CM | POA: Diagnosis not present

## 2016-12-20 DIAGNOSIS — Z79899 Other long term (current) drug therapy: Secondary | ICD-10-CM | POA: Diagnosis not present

## 2016-12-20 DIAGNOSIS — D649 Anemia, unspecified: Secondary | ICD-10-CM | POA: Diagnosis not present

## 2016-12-20 DIAGNOSIS — R600 Localized edema: Secondary | ICD-10-CM | POA: Diagnosis present

## 2016-12-20 DIAGNOSIS — D125 Benign neoplasm of sigmoid colon: Secondary | ICD-10-CM

## 2016-12-20 DIAGNOSIS — Z7982 Long term (current) use of aspirin: Secondary | ICD-10-CM | POA: Diagnosis not present

## 2016-12-20 DIAGNOSIS — E119 Type 2 diabetes mellitus without complications: Secondary | ICD-10-CM | POA: Diagnosis present

## 2016-12-20 DIAGNOSIS — Z8249 Family history of ischemic heart disease and other diseases of the circulatory system: Secondary | ICD-10-CM

## 2016-12-20 DIAGNOSIS — K922 Gastrointestinal hemorrhage, unspecified: Secondary | ICD-10-CM | POA: Diagnosis not present

## 2016-12-20 DIAGNOSIS — K635 Polyp of colon: Secondary | ICD-10-CM | POA: Diagnosis not present

## 2016-12-20 DIAGNOSIS — K5730 Diverticulosis of large intestine without perforation or abscess without bleeding: Secondary | ICD-10-CM | POA: Diagnosis present

## 2016-12-20 DIAGNOSIS — I10 Essential (primary) hypertension: Secondary | ICD-10-CM | POA: Diagnosis present

## 2016-12-20 DIAGNOSIS — R609 Edema, unspecified: Secondary | ICD-10-CM

## 2016-12-20 DIAGNOSIS — R42 Dizziness and giddiness: Secondary | ICD-10-CM | POA: Diagnosis not present

## 2016-12-20 LAB — COMPREHENSIVE METABOLIC PANEL
ALBUMIN: 3.5 g/dL (ref 3.5–5.0)
ALT: 17 U/L (ref 17–63)
ANION GAP: 11 (ref 5–15)
AST: 23 U/L (ref 15–41)
Alkaline Phosphatase: 95 U/L (ref 38–126)
BILIRUBIN TOTAL: 0.7 mg/dL (ref 0.3–1.2)
BUN: 25 mg/dL — ABNORMAL HIGH (ref 6–20)
CHLORIDE: 103 mmol/L (ref 101–111)
CO2: 23 mmol/L (ref 22–32)
Calcium: 9.3 mg/dL (ref 8.9–10.3)
Creatinine, Ser: 0.94 mg/dL (ref 0.61–1.24)
GFR calc Af Amer: >60 mL/min (ref >60)
Glucose, Bld: 147 mg/dL — ABNORMAL HIGH (ref 65–99)
POTASSIUM: 4.5 mmol/L (ref 3.5–5.1)
Sodium: 137 mmol/L (ref 135–145)
TOTAL PROTEIN: 6.3 g/dL — AB (ref 6.5–8.1)

## 2016-12-20 LAB — CBC
HEMATOCRIT: 37.3 % — AB (ref 39.0–52.0)
HEMOGLOBIN: 12.4 g/dL — AB (ref 13.0–17.0)
MCH: 29.1 pg (ref 26.0–34.0)
MCHC: 33.2 g/dL (ref 30.0–36.0)
MCV: 87.6 fL (ref 78.0–100.0)
Platelets: 279 10*3/uL (ref 150–400)
RBC: 4.26 MIL/uL (ref 4.22–5.81)
RDW: 13.5 % (ref 11.5–15.5)
WBC: 9.3 10*3/uL (ref 4.0–10.5)

## 2016-12-20 LAB — I-STAT CG4 LACTIC ACID, ED
LACTIC ACID, VENOUS: 1.46 mmol/L (ref 0.5–1.9)
Lactic Acid, Venous: 2.22 mmol/L (ref 0.5–1.9)

## 2016-12-20 LAB — GLUCOSE, CAPILLARY
GLUCOSE-CAPILLARY: 94 mg/dL (ref 65–99)
GLUCOSE-CAPILLARY: 105 mg/dL — AB (ref 65–99)

## 2016-12-20 LAB — HEMOGLOBIN
Hemoglobin: 10.7 g/dL — ABNORMAL LOW (ref 13.0–17.0)
Hemoglobin: 9.7 g/dL — ABNORMAL LOW (ref 13.0–17.0)

## 2016-12-20 LAB — POC OCCULT BLOOD, ED: Fecal Occult Bld: POSITIVE — AB

## 2016-12-20 LAB — PROTIME-INR
INR: 1.02
PROTHROMBIN TIME: 13.3 s (ref 11.4–15.2)

## 2016-12-20 MED ORDER — PANTOPRAZOLE SODIUM 40 MG IV SOLR
40.0000 mg | Freq: Once | INTRAVENOUS | Status: AC
  Administered 2016-12-20: 40 mg via INTRAVENOUS
  Filled 2016-12-20: qty 40

## 2016-12-20 MED ORDER — VITAMIN C 500 MG PO TABS
1000.0000 mg | ORAL_TABLET | Freq: Every day | ORAL | Status: DC
  Administered 2016-12-21 – 2016-12-23 (×2): 1000 mg via ORAL
  Filled 2016-12-20 (×2): qty 2

## 2016-12-20 MED ORDER — PRAVASTATIN SODIUM 40 MG PO TABS
80.0000 mg | ORAL_TABLET | Freq: Every day | ORAL | Status: DC
  Administered 2016-12-20 – 2016-12-22 (×3): 80 mg via ORAL
  Filled 2016-12-20 (×3): qty 2

## 2016-12-20 MED ORDER — ADULT MULTIVITAMIN W/MINERALS CH
1.0000 | ORAL_TABLET | Freq: Every day | ORAL | Status: DC
  Administered 2016-12-21 – 2016-12-23 (×2): 1 via ORAL
  Filled 2016-12-20 (×2): qty 1

## 2016-12-20 MED ORDER — PIOGLITAZONE HCL 45 MG PO TABS
45.0000 mg | ORAL_TABLET | Freq: Every day | ORAL | Status: DC
  Administered 2016-12-21: 45 mg via ORAL
  Filled 2016-12-20 (×3): qty 1

## 2016-12-20 MED ORDER — HYDROCODONE-ACETAMINOPHEN 5-325 MG PO TABS: 1.0000 | ORAL_TABLET | Freq: Four times a day (QID) | ORAL | Status: DC | PRN

## 2016-12-20 MED ORDER — TAMSULOSIN HCL 0.4 MG PO CAPS
0.4000 mg | ORAL_CAPSULE | Freq: Every day | ORAL | Status: DC
Start: 2016-12-20 — End: 2016-12-23
  Administered 2016-12-20 – 2016-12-22 (×3): 0.4 mg via ORAL
  Filled 2016-12-20 (×4): qty 1

## 2016-12-20 MED ORDER — INSULIN GLARGINE 100 UNIT/ML ~~LOC~~ SOLN
18.0000 [IU] | Freq: Every day | SUBCUTANEOUS | Status: DC
  Filled 2016-12-20: qty 0.18

## 2016-12-20 MED ORDER — ACETAMINOPHEN 500 MG PO TABS: 1000.0000 mg | ORAL_TABLET | Freq: Every day | ORAL | Status: DC | PRN

## 2016-12-20 MED ORDER — PANTOPRAZOLE SODIUM 40 MG IV SOLR
40.0000 mg | Freq: Two times a day (BID) | INTRAVENOUS | Status: DC
  Administered 2016-12-20 – 2016-12-22 (×4): 40 mg via INTRAVENOUS
  Filled 2016-12-20 (×4): qty 40

## 2016-12-20 MED ORDER — DICLOFENAC SODIUM 1 % TD GEL
2.0000 g | Freq: Four times a day (QID) | TRANSDERMAL | Status: DC | PRN
  Filled 2016-12-20: qty 100

## 2016-12-20 MED ORDER — INSULIN ASPART 100 UNIT/ML ~~LOC~~ SOLN
0.0000 [IU] | Freq: Three times a day (TID) | SUBCUTANEOUS | Status: DC
  Administered 2016-12-21: 1 [IU] via SUBCUTANEOUS
  Administered 2016-12-23: 2 [IU] via SUBCUTANEOUS

## 2016-12-20 MED ORDER — LACTATED RINGERS IV BOLUS (SEPSIS)
1000.0000 mL | Freq: Once | INTRAVENOUS | Status: AC
  Administered 2016-12-20: 1000 mL via INTRAVENOUS

## 2016-12-20 NOTE — ED Notes (Signed)
Report given to floor RN

## 2016-12-20 NOTE — Telephone Encounter (Signed)
Agree with emergent ED evaluation.

## 2016-12-20 NOTE — ED Triage Notes (Signed)
Patient reports had three BMs this morning with bright red blood in toilet. Patient reports different then in past when had constipation and some bleeding after hard stool and wiped. Patient denies any abd pain or rectal pain. Patient reports takes daily ASA 81mg .

## 2016-12-20 NOTE — Telephone Encounter (Signed)
Patient reports "profuse" rectal bleeding. He stated it started this am.  He is advised to proceed to ED immediately.  He is instructed not to drive himself.  He verbalized understanding.

## 2016-12-20 NOTE — ED Provider Notes (Signed)
[facility] DEPT Provider Note   CSN: [id number] Arrival date & time: 12/20/16  0957     History   Chief Complaint Chief Complaint  Patient presents with  . Rectal Bleeding    HPI Scott Herman is a 83 y.o. male.  82 year old male with past medical history including type 2 diabetes mellitus, diverticulosis, GERD, HLD who presents with rectal bleeding.  This morning the patient has had 3 episodes of bright red blood per rectum.  He denies any associated rectal pain or abdominal pain.  He has never had bleeding like this before.  He states he was in his usual state of health yesterday and denies any fevers, vomiting, diarrhea, or recent illness.  No dizziness or lightheadedness.  No anticoagulant use other than 81 mg aspirin daily.   The history is provided by the patient.  Rectal Bleeding    Past Medical History:  Diagnosis Date  . Abnormal stress electrocardiogram test Jan 2013   Negative cardiac catheterization.   . Diabetes mellitus, type 2 (HCC)    Dr Altheimer  . Diverticulosis   . GERD (gastroesophageal reflux disease)   . Hiatal hernia   . Hyperlipidemia   . Normal cardiac stress test Jan 2009   Normal ETT  . Palpitations    Negative holter in 2009 except for tachycardia with bicycling    Patient Active Problem List   Diagnosis Date Noted  . BPH (benign prostatic hyperplasia) 01/12/2015  . HTN (hypertension) 06/26/2014  . Abnormal stress test 04/19/2011  . GERD 08/27/2009  . DIVERTICULOSIS-COLON 08/27/2009  . Diabetes ([place]) 05/28/2007  . MICROSCOPIC HEMATURIA 05/28/2007  . NOCTURIA 05/28/2007  . PALPITATIONS 02/09/2007    Past Surgical History:  Procedure Laterality Date  . CARDIAC CATHETERIZATION  Feb 2013   Mild nonobstructive CAD. Normal LV function  . CHOLECYSTECTOMY    . COLONOSCOPY     Dr Fuller Plan  . HEMORRHOID SURGERY    . INGUINAL HERNIA REPAIR    . ORIF CLAVICULAR FRACTURE     left  . TONSILLECTOMY          Home Medications    Prior to Admission medications   Medication Sig Start Date End Date Taking? Authorizing Provider  Ascorbic Acid (VITAMIN C) 1000 MG tablet Take 1,000 mg by mouth daily.    [provider]  aspirin 81 MG tablet Take 81 mg by mouth at bedtime.     [provider]  co-enzyme Q-10 30 MG capsule Take 30 mg by mouth daily.      [provider]  esomeprazole (NEXIUM) 40 MG capsule Take 1 capsule (40 mg total) by mouth daily before breakfast. 04/25/12   Ladene Artist, MD  Insulin Glargine (TOUJEO SOLOSTAR [place]) Inject 18 Units into the skin daily.     [provider]  lisinopril (PRINIVIL,ZESTRIL) 2.5 MG tablet Take 1 tablet (2.5 mg total) by mouth daily as needed. 06/26/14   Minus Breeding, MD  metFORMIN (GLUCOPHAGE-XR) 750 MG 24 hr tablet Take 750 mg by mouth 2 (two) times daily.      [provider]  Multiple Vitamin (MULTIVITAMIN) tablet Take 1 tablet by mouth daily.      [provider]  pioglitazone (ACTOS) 45 MG tablet Take 45 mg by mouth daily.    [provider]  pravastatin (PRAVACHOL) 80 MG tablet Take 80 mg by mouth at bedtime.     [provider]  Tamsulosin HCl (FLOMAX) 0.4 MG CAPS Take 0.4  mg by mouth daily.      [provider]  vitamin B-12 (CYANOCOBALAMIN) 1000 MCG tablet Take 1,000 mcg by mouth daily.    [provider]  vitamin E 1000 UNIT capsule Take 1,200 Units by mouth daily.    [provider]    Family History Family History  Problem Relation Age of Onset  . Prostate cancer Father   . Diabetes Father   . Diverticulosis Father   . Stroke Mother        > 9  . Hypertension Brother   . Diabetes Brother   . Prostate cancer Paternal Grandfather   . Diabetes Paternal Grandmother   . Colon cancer Neg Hx     Social History Social History  Substance Use Topics  . Smoking status: Never Smoker  . Smokeless tobacco: Never Used  . Alcohol use  No     Allergies   Patient has no known allergies.   Review of Systems Review of Systems  Gastrointestinal: Positive for hematochezia.   All other systems reviewed and are negative except that which was mentioned in HPI   Physical Exam Updated Vital Signs BP 120/80 (BP Location: Left Arm)   Pulse (!) 105   Temp 97.7 F (36.5 C) (Oral)   Resp 18   Ht 5\' 11"  (1.803 m)   Wt 79.4 kg (175 lb)   SpO2 97%   BMI 24.41 kg/m   Physical Exam  Constitutional: He is oriented to person, place, and time. He appears well-developed and well-nourished. No distress.  HENT:  Head: Normocephalic and atraumatic.  Moist mucous membranes  Eyes: Pupils are equal, round, and reactive to light.  Pale conjunctivae  Neck: Neck supple.  Cardiovascular: Regular rhythm and normal heart sounds.  Tachycardia present.   No murmur heard. Pulmonary/Chest: Effort normal and breath sounds normal.  Abdominal: Soft. Bowel sounds are normal. He exhibits no distension. There is no tenderness.  Genitourinary:  Genitourinary Comments: Normal rectal tone, bright red blood on digital rectal exam, no obvious external hemorrhoids  Musculoskeletal: He exhibits no edema.  Neurological: He is alert and oriented to person, place, and time.  Fluent speech  Skin: Skin is warm and dry.  Psychiatric: He has a normal mood and affect. Judgment normal.  Nursing note and vitals reviewed. Chaperone was present during exam.    ED Treatments / Results  Labs (all labs ordered are listed, but only abnormal results are displayed) Labs Reviewed  COMPREHENSIVE METABOLIC PANEL - Abnormal; Notable for the following:       Result Value   Glucose, Bld 147 (*)    BUN 25 (*)    Total Protein 6.3 (*)    All other components within normal limits  CBC - Abnormal; Notable for the following:    Hemoglobin 12.4 (*)    HCT 37.3 (*)    All other components within normal limits  POC OCCULT BLOOD, ED - Abnormal; Notable for the  following:    Fecal Occult Bld POSITIVE (*)    All other components within normal limits  I-STAT CG4 LACTIC ACID, ED - Abnormal; Notable for the following:    Lactic Acid, Venous 2.22 (*)    All other components within normal limits  HEMOGLOBIN  HEMOGLOBIN  I-STAT CG4 LACTIC ACID, ED  TYPE AND SCREEN    EKG  EKG Interpretation None       Radiology No results found.  Procedures Procedures (including critical care time)  Medications Ordered in ED Medications -  No data to display   Initial Impression / Assessment and Plan / ED Course  I have reviewed the triage vital signs and the nursing notes.  Pertinent labs & imaging results that were available during my care of the patient were reviewed by me and considered in my medical decision making (see chart for details).    Pt w/ 3 episodes BRBPR, he was comfortable on exam w/ normal BP, mild tachycardia.  He did have bright red blood on rectal exam but no associated abdominal tenderness.  Obtain above labs which showed initial lactate 2.22, BUN 25, creatinine 0.94, hemoglobin 12.4 which has decreased from previous of 15.  His findings are suggestive of acute GI bleed.  I have given 1 L of IV fluids for low normal pressure and patient has been stable without significant bleeding in the ED.  Discussed with Anderson Malta, APP w/ [facility], and they will see pt in consultation. Discussed admission w/ hospitalist, Dr. Erlinda Hong, and pt admitted for further care.  Final Clinical Impressions(s) / ED Diagnoses   Final diagnoses:  Acute GI bleeding    New Prescriptions New Prescriptions   No medications on file     Nina Hoar, Wenda Overland, MD 12/20/16 1635

## 2016-12-20 NOTE — Progress Notes (Signed)
Patient arrived on unit via stretcher from ED.  Wife at bedside.   

## 2016-12-20 NOTE — Consult Note (Signed)
Consultation  Referring Provider:  Dr. Rex Kras (ER)    Primary Care Physician:  Binnie Rail, MD Primary Gastroenterologist:   Dr. Fuller Plan      Reason for Consultation: Hematochezia             HPI:   Scott Herman is a 83 y.o. male with a past medical history as listed below including type 2 diabetes and diverticulosis, who presented to the ED this afternoon with complaint of rectal bleeding.   Today, the patient describes that he started with rectal bleeding this morning. Patient woke up to have his regular bowel movement which is typically a mixture between solid and somewhat loose stool, when he did this, he saw a lot of bright red blood on the toilet paper and in the toilet around 7:30 or 8:00 this morning. Patient had another loose/liquid bowel movement with bright red blood about 30 minutes later. He has not had one since. Patient denies any accompanying abdominal or rectal pain. Patient does note some constipation with a hard stool about 4-5 days ago. Patient did start Metamucil which helped. Patient does use an 81 mg aspirin for cardiac reasons on a daily basis but denies any other anticoagulation. He has been using Tylenol for some "leg pain", but denies any use of NSAIDs.   Patient denies fever, chills, weight loss, dizziness, SOB, heartburn or reflux.  Previous GI history: 05/08/12- Colonoscopy, Dr. Fuller Plan: Impression:  mild diverticulosis in the sigmoid colon, small internal hemorrhoids 10/09/09-EGD, Dr. Fuller Plan: Impression:  mild gastritis  Past Medical History:  Diagnosis Date  . Abnormal stress electrocardiogram test Jan 2013   Negative cardiac catheterization.   . Diabetes mellitus, type 2 (HCC)    Dr Altheimer  . Diverticulosis   . GERD (gastroesophageal reflux disease)   . Hiatal hernia   . Hyperlipidemia   . Normal cardiac stress test Jan 2009   Normal ETT  . Palpitations    Negative holter in 2009 except for tachycardia with bicycling    Past Surgical History:   Procedure Laterality Date  . CARDIAC CATHETERIZATION  Feb 2013   Mild nonobstructive CAD. Normal LV function  . CHOLECYSTECTOMY    . COLONOSCOPY     Dr Fuller Plan  . HEMORRHOID SURGERY    . INGUINAL HERNIA REPAIR    . ORIF CLAVICULAR FRACTURE     left  . TONSILLECTOMY      Family History  Problem Relation Age of Onset  . Prostate cancer Father   . Diabetes Father   . Diverticulosis Father   . Stroke Mother        > 21  . Hypertension Brother   . Diabetes Brother   . Prostate cancer Paternal Grandfather   . Diabetes Paternal Grandmother   . Colon cancer Neg Hx     Social History  Substance Use Topics  . Smoking status: Never Smoker  . Smokeless tobacco: Never Used  . Alcohol use No    Prior to Admission medications   Medication Sig Start Date End Date Taking? Authorizing Provider  acetaminophen (TYLENOL) 500 MG tablet Take 1,000 mg by mouth daily as needed for moderate pain.   Yes [provider]  Ascorbic Acid (VITAMIN C) 1000 MG tablet Take 1,000 mg by mouth daily.   Yes [provider]  aspirin 81 MG tablet Take 81 mg by mouth at bedtime.    Yes [provider]  CINNAMON PO Take 2,000 mg by mouth at bedtime.  Yes [provider]  co-enzyme Q-10 30 MG capsule Take 30 mg by mouth daily.     Yes [provider]  diclofenac sodium (VOLTAREN) 1 % GEL Apply 2 g topically 4 (four) times daily.   Yes [provider]  esomeprazole (NEXIUM) 40 MG capsule Take 1 capsule (40 mg total) by mouth daily before breakfast. Patient taking differently: Take 20 mg by mouth daily before breakfast.  04/25/12  Yes Ladene Artist, MD  HYDROcodone-acetaminophen (NORCO/VICODIN) 5-325 MG tablet Take 1 tablet by mouth every 6 (six) hours as needed for pain. 11/22/16  Yes [provider]  Insulin Glargine (TOUJEO SOLOSTAR [place]) Inject 24 Units into the skin daily.    Yes [provider]  lisinopril (PRINIVIL,ZESTRIL) 2.5 MG tablet  Take 1 tablet (2.5 mg total) by mouth daily as needed. 06/26/14  Yes Minus Breeding, MD  meloxicam (MOBIC) 15 MG tablet Take 15 mg by mouth daily. 12/13/16  Yes [provider]  metFORMIN (GLUCOPHAGE-XR) 750 MG 24 hr tablet Take 750 mg by mouth 2 (two) times daily.     Yes [provider]  Multiple Vitamins-Minerals (MENS 50+ MULTI VITAMIN/MIN) TABS Take 1 tablet by mouth daily.   Yes [provider]  pioglitazone (ACTOS) 45 MG tablet Take 45 mg by mouth daily.   Yes [provider]  pravastatin (PRAVACHOL) 80 MG tablet Take 80 mg by mouth at bedtime.    Yes [provider]  Tamsulosin HCl (FLOMAX) 0.4 MG CAPS Take 0.4 mg by mouth daily.     Yes [provider]    Current Facility-Administered Medications  Medication Dose Route Frequency Provider Last Rate Last Dose  . lactated ringers bolus 1,000 mL  1,000 mL Intravenous Once Little, Wenda Overland, MD       Current Outpatient Prescriptions  Medication Sig Dispense Refill  . acetaminophen (TYLENOL) 500 MG tablet Take 1,000 mg by mouth daily as needed for moderate pain.    . Ascorbic Acid (VITAMIN C) 1000 MG tablet Take 1,000 mg by mouth daily.    [name] Kitchen aspirin 81 MG tablet Take 81 mg by mouth at bedtime.     [name] Kitchen CINNAMON PO Take 2,000 mg by mouth at bedtime.    [name] Kitchen co-enzyme Q-10 30 MG capsule Take 30 mg by mouth daily.      . diclofenac sodium (VOLTAREN) 1 % GEL Apply 2 g topically 4 (four) times daily.    [name] Kitchen esomeprazole (NEXIUM) 40 MG capsule Take 1 capsule (40 mg total) by mouth daily before breakfast. (Patient taking differently: Take 20 mg by mouth daily before breakfast. ) 90 capsule 1  . HYDROcodone-acetaminophen (NORCO/VICODIN) 5-325 MG tablet Take 1 tablet by mouth every 6 (six) hours as needed for pain.  0  . Insulin Glargine (TOUJEO SOLOSTAR [place]) Inject 24 Units into the skin daily.     [name] Kitchen lisinopril (PRINIVIL,ZESTRIL) 2.5 MG tablet Take 1 tablet (2.5 mg total) by mouth daily as needed.  90 tablet 3  . meloxicam (MOBIC) 15 MG tablet Take 15 mg by mouth daily.  2  . metFORMIN (GLUCOPHAGE-XR) 750 MG 24 hr tablet Take 750 mg by mouth 2 (two) times daily.      . Multiple Vitamins-Minerals (MENS 50+ MULTI VITAMIN/MIN) TABS Take 1 tablet by mouth daily.    . pioglitazone (ACTOS) 45 MG tablet Take 45 mg by mouth daily.    . pravastatin (PRAVACHOL) 80 MG tablet Take 80 mg by mouth at bedtime.     [name] Kitchen  Tamsulosin HCl (FLOMAX) 0.4 MG CAPS Take 0.4 mg by mouth daily.        Allergies as of 12/20/2016  . (No Known Allergies)     Review of Systems:    Constitutional: No weight loss, fever or chills Skin: No rash  Cardiovascular: No chest pain Respiratory: No SOB Gastrointestinal: See HPI and otherwise negative Genitourinary: No dysuria Neurological: No headache, dizziness or syncope Musculoskeletal: No new muscle or joint pain Hematologic: No bruising Psychiatric: No history of depression or anxiety   Physical Exam:  Vital signs in last 24 hours: Temp:  [97.7 F (36.5 C)-98.1 F (36.7 C)] 98.1 F (36.7 C) (10/23 1203) Pulse Rate:  [86-105] 86 (10/23 1203) Resp:  [18-22] 22 (10/23 1203) BP: (91-120)/(63-80) 91/63 (10/23 1203) SpO2:  [97 %-98 %] 98 % (10/23 1203) Weight:  [175 lb (79.4 kg)] 175 lb (79.4 kg) (10/23 1005)   General:   Pleasant Caucasian male appears to be in NAD, Well developed, Well nourished, alert and cooperative Head:  Normocephalic and atraumatic. Eyes:   PEERL, EOMI. No icterus. Conjunctiva pink. Ears:  Normal auditory acuity. Neck:  Supple Throat: Oral cavity and pharynx without inflammation, swelling or lesion.  Lungs: Respirations even and unlabored. Lungs clear to auscultation bilaterally.   No wheezes, crackles, or rhonchi.  Heart: Normal S1, S2. No MRG. Regular rate and rhythm. No peripheral edema, cyanosis or pallor.  Abdomen:  Soft, nondistended, nontender. No rebound or guarding. Normal bowel sounds. No appreciable masses or  hepatomegaly. Rectal:  Per ER physician: normal rectal tone, bright red blood, no obvious external hemorrhoids Msk:  Symmetrical without gross deformities. Peripheral pulses intact.  Extremities:  Without edema, no deformity or joint abnormality. Normal ROM, normal sensation. Neurologic:  Alert and  oriented x4;  grossly normal neurologically. Skin:   Dry and intact without significant lesions or rashes. Psychiatric: Demonstrates good judgement and reason without abnormal affect or behaviors.   LAB RESULTS:  Recent Labs  12/20/16 1114  WBC 9.3  HGB 12.4*  HCT 37.3*  PLT 279   BMET  Recent Labs  12/20/16 1114  NA 137  K 4.5  CL 103  CO2 23  GLUCOSE 147*  BUN 25*  CREATININE 0.94  CALCIUM 9.3   LFT  Recent Labs  12/20/16 1114  PROT 6.3*  ALBUMIN 3.5  AST 23  ALT 17  ALKPHOS 95  BILITOT 0.7    PREVIOUS ENDOSCOPIES:            See HPI   Impression / Plan:   Impression: 1. Hematochezia: 2 episodes of bright red blood per rectum this morning, patient on 81 mg aspirin, hgb from around 13 in October of 2014 to 12.4 at time of presentation, orthostatic hypertension at time of presentation, BUN minimally increased to 25, h/o recent hard stools 4-5 days ago, improved on Metamucil per pt; Consider diverticular vs hemorrhoidal vs other 2. Mild Anemia: consider relation to above, no recent baseline  Plan: 1. At this time, will place patient on clear liquid diet, will consider Colonoscopy if patient with further signs of Acute GI Bleed overnight. 2. Continue other supportive measures 3. Continue to monitor hgb q8h with transfusion as needed 4. Please await final recommendations from Dr. Carlean Purl later today  Thank you for your kind consultation, we will continue to follow.  Lavone Nian Lemmon  12/20/2016, 1:18 PM Pager #: [phone]     [facility] Attending   I have taken an interval history, reviewed the chart  and examined the patient. I agree with the  Advanced Practitioner's note, impression and recommendations.   Will see how he does - if bleeding resolves will have outpatient f/u - if persiosts pursue inpatient colonoscopy.  Gatha Mayer, MD, [facility] [phone] (pager) 12/20/2016 6:03 PM

## 2016-12-20 NOTE — H&P (Signed)
History and Physical  Scott Herman KGU:[id number] DOB: [birth year] DOA: 12/20/2016  Referring physician: EDP PCP: Binnie Rail, MD   Chief Complaint: blood in stool  HPI: Scott Herman is a 83 y.o. male   With h/o insulin dependent diabetes, HTN, HLD, presented to the ED with above complaints. He denies abdominal pain, no n/v, no diarrhea, no fever. He called [facility] office and was instructed to come to the ED for further evaluation.  Ed course: he is found to have slight orthostasis on presentation. hgb 12.4. He received one liter of LR bolus. GI consulted who recommend observation in the hospital. hospitalist called.   Review of Systems:  Detail per HPI, Review of systems are otherwise negative  Past Medical History:  Diagnosis Date  . Abnormal stress electrocardiogram test Jan 2013   Negative cardiac catheterization.   . Diabetes mellitus, type 2 (HCC)    Dr Altheimer  . Diverticulosis   . GERD (gastroesophageal reflux disease)   . Hiatal hernia   . Hyperlipidemia   . Normal cardiac stress test Jan 2009   Normal ETT  . Palpitations    Negative holter in 2009 except for tachycardia with bicycling   Past Surgical History:  Procedure Laterality Date  . CARDIAC CATHETERIZATION  Feb 2013   Mild nonobstructive CAD. Normal LV function  . CHOLECYSTECTOMY    . COLONOSCOPY     Dr Fuller Plan  . HEMORRHOID SURGERY    . INGUINAL HERNIA REPAIR    . ORIF CLAVICULAR FRACTURE     left  . TONSILLECTOMY     Social History:  reports that he has never smoked. He has never used smokeless tobacco. He reports that he does not drink alcohol or use drugs. Patient lives at home & is able to participate in activities of daily living independently , walks with a cane.  No Known Allergies  Family History  Problem Relation Age of Onset  . Prostate cancer Father   . Diabetes Father   . Diverticulosis Father   . Stroke Mother        > 20  . Hypertension Brother   . Diabetes Brother   .  Prostate cancer Paternal Grandfather   . Diabetes Paternal Grandmother   . Colon cancer Neg Hx       Prior to Admission medications   Medication Sig Start Date End Date Taking? Authorizing Provider  acetaminophen (TYLENOL) 500 MG tablet Take 1,000 mg by mouth daily as needed for moderate pain.   Yes [provider]  Ascorbic Acid (VITAMIN C) 1000 MG tablet Take 1,000 mg by mouth daily.   Yes [provider]  aspirin 81 MG tablet Take 81 mg by mouth at bedtime.    Yes [provider]  CINNAMON PO Take 2,000 mg by mouth at bedtime.   Yes [provider]  co-enzyme Q-10 30 MG capsule Take 30 mg by mouth daily.     Yes [provider]  diclofenac sodium (VOLTAREN) 1 % GEL Apply 2 g topically 4 (four) times daily.   Yes [provider]  esomeprazole (NEXIUM) 40 MG capsule Take 1 capsule (40 mg total) by mouth daily before breakfast. Patient taking differently: Take 20 mg by mouth daily before breakfast.  04/25/12  Yes Ladene Artist, MD  HYDROcodone-acetaminophen (NORCO/VICODIN) 5-325 MG tablet Take 1 tablet by mouth every 6 (six) hours as needed for pain. 11/22/16  Yes [provider]  Insulin Glargine (TOUJEO SOLOSTAR [place]) Inject 24  Units into the skin daily.    Yes [provider]  lisinopril (PRINIVIL,ZESTRIL) 2.5 MG tablet Take 1 tablet (2.5 mg total) by mouth daily as needed. 06/26/14  Yes Minus Breeding, MD  meloxicam (MOBIC) 15 MG tablet Take 15 mg by mouth daily. 12/13/16  Yes [provider]  metFORMIN (GLUCOPHAGE-XR) 750 MG 24 hr tablet Take 750 mg by mouth 2 (two) times daily.     Yes [provider]  Multiple Vitamins-Minerals (MENS 50+ MULTI VITAMIN/MIN) TABS Take 1 tablet by mouth daily.   Yes [provider]  pioglitazone (ACTOS) 45 MG tablet Take 45 mg by mouth daily.   Yes [provider]  pravastatin (PRAVACHOL) 80 MG tablet Take 80 mg by mouth at bedtime.    Yes [provider]  Tamsulosin HCl (FLOMAX) 0.4 MG CAPS Take 0.4 mg by mouth daily.     Yes [provider]    Physical Exam: BP 91/63 (BP Location: Left Arm)   Pulse 86   Temp 98.1 F (36.7 C) (Oral)   Resp (!) 22   Ht 5\' 11"  (1.803 m)   Wt 79.4 kg (175 lb)   SpO2 98%   BMI 24.41 kg/m   General:  NAD Eyes: PERRL ENT: unremarkable Neck: supple, no JVD Cardiovascular: RRR Respiratory: CTABL Abdomen: soft/ND/ND, positive bowel sounds Skin: no rash Musculoskeletal:  In left ankle brace (reported ankle injury on 9/20), there is associated edema left lower leg Psychiatric: calm/cooperative Neurologic: no focal findings            Labs on Admission:  Basic Metabolic Panel:  Recent Labs Lab 12/20/16 1114  NA 137  K 4.5  CL 103  CO2 23  GLUCOSE 147*  BUN 25*  CREATININE 0.94  CALCIUM 9.3   Liver Function Tests:  Recent Labs Lab 12/20/16 1114  AST 23  ALT 17  ALKPHOS 95  BILITOT 0.7  PROT 6.3*  ALBUMIN 3.5   No results for input(s): LIPASE, AMYLASE in the last 168 hours. No results for input(s): AMMONIA in the last 168 hours. CBC:  Recent Labs Lab 12/20/16 1114  WBC 9.3  HGB 12.4*  HCT 37.3*  MCV 87.6  PLT 279   Cardiac Enzymes: No results for input(s): CKTOTAL, CKMB, CKMBINDEX, TROPONINI in the last 168 hours.  BNP (last 3 results) No results for input(s): BNP in the last 8760 hours.  ProBNP (last 3 results) No results for input(s): PROBNP in the last 8760 hours.  CBG: No results for input(s): GLUCAP in the last 168 hours.  Radiological Exams on Admission: No results found.  EKG: Independently reviewed. Sinus rhythm, no acute st/t changes, slightly peaked p wave  Assessment/Plan Present on Admission: . GI bleed  BRBPR:  -hgb 12.4 on admission, he does has orthostatic hypotension,  mildly elevated lactic acid and  mildly elevated BUN of  25 on presentation -He also takes asa, mobic on daily basis and use votarne gel to left  ankle - will hold mobic and asa, will give one dose of protonix now and started on ppi bid  -source of bleeding could be from hemorrhoids, diverticula, upper gi bleed could also be possible due to NSAIDS.  -monitor hgb, [facility] consulted, will follow gi recommendation.  HTN;  With orthostatic hypotension on presentation Hold lisinopril for now  Insulin dependent diabetes: Recent a1c 6.2 in 09/2016 Blood sugar 147 on presentation.  Hold metformin Continue long acting insulin (toujeo substitute) with reduced dose ( he is on  24units toujeo at home, started him on 18unit here), since he is only on clears, and possibly will need to be npo for procedure. Start ssi, hypoglycemia protocol  BPH; continue flomax  Left ankle fracture with left lower extremity edema: he declined venous US to r/o DVT.  DVT prophylaxis: scd's  Consultants: [facility]  Code Status: full   Family Communication:  Patient and family  Disposition Plan: obs med surg  Time spent: 5mins  Cyana Shook MD, PhD [facility] Pager [phone] If 7PM-7AM, please contact night-coverage at [email or web address], password [email or web address]

## 2016-12-20 NOTE — ED Notes (Signed)
Abnormal lab result MD Little have been made aware

## 2016-12-21 DIAGNOSIS — N400 Enlarged prostate without lower urinary tract symptoms: Secondary | ICD-10-CM | POA: Diagnosis present

## 2016-12-21 DIAGNOSIS — R42 Dizziness and giddiness: Secondary | ICD-10-CM | POA: Diagnosis present

## 2016-12-21 DIAGNOSIS — Z7982 Long term (current) use of aspirin: Secondary | ICD-10-CM | POA: Diagnosis not present

## 2016-12-21 DIAGNOSIS — X58XXXA Exposure to other specified factors, initial encounter: Secondary | ICD-10-CM | POA: Diagnosis not present

## 2016-12-21 DIAGNOSIS — K5730 Diverticulosis of large intestine without perforation or abscess without bleeding: Secondary | ICD-10-CM | POA: Diagnosis not present

## 2016-12-21 DIAGNOSIS — K922 Gastrointestinal hemorrhage, unspecified: Secondary | ICD-10-CM | POA: Diagnosis not present

## 2016-12-21 DIAGNOSIS — K921 Melena: Secondary | ICD-10-CM | POA: Diagnosis present

## 2016-12-21 DIAGNOSIS — I951 Orthostatic hypotension: Secondary | ICD-10-CM | POA: Diagnosis present

## 2016-12-21 DIAGNOSIS — K648 Other hemorrhoids: Secondary | ICD-10-CM | POA: Diagnosis not present

## 2016-12-21 DIAGNOSIS — S82892A Other fracture of left lower leg, initial encounter for closed fracture: Secondary | ICD-10-CM | POA: Diagnosis present

## 2016-12-21 DIAGNOSIS — Z8249 Family history of ischemic heart disease and other diseases of the circulatory system: Secondary | ICD-10-CM | POA: Diagnosis not present

## 2016-12-21 DIAGNOSIS — E119 Type 2 diabetes mellitus without complications: Secondary | ICD-10-CM | POA: Diagnosis present

## 2016-12-21 DIAGNOSIS — I10 Essential (primary) hypertension: Secondary | ICD-10-CM | POA: Diagnosis present

## 2016-12-21 DIAGNOSIS — K635 Polyp of colon: Secondary | ICD-10-CM | POA: Diagnosis not present

## 2016-12-21 DIAGNOSIS — D649 Anemia, unspecified: Secondary | ICD-10-CM | POA: Diagnosis not present

## 2016-12-21 DIAGNOSIS — Z794 Long term (current) use of insulin: Secondary | ICD-10-CM | POA: Diagnosis not present

## 2016-12-21 DIAGNOSIS — Z79899 Other long term (current) drug therapy: Secondary | ICD-10-CM | POA: Diagnosis not present

## 2016-12-21 DIAGNOSIS — D125 Benign neoplasm of sigmoid colon: Secondary | ICD-10-CM | POA: Diagnosis present

## 2016-12-21 DIAGNOSIS — R600 Localized edema: Secondary | ICD-10-CM | POA: Diagnosis present

## 2016-12-21 DIAGNOSIS — D62 Acute posthemorrhagic anemia: Secondary | ICD-10-CM | POA: Diagnosis not present

## 2016-12-21 LAB — RETICULOCYTES
RBC.: 3.44 MIL/uL — AB (ref 4.22–5.81)
RETIC COUNT ABSOLUTE: 51.6 10*3/uL (ref 19.0–186.0)
Retic Ct Pct: 1.5 % (ref 0.4–3.1)

## 2016-12-21 LAB — TYPE AND SCREEN
ABO/RH(D): O POS
ANTIBODY SCREEN: POSITIVE
DAT, IGG: POSITIVE
PT AG Type: POSITIVE

## 2016-12-21 LAB — BASIC METABOLIC PANEL
ANION GAP: 6 (ref 5–15)
BUN: 19 mg/dL (ref 6–20)
CALCIUM: 8.6 mg/dL — AB (ref 8.9–10.3)
CO2: 25 mmol/L (ref 22–32)
Chloride: 104 mmol/L (ref 101–111)
Creatinine, Ser: 0.72 mg/dL (ref 0.61–1.24)
GFR calc non Af Amer: >60 mL/min (ref >60)
Glucose, Bld: 102 mg/dL — ABNORMAL HIGH (ref 65–99)
POTASSIUM: 3.9 mmol/L (ref 3.5–5.1)
Sodium: 135 mmol/L (ref 135–145)

## 2016-12-21 LAB — HEMOGLOBIN
HEMOGLOBIN: 10.2 g/dL — AB (ref 13.0–17.0)
Hemoglobin: 9.9 g/dL — ABNORMAL LOW (ref 13.0–17.0)
Hemoglobin: 10 g/dL — ABNORMAL LOW (ref 13.0–17.0)

## 2016-12-21 LAB — FERRITIN: Ferritin: 38 ng/mL (ref 24–336)

## 2016-12-21 LAB — IRON AND TIBC
Iron: 53 ug/dL (ref 45–182)
Saturation Ratios: 18 % (ref 17.9–39.5)
TIBC: 294 ug/dL (ref 250–450)
UIBC: 241 ug/dL

## 2016-12-21 LAB — GLUCOSE, CAPILLARY
GLUCOSE-CAPILLARY: 103 mg/dL — AB (ref 65–99)
GLUCOSE-CAPILLARY: 106 mg/dL — AB (ref 65–99)
GLUCOSE-CAPILLARY: 151 mg/dL — AB (ref 65–99)
Glucose-Capillary: 92 mg/dL (ref 65–99)
Glucose-Capillary: 148 mg/dL — ABNORMAL HIGH (ref 65–99)

## 2016-12-21 LAB — VITAMIN B12: VITAMIN B 12: 477 pg/mL (ref 180–914)

## 2016-12-21 LAB — FOLATE: FOLATE: 31 ng/mL (ref >5.9)

## 2016-12-21 LAB — MAGNESIUM: Magnesium: 1.7 mg/dL (ref 1.7–2.4)

## 2016-12-21 MED ORDER — PEG-KCL-NACL-NASULF-NA ASC-C 100 G PO SOLR
0.5000 | Freq: Once | ORAL | Status: AC
  Administered 2016-12-21: 100 g via ORAL
  Filled 2016-12-21: qty 1

## 2016-12-21 MED ORDER — SODIUM CHLORIDE 0.9 % IV SOLN
INTRAVENOUS | Status: AC
  Administered 2016-12-21: 18:00:00 via INTRAVENOUS

## 2016-12-21 MED ORDER — INSULIN GLARGINE 100 UNIT/ML ~~LOC~~ SOLN
10.0000 [IU] | Freq: Two times a day (BID) | SUBCUTANEOUS | Status: DC
  Administered 2016-12-21 – 2016-12-23 (×4): 10 [IU] via SUBCUTANEOUS
  Filled 2016-12-21 (×6): qty 0.1

## 2016-12-21 MED ORDER — PEG-KCL-NACL-NASULF-NA ASC-C 100 G PO SOLR: 1.0000 | Freq: Once | ORAL | Status: DC

## 2016-12-21 MED ORDER — PEG-KCL-NACL-NASULF-NA ASC-C 100 G PO SOLR
0.5000 | Freq: Once | ORAL | Status: AC
  Administered 2016-12-21: 100 g via ORAL

## 2016-12-21 NOTE — Progress Notes (Signed)
Date:  December 21 2016 Chart reviewed for concurrent status and case management needs.  Will continue to follow patient progress.  Discharge Planning: following for needs  Expected discharge date: December 24, 2016  Velva Harman, BSN, [place], [place]

## 2016-12-21 NOTE — Progress Notes (Signed)
[facility] PROGRESS NOTE    Progress Note  Scott Herman  JHE:[id number] DOB: 23-Oct-1933 DOA: 12/20/2016 PCP: Binnie Rail, MD     Brief Narrative:   Scott Herman is an 83 y.o. male past medical history of diabetes mellitus insulin-dependent, essential hypertension, diverticulosis comes to the ED complaining of bright red blood per rectum of our GI has been consulted  Assessment/Plan:   Hematochezia/ Lower GI bleed Baseline hemoglobin around 13 on 2014 On admission it was 12.4, repeated CBC has drifted down to 9.9. GI was consulted, recommended colon prep for possible colonoscopy on 12/22/2016.  Acute blood loss anemia: significant drop from 13-9.9 check an anemia panel. Continue to monitor, the patient has remained asymptomatic.  Insulin-dependent diabetes mellitus: Last A1c of 14, continue to hold metformin. Continue sliding scale insulin plus long-acting insulin.  Essential hypertension/orthostatic hypotension: She was orthostatic on admission likely due to GI bleed,  ACE-I has been held continue IV fluid hydration.  Left ankle fracture with left lower extremity edema: Decline over extremity ltrasound  DVT prophylaxis: scd Family Communication:wife Disposition Plan/Barrier to D/C: home in 1-2 days Code Status:     Code Status Orders        Start     Ordered   12/20/16 1750  Full code  Continuous     12/20/16 1749    Code Status History    Date Active Date Inactive Code Status Order ID Comments User Context   This patient has a current code status but no historical code status.        IV Access:    Peripheral IV   Procedures and diagnostic studies:   No results found.   Medical Consultants:    None.  Anti-Infectives:   None  Subjective:    Scott Herman he denies any symptoms of shortness of breath or chest pain. He does feel minimally tired in general. No shortness of breath with ambulation.  Objective:     Vitals:   12/20/16 1753 12/20/16 1800 12/20/16 2138 12/21/16 0513  BP:  121/67 124/69 116/64  Pulse:  (!) 101 87 87  Resp:  20 16 16   Temp:  98.3 F (36.8 C) 97.9 F (36.6 C) 98.1 F (36.7 C)  TempSrc:  Oral Oral Oral  SpO2:  98% 97% 95%  Weight: 77.2 kg (170 lb 3.2 oz)     Height: 5\' 11"  (1.803 m)       Intake/Output Summary (Last 24 hours) at 12/21/16 0847 Last data filed at 12/21/16 0811  Gross per 24 hour  Intake             1580 ml  Output              254 ml  Net             1326 ml   Filed Weights   12/20/16 1005 12/20/16 1753  Weight: 79.4 kg (175 lb) 77.2 kg (170 lb 3.2 oz)    Exam: General exam: In no acute distress lying comfortably in bed. Respiratory system: Good air movement clear to auscultation. Cardiovascular system: Regular rate and rhythm with positive S1-S2 Gastrointestinal system: Abdomen is soft nontender nondistended. Central nervous system: Awake alert and oriented 3 nonfocal. Extremities: No lower extremity edema. Skin: No rashes, lesions or ulcers Psychiatry: Judgement and insight appear normal. Mood & affect appropriate.    Data Reviewed:    Labs: Basic Metabolic Panel:  Recent Labs Lab 12/20/16 1114 12/21/16 0739  NA 137 135  K 4.5 3.9  CL 103 104  CO2 23 25  GLUCOSE 147* 102*  BUN 25* 19  CREATININE 0.94 0.72  CALCIUM 9.3 8.6*  MG  --  1.7   GFR Estimated Creatinine Clearance: 77.1 mL/min (by C-G formula based on SCr of 0.72 mg/dL). Liver Function Tests:  Recent Labs Lab 12/20/16 1114  AST 23  ALT 17  ALKPHOS 95  BILITOT 0.7  PROT 6.3*  ALBUMIN 3.5   No results for input(s): LIPASE, AMYLASE in the last 168 hours. No results for input(s): AMMONIA in the last 168 hours. Coagulation profile  Recent Labs Lab 12/20/16 1708  INR 1.02    CBC:  Recent Labs Lab 12/20/16 1114 12/20/16 1708 12/20/16 2316 12/21/16 0739  WBC 9.3  --   --   --   HGB 12.4* 10.7* 9.7* 9.9*  HCT 37.3*  --   --   --   MCV  87.6  --   --   --   PLT 279  --   --   --    Cardiac Enzymes: No results for input(s): CKTOTAL, CKMB, CKMBINDEX, TROPONINI in the last 168 hours. BNP (last 3 results) No results for input(s): PROBNP in the last 8760 hours. CBG:  Recent Labs Lab 12/20/16 1821 12/20/16 2135 12/21/16 0156 12/21/16 0729  GLUCAP 94 105* 92 103*   D-Dimer: No results for input(s): DDIMER in the last 72 hours. Hgb A1c: No results for input(s): HGBA1C in the last 72 hours. Lipid Profile: No results for input(s): CHOL, HDL, LDLCALC, TRIG, CHOLHDL, LDLDIRECT in the last 72 hours. Thyroid function studies: No results for input(s): TSH, T4TOTAL, T3FREE, THYROIDAB in the last 72 hours.  Invalid input(s): FREET3 Anemia work up: No results for input(s): VITAMINB12, FOLATE, FERRITIN, TIBC, IRON, RETICCTPCT in the last 72 hours. Sepsis Labs:  Recent Labs Lab 12/20/16 1114 12/20/16 1128 12/20/16 1236  WBC 9.3  --   --   LATICACIDVEN  --  2.22* 1.46   Microbiology No results found for this or any previous visit (from the past 240 hour(s)).   Medications:   . insulin aspart  0-9 Units Subcutaneous TID WC  . insulin glargine  18 Units Subcutaneous Daily  . multivitamin with minerals  1 tablet Oral Daily  . pantoprazole (PROTONIX) IV  40 mg Intravenous Q12H  . pioglitazone  45 mg Oral Daily  . pravastatin  80 mg Oral QHS  . tamsulosin  0.4 mg Oral Daily  . vitamin C  1,000 mg Oral Daily   Continuous Infusions:    LOS: 0 days   Charlynne Cousins  [facility] Pager [phone]  *Please refer to [email or web address], password [email or web address] to get updated schedule on who will round on this patient, as hospitalists switch teams weekly. If 7PM-7AM, please contact night-coverage at [email or web address], password [email or web address] for any overnight needs.  12/21/2016, 8:47 AM

## 2016-12-21 NOTE — Progress Notes (Signed)
    Progress Note   Subjective  Chief Complaint: Hematochezia  Pt and his wife describe that he continued with bloody BMs throughout the night, at least 5-6 and into this morning, the last about 2 min ago. (this is observed in the toilet and is a mixture of brb with maroon clots and small pieces of stool) Patient continues to deny abdominal or rectal pain. He does endorse some dizziness upon standing.    Objective   Vital signs in last 24 hours: Temp:  [97.7 F (36.5 C)-98.3 F (36.8 C)] 98.1 F (36.7 C) (10/24 0513) Pulse Rate:  [86-126] 87 (10/24 0513) Resp:  [15-22] 16 (10/24 0513) BP: (91-129)/(63-81) 116/64 (10/24 0513) SpO2:  [87 %-98 %] 95 % (10/24 0513) Weight:  [170 lb 3.2 oz (77.2 kg)-175 lb (79.4 kg)] 170 lb 3.2 oz (77.2 kg) (10/23 1753) Last BM Date: 12/20/16 General:    Caucasian male in NAD Heart:  Regular rate and rhythm; no murmurs Lungs: Respirations even and unlabored, lungs CTA bilaterally Abdomen:  Soft, nontender and nondistended. Normal bowel sounds. Extremities:  Without edema. Neurologic:  Alert and oriented,  grossly normal neurologically. Psych:  Cooperative. Normal mood and affect.  Intake/Output from previous day: 10/23 0701 - 10/24 0700 In: 1580 [P.O.:580; IV Piggyback:1000] Out: 4 [Urine:3; Stool:1] Intake/Output this shift: Total I/O In: -  Out: 250 [Urine:250]  Lab Results:  Recent Labs  12/20/16 1114 12/20/16 1708 12/20/16 2316 12/21/16 0739  WBC 9.3  --   --   --   HGB 12.4* 10.7* 9.7* 9.9*  HCT 37.3*  --   --   --   PLT 279  --   --   --    BMET  Recent Labs  12/20/16 1114 12/21/16 0739  NA 137 135  K 4.5 3.9  CL 103 104  CO2 23 25  GLUCOSE 147* 102*  BUN 25* 19  CREATININE 0.94 0.72  CALCIUM 9.3 8.6*   LFT  Recent Labs  12/20/16 1114  PROT 6.3*  ALBUMIN 3.5  AST 23  ALT 17  ALKPHOS 95  BILITOT 0.7   PT/INR  Recent Labs  12/20/16 1708  LABPROT 13.3  INR 1.02     Assessment / Plan:    Assessment: 1. Hematochezia: multiple episodes of brbpr, last about 10 min ago, appears to be lower GI bleed; Consider most likely diverticular bleed 2. Anemia: hgb 12.4 -9.9 this morning with above  Plan: 1. Will proceed with colonoscopy tomorrow for further evaluation. Did discuss risks, benefits and limitations and the patient agrees to proceed.  2. If signs of brisk bleeding before that time, could consider bleeding scan 3. Continue supportive measures 4. Recommended family move bedside commode out to side of bed when wife leaves and patient should call for help to prevent falls 5. Continue to monitor hgb with transfusion <7 6. Please await final recommendations from Dr. Carlean Purl  Thank you for your kind consultation, we will continue to follow.   LOS: 0 days   Levin Erp  12/21/2016, 9:24 AM  Pager # [phone]    [facility] Attending   I have taken an interval history, reviewed the chart and examined the patient. I agree with the Advanced Practitioner's note, impression and recommendations.    Gatha Mayer, MD, [facility] [phone] (pager) 12/21/2016 7:43 PM

## 2016-12-22 ENCOUNTER — Encounter (HOSPITAL_COMMUNITY)
Admission: EM | Disposition: A | Discharge status: Home / Self Care | Payer: Self-pay | Source: Home / Self Care | Attending: Internal Medicine

## 2016-12-22 ENCOUNTER — Encounter (HOSPITAL_COMMUNITY): Payer: Self-pay | Admitting: *Deleted

## 2016-12-22 DIAGNOSIS — K635 Polyp of colon: Secondary | ICD-10-CM

## 2016-12-22 DIAGNOSIS — K5730 Diverticulosis of large intestine without perforation or abscess without bleeding: Secondary | ICD-10-CM

## 2016-12-22 DIAGNOSIS — K648 Other hemorrhoids: Secondary | ICD-10-CM

## 2016-12-22 DIAGNOSIS — D125 Benign neoplasm of sigmoid colon: Secondary | ICD-10-CM

## 2016-12-22 HISTORY — PX: COLONOSCOPY WITH PROPOFOL: SHX5780

## 2016-12-22 LAB — GLUCOSE, CAPILLARY
GLUCOSE-CAPILLARY: 78 mg/dL (ref 65–99)
GLUCOSE-CAPILLARY: 81 mg/dL (ref 65–99)
GLUCOSE-CAPILLARY: 116 mg/dL — AB (ref 65–99)
GLUCOSE-CAPILLARY: 59 mg/dL — AB (ref 65–99)
Glucose-Capillary: 70 mg/dL (ref 65–99)
Glucose-Capillary: 99 mg/dL (ref 65–99)
Glucose-Capillary: 71 mg/dL (ref 65–99)
Glucose-Capillary: 117 mg/dL — ABNORMAL HIGH (ref 65–99)

## 2016-12-22 LAB — CBC
HEMATOCRIT: 28.3 % — AB (ref 39.0–52.0)
Hemoglobin: 9.8 g/dL — ABNORMAL LOW (ref 13.0–17.0)
MCH: 30.1 pg (ref 26.0–34.0)
MCHC: 34.6 g/dL (ref 30.0–36.0)
MCV: 86.8 fL (ref 78.0–100.0)
Platelets: 234 10*3/uL (ref 150–400)
RBC: 3.26 MIL/uL — AB (ref 4.22–5.81)
RDW: 13.8 % (ref 11.5–15.5)
WBC: 7.8 10*3/uL (ref 4.0–10.5)

## 2016-12-22 LAB — HEMOGLOBIN
HEMOGLOBIN: 9.3 g/dL — AB (ref 13.0–17.0)
Hemoglobin: 9.3 g/dL — ABNORMAL LOW (ref 13.0–17.0)

## 2016-12-22 SURGERY — COLONOSCOPY WITH PROPOFOL
Anesthesia: Moderate Sedation

## 2016-12-22 MED ORDER — DEXTROSE 50 % IV SOLN
INTRAVENOUS | Status: AC
  Filled 2016-12-22: qty 50

## 2016-12-22 MED ORDER — FENTANYL CITRATE (PF) 100 MCG/2ML IJ SOLN
INTRAMUSCULAR | Status: AC
  Filled 2016-12-22: qty 2

## 2016-12-22 MED ORDER — DEXTROSE 50 % IV SOLN
INTRAVENOUS | Status: DC | PRN
  Administered 2016-12-22: 25 mL via INTRAVENOUS

## 2016-12-22 MED ORDER — PANTOPRAZOLE SODIUM 40 MG PO TBEC
40.0000 mg | DELAYED_RELEASE_TABLET | Freq: Every day | ORAL | Status: DC
  Administered 2016-12-23: 40 mg via ORAL
  Filled 2016-12-22: qty 1

## 2016-12-22 MED ORDER — IRON DEXTRAN 50 MG/ML IJ SOLN: 25.0000 mg | Freq: Once | INTRAMUSCULAR | Status: DC

## 2016-12-22 MED ORDER — MIDAZOLAM HCL 10 MG/2ML IJ SOLN
INTRAMUSCULAR | Status: DC | PRN
  Administered 2016-12-22 (×3): 2 mg via INTRAVENOUS
  Administered 2016-12-22: 1 mg via INTRAVENOUS

## 2016-12-22 MED ORDER — FENTANYL CITRATE (PF) 100 MCG/2ML IJ SOLN
INTRAMUSCULAR | Status: DC | PRN
  Administered 2016-12-22: 25 ug via INTRAVENOUS

## 2016-12-22 MED ORDER — SODIUM CHLORIDE 0.9 % IV SOLN
510.0000 mg | Freq: Once | INTRAVENOUS | Status: AC
  Administered 2016-12-22: 510 mg via INTRAVENOUS
  Filled 2016-12-22: qty 17

## 2016-12-22 MED ORDER — IRON DEXTRAN 50 MG/ML IJ SOLN
500.0000 mg | Freq: Once | INTRAMUSCULAR | Status: DC
Start: 2016-12-22 — End: 2016-12-22

## 2016-12-22 MED ORDER — MIDAZOLAM HCL 5 MG/ML IJ SOLN
INTRAMUSCULAR | Status: AC
  Filled 2016-12-22: qty 2

## 2016-12-22 MED ORDER — FENTANYL CITRATE (PF) 100 MCG/2ML IJ SOLN
INTRAMUSCULAR | Status: DC | PRN
  Administered 2016-12-22 (×2): 25 ug via INTRAVENOUS

## 2016-12-22 SURGICAL SUPPLY — 22 items

## 2016-12-22 NOTE — Op Note (Signed)
[facility] Patient Name: Scott Herman Procedure Date: 12/22/2016 MRN: [id number] Attending MD: Gatha Mayer , MD Date of Birth: 1934-02-05 CSN: [id number] Age: 83 Admit Type: Inpatient Procedure:                Colonoscopy Indications:              Hematochezia Providers:                Gatha Mayer, MD, Zenon Mayo, RN, Alan Mulder, Technician Referring MD:              Medicines:                Midazolam 7 mg IV, Fentanyl 75 micrograms IV Complications:            No immediate complications. Estimated Blood Loss:     Estimated blood loss was minimal. Procedure:                Pre-Anesthesia Assessment:                           - Prior to the procedure, a History and Physical                            was performed, and patient medications and                            allergies were reviewed. The patient's tolerance of                            previous anesthesia was also reviewed. The risks                            and benefits of the procedure and the sedation                            options and risks were discussed with the patient.                            All questions were answered, and informed consent                            was obtained. Prior Anticoagulants: The patient has                            taken no previous anticoagulant or antiplatelet                            agents. ASA Grade Assessment: III - A patient with                            severe systemic disease. After reviewing the risks  and benefits, the patient was deemed in                            satisfactory condition to undergo the procedure.                           After obtaining informed consent, the colonoscope                            was passed under direct vision. Throughout the                            procedure, the patient's blood pressure, pulse, and                            oxygen  saturations were monitored continuously. The                            EC-[id number] ([id number]) scope was introduced through                            the anus and advanced to the the cecum, identified                            by appendiceal orifice and ileocecal valve. The                            colonoscopy was performed without difficulty. The                            patient tolerated the procedure well. The quality                            of the bowel preparation was good. The ileocecal                            valve, appendiceal orifice, and rectum were                            photographed. Scope In: 3:31:28 PM Scope Out: 3:47:25 PM Scope Withdrawal Time: 0 hours 9 minutes 48 seconds  Total Procedure Duration: 0 hours 15 minutes 57 seconds  Findings:      The perianal and digital rectal examinations were normal.      A diminutive polyp was found in the sigmoid colon. The polyp was       sessile. The polyp was removed with a cold snare. Resection and       retrieval were complete. Verification of patient identification for the       specimen was done. Estimated blood loss was minimal.      Multiple small and large-mouthed diverticula were found in the sigmoid       colon.      Internal hemorrhoids were found during retroflexion.      The exam was otherwise without abnormality on direct and retroflexion       views.  Impression:               - One diminutive polyp in the sigmoid colon,                            removed with a cold snare. Resected and retrieved.                           - Diverticulosis in the sigmoid colon.                           - Internal hemorrhoids.                           - The examination was otherwise normal on direct                            and retroflexion views. Moderate Sedation:      Moderate (conscious) sedation was administered by the endoscopy nurse       and supervised by the endoscopist. The following parameters were        monitored: oxygen saturation, heart rate, blood pressure, respiratory       rate, EKG, adequacy of pulmonary ventilation, and response to care.       Total physician intraservice time was 20 minutes. Recommendation:           - Return patient to hospital ward for observation.                           - Diabetic (ADA) diet.                           - Continue present medications.                           - No repeat colonoscopy due to age.                           - Home tomorrow unless more bleeding.                           Would avoid meloxicam and ASA x 1 week'                           F/U GI prn                           See pcp and f/u Hgb, anemia, etc                           was given feraheme here Procedure Code(s):        --- Professional ---                           [phone], Colonoscopy, flexible; with removal of                            tumor(s),  polyp(s), or other lesion(s) by snare                            technique Diagnosis Code(s):        --- Professional ---                           D12.5, Benign neoplasm of sigmoid colon                           K64.8, Other hemorrhoids                           K92.1, Melena (includes Hematochezia)                           K57.30, Diverticulosis of large intestine without                            perforation or abscess without bleeding CPT copyright 2016 [employer]. All rights reserved. The codes documented in this report are preliminary and upon coder review may  be revised to meet current compliance requirements. Gatha Mayer, MD 12/22/2016 4:07:58 PM This report has been signed electronically. Number of Addenda: 0

## 2016-12-22 NOTE — Progress Notes (Signed)
Hypoglycemic Event  CBG: 59  Treatment: D50 IV 25 mL  Symptoms: Drowsy  Follow-up CBG: Time: 1525 CBG Result:116  Possible Reasons for Event: Inadequate meal intake  Comments/MD notified: Dr. Carlean Purl aware    Kandee Keen

## 2016-12-22 NOTE — H&P (View-Only) (Signed)
    Progress Note   Subjective  Chief Complaint: Hematochezia  Pt and his wife describe that he continued with bloody BMs throughout the night, at least 5-6 and into this morning, the last about 2 min ago. (this is observed in the toilet and is a mixture of brb with maroon clots and small pieces of stool) Patient continues to deny abdominal or rectal pain. He does endorse some dizziness upon standing.    Objective   Vital signs in last 24 hours: Temp:  [97.7 F (36.5 C)-98.3 F (36.8 C)] 98.1 F (36.7 C) (10/24 0513) Pulse Rate:  [86-126] 87 (10/24 0513) Resp:  [15-22] 16 (10/24 0513) BP: (91-129)/(63-81) 116/64 (10/24 0513) SpO2:  [87 %-98 %] 95 % (10/24 0513) Weight:  [170 lb 3.2 oz (77.2 kg)-175 lb (79.4 kg)] 170 lb 3.2 oz (77.2 kg) (10/23 1753) Last BM Date: 12/20/16 General:    Caucasian male in NAD Heart:  Regular rate and rhythm; no murmurs Lungs: Respirations even and unlabored, lungs CTA bilaterally Abdomen:  Soft, nontender and nondistended. Normal bowel sounds. Extremities:  Without edema. Neurologic:  Alert and oriented,  grossly normal neurologically. Psych:  Cooperative. Normal mood and affect.  Intake/Output from previous day: 10/23 0701 - 10/24 0700 In: 1580 [P.O.:580; IV Piggyback:1000] Out: 4 [Urine:3; Stool:1] Intake/Output this shift: Total I/O In: -  Out: 250 [Urine:250]  Lab Results:  Recent Labs  12/20/16 1114 12/20/16 1708 12/20/16 2316 12/21/16 0739  WBC 9.3  --   --   --   HGB 12.4* 10.7* 9.7* 9.9*  HCT 37.3*  --   --   --   PLT 279  --   --   --    BMET  Recent Labs  12/20/16 1114 12/21/16 0739  NA 137 135  K 4.5 3.9  CL 103 104  CO2 23 25  GLUCOSE 147* 102*  BUN 25* 19  CREATININE 0.94 0.72  CALCIUM 9.3 8.6*   LFT  Recent Labs  12/20/16 1114  PROT 6.3*  ALBUMIN 3.5  AST 23  ALT 17  ALKPHOS 95  BILITOT 0.7   PT/INR  Recent Labs  12/20/16 1708  LABPROT 13.3  INR 1.02     Assessment / Plan:    Assessment: 1. Hematochezia: multiple episodes of brbpr, last about 10 min ago, appears to be lower GI bleed; Consider most likely diverticular bleed 2. Anemia: hgb 12.4 -9.9 this morning with above  Plan: 1. Will proceed with colonoscopy tomorrow for further evaluation. Did discuss risks, benefits and limitations and the patient agrees to proceed.  2. If signs of brisk bleeding before that time, could consider bleeding scan 3. Continue supportive measures 4. Recommended family move bedside commode out to side of bed when wife leaves and patient should call for help to prevent falls 5. Continue to monitor hgb with transfusion <7 6. Please await final recommendations from Dr. Carlean Purl  Thank you for your kind consultation, we will continue to follow.   LOS: 0 days   Levin Erp  12/21/2016, 9:24 AM  Pager # [phone]    [facility] Attending   I have taken an interval history, reviewed the chart and examined the patient. I agree with the Advanced Practitioner's note, impression and recommendations.    Gatha Mayer, MD, [facility] [phone] (pager) 12/21/2016 7:43 PM

## 2016-12-22 NOTE — Interval H&P Note (Signed)
History and Physical Interval Note:  12/22/2016 3:21 PM  Scott Herman  has presented today for surgery, with the diagnosis of Hematochezia, ABLA  The various methods of treatment have been discussed with the patient and family. After consideration of risks, benefits and other options for treatment, the patient has consented to  Procedure(s): COLONOSCOPY WITH PROPOFOL (N/A) as a surgical intervention .  The patient's history has been reviewed, patient examined, no change in status, stable for surgery.  I have reviewed the patient's chart and labs.  Questions were answered to the patient's satisfaction.     [name]

## 2016-12-22 NOTE — Progress Notes (Signed)
[facility] PROGRESS NOTE    Progress Note  Scott Herman  LFY:[id number] DOB: 01-15-1934 DOA: 12/20/2016 PCP: Binnie Rail, MD     Brief Narrative:   Scott Herman is an 83 y.o. male past medical history of diabetes mellitus insulin-dependent, essential hypertension, diverticulosis comes to the ED complaining of bright red blood per rectum of our GI has been consulted  Assessment/Plan:   Hematochezia/ Lower GI bleed Baseline hemoglobin around 13 on 2014 On admission it was 12.4, repeated CBC has drifted down to 9.9. For colonoscopy in 12/23/2016.  Acute blood loss anemia: significant drop from 13-9.9 , ferritin 38 we'll give IV iron. Continue to monitor, the patient has remained asymptomatic.  Insulin-dependent diabetes mellitus: Last A1c of 14, continue to hold metformin. Excellent controlled blood glucose in house. Continue sliding scale insulin plus long-acting insulin.  Essential hypertension/orthostatic hypotension: She was orthostatic on admission likely due to GI bleed,  ACE-I has been held continue IV fluid hydration.  Left ankle fracture with left lower extremity edema: Decline over extremity ultrasound  DVT prophylaxis: scd Family Communication:wife Disposition Plan/Barrier to D/C: home in 1-2 days Code Status:     Code Status Orders        Start     Ordered   12/20/16 1750  Full code  Continuous     12/20/16 1749    Code Status History    Date Active Date Inactive Code Status Order ID Comments User Context   This patient has a current code status but no historical code status.        IV Access:    Peripheral IV   Procedures and diagnostic studies:   No results found.   Medical Consultants:    None.  Anti-Infectives:   None  Subjective:    [name] no new complaints no chest pain or shortness of breath.  Objective:    Vitals:   12/21/16 0513 12/21/16 1421 12/21/16 2113 12/22/16 0437  BP: 116/64  128/69 (!) 146/73 118/61  Pulse: 87 80 93 86  Resp: 16 18 20 18   Temp: 98.1 F (36.7 C) 97.6 F (36.4 C) 97.7 F (36.5 C) 97.8 F (36.6 C)  TempSrc: Oral Oral Oral Oral  SpO2: 95% 97% 100% 98%  Weight:      Height:        Intake/Output Summary (Last 24 hours) at 12/22/16 1037 Last data filed at 12/22/16 0900  Gross per 24 hour  Intake             1050 ml  Output              350 ml  Net              700 ml   Filed Weights   12/20/16 1005 12/20/16 1753  Weight: 79.4 kg (175 lb) 77.2 kg (170 lb 3.2 oz)    Exam: General exam: In no acute distress lying comfortably in bed. Respiratory system: Good air movement clear to auscultation. Cardiovascular system: Regular rate and rhythm with positive S1-S2 Gastrointestinal system: Abdomen is soft nontender nondistended. Central nervous system: Awake alert and oriented 3 nonfocal. Extremities: No lower extremity edema. Skin: No rashes, lesions or ulcers Psychiatry: Judgement and insight appear normal. Mood & affect appropriate.    Data Reviewed:    Labs: Basic Metabolic Panel:  Recent Labs Lab 12/20/16 1114 12/21/16 0739  NA 137 135  K 4.5 3.9  CL 103 104  CO2 23 25  GLUCOSE 147* 102*  BUN 25* 19  CREATININE 0.94 0.72  CALCIUM 9.3 8.6*  MG  --  1.7   GFR Estimated Creatinine Clearance: 77.1 mL/min (by C-G formula based on SCr of 0.72 mg/dL). Liver Function Tests:  Recent Labs Lab 12/20/16 1114  AST 23  ALT 17  ALKPHOS 95  BILITOT 0.7  PROT 6.3*  ALBUMIN 3.5   No results for input(s): LIPASE, AMYLASE in the last 168 hours. No results for input(s): AMMONIA in the last 168 hours. Coagulation profile  Recent Labs Lab 12/20/16 1708  INR 1.02    CBC:  Recent Labs Lab 12/20/16 1114  12/20/16 2316 12/21/16 0739 12/21/16 1421 12/21/16 2308 12/22/16 0702  WBC 9.3  --   --   --   --   --  7.8  HGB 12.4*  < > 9.7* 9.9* 10.2* 10.0* 9.8*  HCT 37.3*  --   --   --   --   --  28.3*  MCV 87.6  --   --    --   --   --  86.8  PLT 279  --   --   --   --   --  234  < > = values in this interval not displayed. Cardiac Enzymes: No results for input(s): CKTOTAL, CKMB, CKMBINDEX, TROPONINI in the last 168 hours. BNP (last 3 results) No results for input(s): PROBNP in the last 8760 hours. CBG:  Recent Labs Lab 12/21/16 1133 12/21/16 1713 12/21/16 2116 12/22/16 0546 12/22/16 0715  GLUCAP 148* 106* 151* 78 81   D-Dimer: No results for input(s): DDIMER in the last 72 hours. Hgb A1c: No results for input(s): HGBA1C in the last 72 hours. Lipid Profile: No results for input(s): CHOL, HDL, LDLCALC, TRIG, CHOLHDL, LDLDIRECT in the last 72 hours. Thyroid function studies: No results for input(s): TSH, T4TOTAL, T3FREE, THYROIDAB in the last 72 hours.  Invalid input(s): FREET3 Anemia work up:  Recent Labs  12/21/16 0916  VITAMINB12 477  FOLATE 31.0  FERRITIN 38  TIBC 294  IRON 53  RETICCTPCT 1.5   Sepsis Labs:  Recent Labs Lab 12/20/16 1114 12/20/16 1128 12/20/16 1236 12/22/16 0702  WBC 9.3  --   --  7.8  LATICACIDVEN  --  2.22* 1.46  --    Microbiology No results found for this or any previous visit (from the past 240 hour(s)).   Medications:   . insulin aspart  0-9 Units Subcutaneous TID WC  . insulin glargine  10 Units Subcutaneous BID  . multivitamin with minerals  1 tablet Oral Daily  . pantoprazole (PROTONIX) IV  40 mg Intravenous Q12H  . pioglitazone  45 mg Oral Daily  . pravastatin  80 mg Oral QHS  . tamsulosin  0.4 mg Oral Daily  . vitamin C  1,000 mg Oral Daily   Continuous Infusions: . sodium chloride 75 mL/hr at 12/21/16 1743      LOS: 1 day   Charlynne Cousins  [facility] Pager [phone]  *Please refer to [email or web address], password [email or web address] to get updated schedule on who will round on this patient, as hospitalists switch teams weekly. If 7PM-7AM, please contact night-coverage at [email or web address], password [email or web address] for any overnight needs.  12/22/2016,  10:37 AM

## 2016-12-23 ENCOUNTER — Encounter (HOSPITAL_COMMUNITY): Payer: Self-pay | Admitting: Internal Medicine

## 2016-12-23 LAB — HEMOGLOBIN: Hemoglobin: 9.9 g/dL — ABNORMAL LOW (ref 13.0–17.0)

## 2016-12-23 LAB — GLUCOSE, CAPILLARY
GLUCOSE-CAPILLARY: 161 mg/dL — AB (ref 65–99)
Glucose-Capillary: 72 mg/dL (ref 65–99)

## 2016-12-23 MED ORDER — ASPIRIN 81 MG PO TABS: 81.0000 mg | ORAL_TABLET | Freq: Every day | ORAL | Status: AC

## 2016-12-23 MED ORDER — MELOXICAM 15 MG PO TABS: 15.0000 mg | ORAL_TABLET | Freq: Every day | ORAL | 2 refills | Status: DC

## 2016-12-23 MED ORDER — DICLOFENAC SODIUM 1 % TD GEL: 2.0000 g | Freq: Four times a day (QID) | TRANSDERMAL | Status: DC

## 2016-12-23 NOTE — Discharge Summary (Signed)
Physician Discharge Summary  Scott Herman:[id number] DOB: October 08, 1933 DOA: 12/20/2016  PCP: Binnie Rail, MD  Admit date: 12/20/2016 Discharge date: 12/23/2016  Admitted From: home Disposition:  Home  Recommendations for Outpatient Follow-up:  1. Follow up with GI in 1-2 weeks 2. Please obtain BMP/CBC in one week   Home Health:No Equipment/Devices:none  Discharge Condition:stable CODE STATUS:full Diet recommendation: Heart Healthy   Brief/Interim Summary: 83 y.o. male past medical history of diabetes mellitus insulin-dependent, essential hypertension, diverticulosis comes to the ED complaining of bright red blood per rectum of our GI has been consulted  Discharge Diagnoses:  Active Problems:   Lower GI bleed   Acute blood loss anemia   Benign neoplasm of sigmoid colon  Hematochezia lower GI bleed: Baseline hemoglobin around 13 on admission to the hospital was 12.4 he continued to have melanotic stools and a drop to 9.9, GI was consulted who recommended a colonoscopy in 12/23/2016 that showed one polyp are multiple diverticuli. He will stay off aspirin and NSAIDs for 1 week.  Acute blood loss anemia: Significant drop from 13-9.9 his ferritin was 38 he was given IV iron. He will need a follow-up CBC in 6 weeks.  Insulin-dependent diabetes mellitus: No changes were made to his medication only to follow-up with PCP.  Essential hypertension/orthostatic hypotension: His antihypertensive medications were held and admission, he was fluid resuscitated his orthostasis resolved. He will resume his antihypertensive medications as an outpatient.    Discharge Instructions  Discharge Instructions    Diet - low sodium heart healthy    Complete by:  As directed    Increase activity slowly    Complete by:  As directed      Allergies as of 12/23/2016   No Known Allergies     Medication List    TAKE these medications   aspirin 81 MG tablet Take 1 tablet (81 mg  total) by mouth at bedtime. Start taking on:  12/29/2016 What changed:  These instructions start on 12/29/2016. If you are unsure what to do until then, ask your doctor or other care provider.   CINNAMON PO Take 2,000 mg by mouth at bedtime.   co-enzyme Q-10 30 MG capsule Take 30 mg by mouth daily.   diclofenac sodium 1 % Gel Commonly known as:  VOLTAREN Apply 2 g topically 4 (four) times daily. Start taking on:  12/29/2016 What changed:  These instructions start on 12/29/2016. If you are unsure what to do until then, ask your doctor or other care provider.   esomeprazole 40 MG capsule Commonly known as:  NEXIUM Take 1 capsule (40 mg total) by mouth daily before breakfast. What changed:  how much to take   FLOMAX 0.4 MG Caps capsule Generic drug:  tamsulosin Take 0.4 mg by mouth daily.   HYDROcodone-acetaminophen 5-325 MG tablet Commonly known as:  NORCO/VICODIN Take 1 tablet by mouth every 6 (six) hours as needed for pain.   lisinopril 2.5 MG tablet Commonly known as:  PRINIVIL,ZESTRIL Take 1 tablet (2.5 mg total) by mouth daily as needed.   meloxicam 15 MG tablet Commonly known as:  MOBIC Take 1 tablet (15 mg total) by mouth daily. Start taking on:  12/29/2016 What changed:  These instructions start on 12/29/2016. If you are unsure what to do until then, ask your doctor or other care provider.   MENS 50+ MULTI VITAMIN/MIN Tabs Take 1 tablet by mouth daily.   metFORMIN 750 MG 24 hr tablet Commonly known as:  GLUCOPHAGE-XR Take  750 mg by mouth 2 (two) times daily.   pioglitazone 45 MG tablet Commonly known as:  ACTOS Take 45 mg by mouth daily.   pravastatin 80 MG tablet Commonly known as:  PRAVACHOL Take 80 mg by mouth at bedtime.   TOUJEO SOLOSTAR [place] Inject 24 Units into the skin daily.   TYLENOL 500 MG tablet Generic drug:  acetaminophen Take 1,000 mg by mouth daily as needed for moderate pain.   vitamin C 1000 MG tablet Take 1,000 mg by mouth daily.        No Known Allergies  Consultations:  Gastroenterology Dr. Carlean Purl   Procedures/Studies:  No results found.   Subjective: No new complaints feels great once go home.  Discharge Exam: Vitals:   12/22/16 2058 12/23/16 0451  BP: (!) 111/57 (!) 104/49  Pulse: 72 75  Resp: 20 18  Temp: 98.4 F (36.9 C) 98.4 F (36.9 C)  SpO2: 97% 97%   Vitals:   12/22/16 1545 12/22/16 1559 12/22/16 2058 12/23/16 0451  BP: (!) 123/52 (!) 105/43 (!) 111/57 (!) 104/49  Pulse: 91 93 72 75  Resp: 12 (!) 22 20 18   Temp:  97.9 F (36.6 C) 98.4 F (36.9 C) 98.4 F (36.9 C)  TempSrc:  Oral Oral Oral  SpO2: 100% 100% 97% 97%  Weight:      Height:        General: in no acute distress Cardiovascular: regular rate and rhythm with positive S1-S2 no murmurs rubs gallops. Respiratory: good air movement and clear to auscultation. Abdominal: soft positive bowel sounds nontender nondistended. Extremities: no edema, no cyanosis    The results of significant diagnostics from this hospitalization (including imaging, microbiology, ancillary and laboratory) are listed below for reference.     Microbiology: No results found for this or any previous visit (from the past 240 hour(s)).   Labs: BNP (last 3 results) No results for input(s): BNP in the last 8760 hours. Basic Metabolic Panel:  Recent Labs Lab 12/20/16 1114 12/21/16 0739  NA 137 135  K 4.5 3.9  CL 103 104  CO2 23 25  GLUCOSE 147* 102*  BUN 25* 19  CREATININE 0.94 0.72  CALCIUM 9.3 8.6*  MG  --  1.7   Liver Function Tests:  Recent Labs Lab 12/20/16 1114  AST 23  ALT 17  ALKPHOS 95  BILITOT 0.7  PROT 6.3*  ALBUMIN 3.5   No results for input(s): LIPASE, AMYLASE in the last 168 hours. No results for input(s): AMMONIA in the last 168 hours. CBC:  Recent Labs Lab 12/20/16 1114  12/21/16 2308 12/22/16 0702 12/22/16 1633 12/22/16 2247 12/23/16 0637  WBC 9.3  --   --  7.8  --   --   --   HGB 12.4*  < > 10.0*  9.8* 9.3* 9.3* 9.9*  HCT 37.3*  --   --  28.3*  --   --   --   MCV 87.6  --   --  86.8  --   --   --   PLT 279  --   --  234  --   --   --   < > = values in this interval not displayed. Cardiac Enzymes: No results for input(s): CKTOTAL, CKMB, CKMBINDEX, TROPONINI in the last 168 hours. BNP: Invalid input(s): POCBNP CBG:  Recent Labs Lab 12/22/16 1524 12/22/16 1606 12/22/16 1719 12/22/16 2100 12/23/16 0743  GLUCAP 116* 99 71 117* 72   D-Dimer No results for input(s): DDIMER  in the last 72 hours. Hgb A1c No results for input(s): HGBA1C in the last 72 hours. Lipid Profile No results for input(s): CHOL, HDL, LDLCALC, TRIG, CHOLHDL, LDLDIRECT in the last 72 hours. Thyroid function studies No results for input(s): TSH, T4TOTAL, T3FREE, THYROIDAB in the last 72 hours.  Invalid input(s): FREET3 Anemia work up  Recent Labs  12/21/16 0916  VITAMINB12 477  FOLATE 31.0  FERRITIN 38  TIBC 294  IRON 53  RETICCTPCT 1.5   Urinalysis    Component Value Date/Time   COLORURINE yellow 05/28/2007 0917   APPEARANCEUR Clear 05/28/2007 0917   LABSPEC 1.020 05/28/2007 Pink 5.0 05/28/2007 0917   HGBUR trace-lysed 05/28/2007 0917   BILIRUBINUR negative 05/28/2007 0917   UROBILINOGEN negative 05/28/2007 0917   NITRITE negative 05/28/2007 0917   Sepsis Labs Invalid input(s): PROCALCITONIN,  WBC,  LACTICIDVEN Microbiology No results found for this or any previous visit (from the past 240 hour(s)).   Time coordinating discharge: Over 30 minutes  SIGNED:   Charlynne Cousins, MD  [facility] 12/23/2016, 8:55 AM Pager   If 7PM-7AM, please contact night-coverage [email or web address] Password [email or web address]

## 2016-12-23 NOTE — Progress Notes (Signed)
    Progress Note   Subjective  Chief Complaint: Rectal bleeding  No further episodes of bleeding per the patient, he is doing well this morning and is ready to go home.  He does describe some trouble with hard stools at home and questions what he can use for this.  Patient also asked questions regarding his diet going forward.  Did discuss this with him.  He denies any new complaints or concerns.    Objective   Vital signs in last 24 hours: Temp:  [97.6 F (36.4 C)-98.7 F (37.1 C)] 98.4 F (36.9 C) (10/26 0451) Pulse Rate:  [72-103] 75 (10/26 0451) Resp:  [11-27] 18 (10/26 0451) BP: (104-155)/(43-68) 104/49 (10/26 0451) SpO2:  [97 %-100 %] 97 % (10/26 0451) Last BM Date: 12/22/16 General:    white male in NAD Heart:  Regular rate and rhythm; no murmurs Lungs: Respirations even and unlabored, lungs CTA bilaterally Abdomen:  Soft, nontender and nondistended. Normal bowel sounds. Extremities:  Without edema. Neurologic:  Alert and oriented,  grossly normal neurologically. Psych:  Cooperative. Normal mood and affect. Lab Results:  Recent Labs  12/20/16 1114  12/22/16 0702 12/22/16 1633 12/22/16 2247 12/23/16 0637  WBC 9.3  --  7.8  --   --   --   HGB 12.4*  < > 9.8* 9.3* 9.3* 9.9*  HCT 37.3*  --  28.3*  --   --   --   PLT 279  --  234  --   --   --   < > = values in this interval not displayed.     Assessment / Plan:   Assessment: 1.  Rectal bleeding: No further episodes overnight, colonoscopy yesterday showing diverticula which is likely the source of recent bleeding, hemoglobin stable today 2. Anemia  Plan: 1.  Discussed with the patient that I would recommend a low fiber/low residue diet for a week and then increase to high-fiber around 25-35 g a day with the use of a fiber supplement such as Metamucil or Benefiber 2.  Did discuss stool softeners such as Colace or Dulcolax with the patient as he was concerned regarding "hard stools" 3.  Patient was told to hold  his aspirin and Meloxicam for 1 week by Dr. Carlean Purl 4.  We will contact patient regarding pathology of recent polyp removed 5. Patient advised to follow with PCP for follow up hgb, anemia, etc in the next 1-2 weeks 6.  Patient will follow in clinic as needed in the future  Discharge Planning Diet: Low fiber/low residue times 1 week, then increase to a high fiber diet 25-35 g/day, with use of a fiber supplement such as Metamucil Anticoagulation and antiplatelets: Hold aspirin x 1 week, Hold Meloxicam x1 week Discharge Medications: No new Follow up: As needed with [place] in our outpatient clinic  Thank you for your kind consultation, we will sign off.   LOS: 2 days   Levin Erp  12/23/2016, 9:49 AM  Pager # [phone]   [facility] Attending   I have taken an interval history, reviewed the chart and examined the patient. I agree with the Advanced Practitioner's note, impression and recommendations.     Gatha Mayer, MD, [facility] [phone] (pager) 12/23/2016 1:08 PM

## 2016-12-26 ENCOUNTER — Encounter: Payer: Self-pay | Admitting: Internal Medicine

## 2016-12-26 ENCOUNTER — Telehealth: Payer: Self-pay | Admitting: *Deleted

## 2016-12-26 DIAGNOSIS — E119 Type 2 diabetes mellitus without complications: Secondary | ICD-10-CM | POA: Diagnosis not present

## 2016-12-26 NOTE — Progress Notes (Signed)
Benign mucosal polyp not a true colon polyp.  Letter by my chart no recall

## 2016-12-26 NOTE — Telephone Encounter (Signed)
Transition Care Management Follow-up Telephone Call   Date discharged? 12/23/16   How have you been since you were released from the hospital? Pt states he seem to be doing well   Do you understand why you were in the hospital? YES   Do you understand the discharge instructions? YES   Where were you discharged to? Home   Items Reviewed:  Medications reviewed: YES  Allergies reviewed: YES  Dietary changes reviewed: YES  Referrals reviewed: No referral needed   Functional Questionnaire:   Activities of Daily Living (ADLs):   He states he are independent in the following: ambulation, bathing and hygiene, feeding, continence, grooming, toileting and dressing States he doesn't require assistance   Any transportation issues/concerns?: NO   Any patient concerns? NO   Confirmed importance and date/time of follow-up visits scheduled YES, 12/29/16  Provider Appointment booked with w/Dr. Quay Burow  Confirmed with patient if condition begins to worsen call PCP or go to the ER.  Patient was given the office number and encouraged to call back with question or concerns.  : YES

## 2016-12-28 DIAGNOSIS — S82822D Torus fracture of lower end of left fibula, subsequent encounter for fracture with routine healing: Secondary | ICD-10-CM | POA: Diagnosis not present

## 2016-12-29 ENCOUNTER — Ambulatory Visit (INDEPENDENT_AMBULATORY_CARE_PROVIDER_SITE_OTHER): Payer: Medicare Other | Admitting: Internal Medicine

## 2016-12-29 ENCOUNTER — Encounter: Payer: Self-pay | Admitting: Internal Medicine

## 2016-12-29 VITALS — BP 112/56 | HR 93 | Temp 98.7°F | Resp 16 | Wt 174.0 lb

## 2016-12-29 DIAGNOSIS — D62 Acute posthemorrhagic anemia: Secondary | ICD-10-CM | POA: Diagnosis not present

## 2016-12-29 DIAGNOSIS — I10 Essential (primary) hypertension: Secondary | ICD-10-CM

## 2016-12-29 DIAGNOSIS — K922 Gastrointestinal hemorrhage, unspecified: Secondary | ICD-10-CM | POA: Diagnosis not present

## 2016-12-29 DIAGNOSIS — E119 Type 2 diabetes mellitus without complications: Secondary | ICD-10-CM

## 2016-12-29 NOTE — Assessment & Plan Note (Signed)
Sees Endocrine next month and will have blood work done Management per endo

## 2016-12-29 NOTE — Patient Instructions (Addendum)
Taking vitamin C and Slow release iron daily for 6 weeks.   Have blood work done with your endocrinologist.   No immunizations administered today.   Medications reviewed and updated.  No changes recommended at this time.    Please followup in 6 months

## 2016-12-29 NOTE — Assessment & Plan Note (Signed)
BP Readings from Last 3 Encounters:  12/29/16 (!) 112/56  12/23/16 (!) 104/49  02/19/16 126/72    BP well controlled Current regimen effective and well tolerated Continue current medications at current doses

## 2016-12-29 NOTE — Assessment & Plan Note (Signed)
Secondary to GI bleed from nsaids meloxicam stopped  Will restart asa after 1 week Will start iron plus vitamin c Recheck cbc in about 6 weeks

## 2016-12-29 NOTE — Progress Notes (Signed)
Subjective:    Patient ID: Scott Herman, male    DOB: 1933-09-06, 83 y.o.   MRN: [id number]  HPI The patient is here for follow up from the hospital.  Admitted 12/20/16 -12/23/16 for blood in the stool.    He went to the ED for evaluation of blood in his stool.  He denied abdominal pain, nausea/vomiting, diarrhea and fever.  In the emergency room he was slightly orthostatic, hemoglobin was 12.4.  He received 1 L of fluids and GI was consulted.  They recommended observation in the hospital.  Hematochezia, lower GI bleed: His baseline hemoglobin is 13 and on admission was 12.4.  He did have several melanotic stools in the hospital and his hemoglobin dropped to 9.9.  GI was consulted and a colonoscopy was done that showed one polyp and multiple diverticuli.  He was instructed to stay off aspirin and NSAIDs for 1 week.  He did stop both.  He denies blood in the stool or black stool.  He denies abdominal pain, nausea or gerd.   Acute blood loss anemia: Significant hemoglobin drop secondary to GI bleed.  Ferritin was 38.  He did receive IV iron.  He needs a follow-up CBC in 6 weeks.  No symptoms of bleeding.  He will start iron pills.    Insulin-dependent diabetes: No changes were made to his insulin regimen.  He sees endo next month.  His sugars are well controlled at home.    Hypertension, orthostatic hypotension: His blood pressure medications were held initially due to orthostatic hypotension.  He did receive fluids.  He was instructed to resume his blood pressure medications upon discharge.  He is taking his medication daily.  His BP is lower today than usual.   His appetite is good.  He is still fatigued, but it is improving.  He denies any fever or chills.    Medications and allergies reviewed with patient and updated if appropriate.  Patient Active Problem List   Diagnosis Date Noted  . Benign neoplasm of sigmoid colon   . Lower GI bleed 12/20/2016  . Acute blood loss anemia   .  BPH (benign prostatic hyperplasia) 01/12/2015  . HTN (hypertension) 06/26/2014  . Abnormal stress test 04/19/2011  . GERD 08/27/2009  . DIVERTICULOSIS-COLON 08/27/2009  . Diabetes ([place]) 05/28/2007  . MICROSCOPIC HEMATURIA 05/28/2007  . NOCTURIA 05/28/2007  . PALPITATIONS 02/09/2007    Current Outpatient Prescriptions on File Prior to Visit  Medication Sig Dispense Refill  . acetaminophen (TYLENOL) 500 MG tablet Take 1,000 mg by mouth daily as needed for moderate pain.    . Ascorbic Acid (VITAMIN C) 1000 MG tablet Take 1,000 mg by mouth daily.    [name] Kitchen aspirin 81 MG tablet Take 1 tablet (81 mg total) by mouth at bedtime. 30 tablet   . CINNAMON PO Take 2,000 mg by mouth at bedtime.    [name] co-enzyme Q-10 30 MG capsule Take 30 mg by mouth daily.      [name] esomeprazole (NEXIUM) 40 MG capsule Take 1 capsule (40 mg total) by mouth daily before breakfast. (Patient taking differently: Take 20 mg by mouth daily before breakfast. ) 90 capsule 1  . Insulin Glargine (TOUJEO SOLOSTAR [place]) Inject 24 Units into the skin daily.     . metFORMIN (GLUCOPHAGE-XR) 750 MG 24 hr tablet Take 750 mg by mouth 2 (two) times daily.      . Multiple Vitamins-Minerals (MENS 50+ MULTI VITAMIN/MIN) TABS Take 1 tablet by mouth daily.    [name] Kitchen  pioglitazone (ACTOS) 45 MG tablet Take 45 mg by mouth daily.    . pravastatin (PRAVACHOL) 80 MG tablet Take 80 mg by mouth at bedtime.     . Tamsulosin HCl (FLOMAX) 0.4 MG CAPS Take 0.4 mg by mouth daily.       No current facility-administered medications on file prior to visit.     Past Medical History:  Diagnosis Date  . Abnormal stress electrocardiogram test Jan 2013   Negative cardiac catheterization.   . Diabetes mellitus, type 2 (HCC)    Dr Altheimer  . Diverticulosis   . GERD (gastroesophageal reflux disease)   . Hiatal hernia   . Hyperlipidemia   . Normal cardiac stress test Jan 2009   Normal ETT  . Palpitations    Negative holter in 2009 except for tachycardia with bicycling      Past Surgical History:  Procedure Laterality Date  . CARDIAC CATHETERIZATION  Feb 2013   Mild nonobstructive CAD. Normal LV function  . CHOLECYSTECTOMY    . COLONOSCOPY     Dr Fuller Plan  . COLONOSCOPY WITH PROPOFOL N/A 12/22/2016   Procedure: COLONOSCOPY WITH PROPOFOL;  Surgeon: Gatha Mayer, MD;  Location: [facility];  Service: Endoscopy;  Laterality: N/A;  . HEMORRHOID SURGERY    . INGUINAL HERNIA REPAIR    . ORIF CLAVICULAR FRACTURE     left  . TONSILLECTOMY      Social History   Social History  . Marital status: Married    Spouse name: N/A  . Number of children: N/A  . Years of education: N/A   Occupational History  . Retired    Social History Main Topics  . Smoking status: Never Smoker  . Smokeless tobacco: Never Used  . Alcohol use No  . Drug use: No  . Sexual activity: Yes   Other Topics Concern  . None   Social History Narrative  . None    Family History  Problem Relation Age of Onset  . Prostate cancer Father   . Diabetes Father   . Diverticulosis Father   . Stroke Mother        > 34  . Hypertension Brother   . Diabetes Brother   . Prostate cancer Paternal Grandfather   . Diabetes Paternal Grandmother   . Colon cancer Neg Hx     Review of Systems  Constitutional: Positive for fatigue. Negative for appetite change, chills and fever.  Respiratory: Negative for shortness of breath.   Cardiovascular: Negative for chest pain, palpitations and leg swelling.  Gastrointestinal: Negative for abdominal pain, blood in stool (no black stool), constipation, diarrhea and nausea.       No gerd  Neurological: Positive for light-headedness (occ with standing). Negative for headaches.       Objective:   Vitals:   12/29/16 1032  BP: (!) 112/56  Pulse: 93  Resp: 16  Temp: 98.7 F (37.1 C)  SpO2: 97%   Wt Readings from Last 3 Encounters:  12/29/16 174 lb (78.9 kg)  12/20/16 170 lb 3.2 oz (77.2 kg)  02/19/16 170 lb (77.1 kg)   Body mass index  is 24.27 kg/m.   Physical Exam    Constitutional: Appears well-developed and well-nourished. No distress.  HENT:  Head: Normocephalic and atraumatic.  Neck: Neck supple. No tracheal deviation present. No thyromegaly present.  No cervical lymphadenopathy Cardiovascular: Normal rate, regular rhythm and normal heart sounds.   No murmur heard. No carotid bruit .  No edema Pulmonary/Chest: Effort normal  and breath sounds normal. No respiratory distress. No has no wheezes. No rales.  Abdomen: soft, non tender, non distended Skin: Skin is warm and dry. Not diaphoretic.  Psychiatric: Normal mood and affect. Behavior is normal.    12/22/16: Colonoscopy:   Benign neoplasm of sigmoid colon, hemorrhoids,  Diverticulosis of large intestine without perforation or abscess without bleeding   GI recommendation: - Return patient to hospital ward for observation. - Diabetic (ADA) diet. - Continue present medications. - No repeat colonoscopy due to age. - Home tomorrow unless more bleeding. Would avoid meloxicam and ASA x 1 week' F/U GI prn See pcp and f/u Hgb, anemia, etc was given feraheme here  Assessment & Plan:    See Problem List for Assessment and Plan of chronic medical problems.

## 2016-12-29 NOTE — Assessment & Plan Note (Signed)
Related to nsaid ( meloxicam) and ASA hgb dropped, received iron, but no transfusion Colonoscopy showed polyp and diverticulosis Will start iron plus vitamin c x 6 weeks Recheck cbc in 6 weeks Monitor stool for black, red blood Can restart ASA after one week Will not take any other nsaids Tylenol prn for pain Fu GI prn

## 2017-01-03 DIAGNOSIS — K922 Gastrointestinal hemorrhage, unspecified: Secondary | ICD-10-CM

## 2017-01-05 ENCOUNTER — Encounter: Payer: Self-pay | Admitting: Internal Medicine

## 2017-01-05 ENCOUNTER — Other Ambulatory Visit (INDEPENDENT_AMBULATORY_CARE_PROVIDER_SITE_OTHER): Payer: Medicare Other

## 2017-01-05 ENCOUNTER — Ambulatory Visit: Payer: Medicare Other | Admitting: Internal Medicine

## 2017-01-05 VITALS — HR 96 | Temp 97.7°F | Ht 71.0 in | Wt 170.0 lb

## 2017-01-05 DIAGNOSIS — R531 Weakness: Secondary | ICD-10-CM

## 2017-01-05 DIAGNOSIS — I10 Essential (primary) hypertension: Secondary | ICD-10-CM

## 2017-01-05 DIAGNOSIS — E119 Type 2 diabetes mellitus without complications: Secondary | ICD-10-CM | POA: Diagnosis not present

## 2017-01-05 LAB — HEPATIC FUNCTION PANEL
ALK PHOS: 91 U/L (ref 39–117)
ALT: 14 U/L (ref 0–53)
AST: 17 U/L (ref 0–37)
Albumin: 3.8 g/dL (ref 3.5–5.2)
BILIRUBIN DIRECT: 0.2 mg/dL (ref 0.0–0.3)
TOTAL PROTEIN: 6.6 g/dL (ref 6.0–8.3)
Total Bilirubin: 0.6 mg/dL (ref 0.2–1.2)

## 2017-01-05 LAB — CBC WITH DIFFERENTIAL/PLATELET
BASOS ABS: 0.1 10*3/uL (ref 0.0–0.1)
BASOS PCT: 0.7 % (ref 0.0–3.0)
EOS ABS: 0.1 10*3/uL (ref 0.0–0.7)
Eosinophils Relative: 1.4 % (ref 0.0–5.0)
HEMATOCRIT: 37.5 % — AB (ref 39.0–52.0)
HEMOGLOBIN: 12.4 g/dL — AB (ref 13.0–17.0)
LYMPHS PCT: 12.9 % (ref 12.0–46.0)
Lymphs Abs: 1.1 10*3/uL (ref 0.7–4.0)
MCHC: 33.2 g/dL (ref 30.0–36.0)
MCV: 89.2 fl (ref 78.0–100.0)
Monocytes Absolute: 0.5 10*3/uL (ref 0.1–1.0)
Monocytes Relative: 6.1 % (ref 3.0–12.0)
Neutro Abs: 6.7 10*3/uL (ref 1.4–7.7)
Neutrophils Relative %: 78.9 % — ABNORMAL HIGH (ref 43.0–77.0)
Platelets: 337 10*3/uL (ref 150.0–400.0)
RBC: 4.21 Mil/uL — AB (ref 4.22–5.81)
RDW: 14.5 % (ref 11.5–15.5)
WBC: 8.5 10*3/uL (ref 4.0–10.5)

## 2017-01-05 LAB — BASIC METABOLIC PANEL
BUN: 13 mg/dL (ref 6–23)
CHLORIDE: 99 meq/L (ref 96–112)
CO2: 30 meq/L (ref 19–32)
Calcium: 9.7 mg/dL (ref 8.4–10.5)
Creatinine, Ser: 0.8 mg/dL (ref 0.40–1.50)
GFR: 98.44 mL/min (ref >60.00)
Glucose, Bld: 196 mg/dL — ABNORMAL HIGH (ref 70–99)
POTASSIUM: 4.1 meq/L (ref 3.5–5.1)
SODIUM: 135 meq/L (ref 135–145)

## 2017-01-05 LAB — TSH: TSH: 1.54 u[IU]/mL (ref 0.35–4.50)

## 2017-01-05 NOTE — Progress Notes (Signed)
Subjective:    Patient ID: Scott Herman, male    DOB: Jul 06, 1933, 83 y.o.   MRN: [id number]  HPI   Here to f/u with somewhat vague c/o gradually worsening dizziness and weakness x 3-4 days, but may have started even a week ago.  Seemed the worst this AM when he had catch himself from falling in the kitchen by grabbing on to a counter.  Denies HA, focal neuro symptoms, and no fever,ST, coughing, dysuria or other urinary symptoms. Pt denies chest pain, increased sob or doe, wheezing, orthopnea, PND, increased LE swelling, palpitations, syncope.   Restarted asa x 2 days ago, no overt bleeding. No mobic for x 2 -3 wks.  Feels a brief nausea after eating usually resolved in seconds with belching only.  Denies worsening reflux, abd pain, dysphagia, n/v, bowel change or blood, has occasional loose stools but no different than usual.  No prior hx of dizziness with flomax.    Has no low sugars but cbgs in the past wk running often over his usual 150 or less - more often in the 150-200.  Denies worsening depressive symptoms, suicidal ideation, or panic BP Readings from Last 3 Encounters:  12/29/16 (!) 112/56  12/23/16 (!) 104/49  02/19/16 126/72   Wt Readings from Last 3 Encounters:  01/05/17 170 lb (77.1 kg)  12/29/16 174 lb (78.9 kg)  12/20/16 170 lb 3.2 oz (77.2 kg)   Past Medical History:  Diagnosis Date  . Abnormal stress electrocardiogram test Jan 2013   Negative cardiac catheterization.   . Diabetes mellitus, type 2 (HCC)    Dr Altheimer  . Diverticulosis   . GERD (gastroesophageal reflux disease)   . Hiatal hernia   . Hyperlipidemia   . Normal cardiac stress test Jan 2009   Normal ETT  . Palpitations    Negative holter in 2009 except for tachycardia with bicycling   Past Surgical History:  Procedure Laterality Date  . CARDIAC CATHETERIZATION  Feb 2013   Mild nonobstructive CAD. Normal LV function  . CHOLECYSTECTOMY    . COLONOSCOPY     Dr Fuller Plan  . HEMORRHOID SURGERY    .  INGUINAL HERNIA REPAIR    . ORIF CLAVICULAR FRACTURE     left  . TONSILLECTOMY      reports that  has never smoked. he has never used smokeless tobacco. He reports that he does not drink alcohol or use drugs. family history includes Diabetes in his brother, father, and paternal grandmother; Diverticulosis in his father; Hypertension in his brother; Prostate cancer in his father and paternal grandfather; Stroke in his mother. No Known Allergies Current Outpatient Medications on File Prior to Visit  Medication Sig Dispense Refill  . acetaminophen (TYLENOL) 500 MG tablet Take 1,000 mg by mouth daily as needed for moderate pain.    . Ascorbic Acid (VITAMIN C) 1000 MG tablet Take 1,000 mg by mouth daily.    [name] Kitchen aspirin 81 MG tablet Take 1 tablet (81 mg total) by mouth at bedtime. 30 tablet   . CINNAMON PO Take 2,000 mg by mouth at bedtime.    [name] Kitchen co-enzyme Q-10 30 MG capsule Take 30 mg by mouth daily.      [name] Kitchen esomeprazole (NEXIUM) 40 MG capsule Take 1 capsule (40 mg total) by mouth daily before breakfast. (Patient taking differently: Take 20 mg by mouth daily before breakfast. ) 90 capsule 1  . Insulin Glargine (TOUJEO SOLOSTAR [place]) Inject 24 Units into the skin daily.     [name] Kitchen  metFORMIN (GLUCOPHAGE-XR) 750 MG 24 hr tablet Take 750 mg by mouth 2 (two) times daily.      . Multiple Vitamins-Minerals (MENS 50+ MULTI VITAMIN/MIN) TABS Take 1 tablet by mouth daily.    . pioglitazone (ACTOS) 45 MG tablet Take 45 mg by mouth daily.    . pravastatin (PRAVACHOL) 80 MG tablet Take 80 mg by mouth at bedtime.     . Tamsulosin HCl (FLOMAX) 0.4 MG CAPS Take 0.4 mg by mouth daily.       No current facility-administered medications on file prior to visit.    Review of Systems  Constitutional: Negative for other unusual diaphoresis or sweats HENT: Negative for ear discharge or swelling Eyes: Negative for other worsening visual disturbances Respiratory: Negative for stridor or other swelling  Gastrointestinal:  Negative for worsening distension or other blood Genitourinary: Negative for retention or other urinary change Musculoskeletal: Negative for other MSK pain or swelling Skin: Negative for color change or other new lesions Neurological: Negative for worsening tremors and other numbness  Psychiatric/Behavioral: Negative for worsening agitation or other fatigue All other system neg per pt    Objective:   Physical Exam Pulse 96   Temp 97.7 F (36.5 C) (Oral)   Ht 5\' 11"  (1.803 m)   Wt 170 lb (77.1 kg)   SpO2 99%   BMI 23.71 kg/m   Orthostatics - lying 124.72, sitting 118.68, standing 118/70 VS noted, + mild generalized weakness and needs minor assist with shakingly getting up on exam table Constitutional: Pt appears in NAD HENT: Head: NCAT.  Right Ear: External ear normal.  Left Ear: External ear normal.  Eyes: . Pupils are equal, round, and reactive to light. Conjunctivae and EOM are normal Nose: without d/c or deformity Neck: Neck supple. Gross normal ROM Cardiovascular: Normal rate and regular rhythm.   Pulmonary/Chest: Effort normal and breath sounds without rales or wheezing.  Abd:  Soft, NT, ND, + BS, no organomegaly Neurological: Pt is alert. At baseline orientation, motor grossly intact Skin: Skin is warm. No rashes, other new lesions, no LE edema Psychiatric: Pt behavior is normal without agitation  No other exam findings  Lab Results  Component Value Date   WBC 7.8 12/22/2016   HGB 9.9 (L) 12/23/2016   HCT 28.3 (L) 12/22/2016   PLT 234 12/22/2016   GLUCOSE 102 (H) 12/21/2016   CHOL 116 10/17/2016   TRIG 84 10/17/2016   HDL 50 10/17/2016   LDLCALC 55 10/17/2016   ALT 17 12/20/2016   AST 23 12/20/2016   NA 135 12/21/2016   K 3.9 12/21/2016   CL 104 12/21/2016   CREATININE 0.72 12/21/2016   BUN 19 12/21/2016   CO2 25 12/21/2016   TSH 1.50 02/09/2007   INR 1.02 12/20/2016   HGBA1C 6.2 10/17/2016       Assessment & Plan:

## 2017-01-05 NOTE — Patient Instructions (Signed)
Please drink more fluids in the next 2-3 days, and avoid heights such as ladders  Please continue all other medications as before, and refills have been done if requested.  Please have the pharmacy call with any other refills you may need.  Please continue your efforts at being more active, low cholesterol diet, and weight control  Please keep your appointments with your specialists as you may have planned  Please go to the LAB in the Basement (turn left off the elevator) for the tests to be done today  You will be contacted by phone if any changes need to be made immediately.  Otherwise, you will receive a letter about your results with an explanation, but please check with MyChart first.  Please remember to sign up for MyChart if you have not done so, as this will be important to you in the future with finding out test results, communicating by private email, and scheduling acute appointments online when needed.

## 2017-01-06 ENCOUNTER — Encounter: Payer: Self-pay | Admitting: Family Medicine

## 2017-01-06 ENCOUNTER — Telehealth: Payer: Self-pay | Admitting: Internal Medicine

## 2017-01-06 ENCOUNTER — Ambulatory Visit: Payer: Medicare Other | Admitting: Family Medicine

## 2017-01-06 ENCOUNTER — Other Ambulatory Visit (INDEPENDENT_AMBULATORY_CARE_PROVIDER_SITE_OTHER): Payer: Medicare Other

## 2017-01-06 VITALS — BP 136/72 | HR 81 | Ht 71.0 in | Wt 176.0 lb

## 2017-01-06 DIAGNOSIS — R42 Dizziness and giddiness: Secondary | ICD-10-CM

## 2017-01-06 DIAGNOSIS — E119 Type 2 diabetes mellitus without complications: Secondary | ICD-10-CM | POA: Diagnosis not present

## 2017-01-06 DIAGNOSIS — R531 Weakness: Secondary | ICD-10-CM | POA: Diagnosis not present

## 2017-01-06 LAB — HEMOGLOBIN A1C: HEMOGLOBIN A1C: 5.9 % (ref 4.6–6.5)

## 2017-01-06 LAB — GLUCOSE, POCT (MANUAL RESULT ENTRY): POC Glucose: 166 mg/dl — AB (ref 70–99)

## 2017-01-06 MED ORDER — MECLIZINE HCL 25 MG PO TABS: 25.0000 mg | ORAL_TABLET | Freq: Three times a day (TID) | ORAL | 0 refills | Status: DC | PRN

## 2017-01-06 NOTE — Telephone Encounter (Signed)
Ok with me 

## 2017-01-06 NOTE — Assessment & Plan Note (Addendum)
With mild worsening cbg's in recent days for unclear reasons, no overt infectious source or other definite acute illness, doubt med related, will overall continue to monitor, doubt sugars high enough to result in significant low volume and tendency to orthostasis but cannot be completely ruled out; I encouracged increased po fluids over the next few days

## 2017-01-06 NOTE — Assessment & Plan Note (Signed)
Etiology unclear, assoc with some dizziness and shakiness on standing with cane, but no falls, fever and hx/exam o/w benign.  Doubt med related. Orthostatics not clearly significant.  No definite reduced po intake.  Will check labs as ordered, consider MRI head, cxr or neurology referral if persists or worsens. Pt declines PT referral.

## 2017-01-06 NOTE — Assessment & Plan Note (Signed)
stable overall by history and exam, and pt to continue medical treatment as before,  to f/u any worsening symptoms or concerns 

## 2017-01-06 NOTE — Progress Notes (Signed)
   Subjective:  Scott Herman is a 83 y.o. male who presents today with a chief complaint of dizziness.   HPI:  Dizziness, acute issue Patient symptoms started a couple weeks ago.  Reports that he went to the hospital with a GI bleed.  While there underwent GI eval without a clear source for his GI bleed.  Was told that it may be diverticular bleed.  After going home he had a couple feelings of weakness and uneasiness.  He also felt more unstable.  Over the last couple of days of symptoms have worsened.  He was evaluated yesterday and noted to have low blood pressure.  Also had blood work done at that time which was normal.  Denies any further episodes of bleeding.  He did have one episode where he fell while trying to sit down earlier today.  He has had a couple episodes of room spinning sensation over the last few days.  Symptoms will last only for a few minutes and then subside.  No weakness or numbness.  No syncope.  No presyncope.  Symptoms seem to occur while he is moving his head.  No nausea or vomiting.  He is not currently having any symptoms.  Patient reports that symptoms feel like his prior episodes of vertigo.  ROS: Per HPI  Objective:  Physical Exam: BP 136/72 (BP Location: Left Arm, Patient Position: Sitting, Cuff Size: Normal)   Pulse 81   Ht 5\' 11"  (1.803 m)   Wt 176 lb (79.8 kg)   SpO2 97%   BMI 24.55 kg/m   Gen: NAD, resting comfortably HEENT: TMs clear bilaterally. CV: RRR with no murmurs appreciated Pulm: NWOB, CTAB with no crackles, wheezes, or rhonchi GI: Normal bowel sounds present. Soft, Nontender, Nondistended. MSK: No edema, cyanosis, or clubbing noted Skin: Warm, dry Neuro: Cranial nerves II through XII intact.  Strength 5 out of 5 in upper and lower extremities.  Finger-nose-finger testing intact bilaterally.  Dix-Hallpike deferred. Psych: Normal affect and thought content  Results for orders placed or performed in visit on 01/06/17 (from the past 72  hour(s))  POCT glucose (manual entry)     Status: Abnormal   Collection Time: 01/06/17  3:43 PM  Result Value Ref Range   POC Glucose 166 (A) 70 - 99 mg/dl   Assessment/Plan:  Vertigo Given that symptoms occur with movement of his head and last only momentarily, symptoms likely due to BPPV.  His neurological exam today is normal-doubt intracranial process.  His lab work from yesterday was largely normal including improving hemoglobin and normal glucose.  His CBG today is normal.  His blood pressure today is within normal limits.  Start meclizine.  Discussed side effects of this medication including constipation, dry mouth, and urinary retention.  Offered referral to vestibular rehab, however patient deferred.  Return precautions reviewed.  [name]. [name], MD 01/06/2017 5:12 PM

## 2017-01-06 NOTE — Telephone Encounter (Signed)
Patient would like to transfer care to Dr. [name] due to location. Out of courtesy for both providers I will await approval before making the transfer official. Please respond with an approval or denial of transfer.

## 2017-01-06 NOTE — Telephone Encounter (Signed)
Okay with me.  Scott Herman. [name], MD 01/06/2017 5:19 PM

## 2017-02-09 DIAGNOSIS — Z794 Long term (current) use of insulin: Secondary | ICD-10-CM | POA: Diagnosis not present

## 2017-02-09 DIAGNOSIS — E1165 Type 2 diabetes mellitus with hyperglycemia: Secondary | ICD-10-CM | POA: Diagnosis not present

## 2017-02-09 DIAGNOSIS — E782 Mixed hyperlipidemia: Secondary | ICD-10-CM | POA: Diagnosis not present

## 2017-02-09 LAB — BASIC METABOLIC PANEL
BUN: 14 (ref 4–21)
Creatinine: 0.8 (ref 0.6–1.3)
Glucose: 97
POTASSIUM: 4.1 (ref 3.4–5.3)
SODIUM: 138 (ref 137–147)

## 2017-02-09 LAB — HEMOGLOBIN A1C: Hemoglobin A1C: 6

## 2017-02-09 LAB — LIPID PANEL
CHOLESTEROL: 111 (ref 0–200)
HDL: 50 (ref 35–70)
LDL Cholesterol: 50
TRIGLYCERIDES: 73 (ref 40–160)

## 2017-02-09 LAB — HEPATIC FUNCTION PANEL
ALT: 15 (ref 10–40)
AST: 19 (ref 14–40)
Alkaline Phosphatase: 65 (ref 25–125)
Bilirubin, Total: 0.6

## 2017-02-13 DIAGNOSIS — E1165 Type 2 diabetes mellitus with hyperglycemia: Secondary | ICD-10-CM | POA: Diagnosis not present

## 2017-02-13 DIAGNOSIS — Z794 Long term (current) use of insulin: Secondary | ICD-10-CM | POA: Diagnosis not present

## 2017-02-13 DIAGNOSIS — E782 Mixed hyperlipidemia: Secondary | ICD-10-CM | POA: Diagnosis not present

## 2017-02-13 DIAGNOSIS — Z125 Encounter for screening for malignant neoplasm of prostate: Secondary | ICD-10-CM | POA: Diagnosis not present

## 2017-02-22 ENCOUNTER — Ambulatory Visit: Payer: Medicare Other

## 2017-02-23 ENCOUNTER — Encounter: Payer: Medicare Other | Admitting: Internal Medicine

## 2017-02-23 ENCOUNTER — Encounter: Payer: Self-pay | Admitting: Physical Therapy

## 2017-02-27 DIAGNOSIS — N476 Balanoposthitis: Secondary | ICD-10-CM | POA: Diagnosis not present

## 2017-02-27 DIAGNOSIS — R350 Frequency of micturition: Secondary | ICD-10-CM | POA: Diagnosis not present

## 2017-02-27 DIAGNOSIS — N401 Enlarged prostate with lower urinary tract symptoms: Secondary | ICD-10-CM | POA: Diagnosis not present

## 2017-03-09 DIAGNOSIS — L821 Other seborrheic keratosis: Secondary | ICD-10-CM | POA: Diagnosis not present

## 2017-03-09 DIAGNOSIS — Z85828 Personal history of other malignant neoplasm of skin: Secondary | ICD-10-CM | POA: Diagnosis not present

## 2017-03-14 DIAGNOSIS — E119 Type 2 diabetes mellitus without complications: Secondary | ICD-10-CM | POA: Diagnosis not present

## 2017-03-28 ENCOUNTER — Encounter: Payer: Self-pay | Admitting: Radiology

## 2017-04-10 DIAGNOSIS — N401 Enlarged prostate with lower urinary tract symptoms: Secondary | ICD-10-CM | POA: Diagnosis not present

## 2017-04-10 DIAGNOSIS — R350 Frequency of micturition: Secondary | ICD-10-CM | POA: Diagnosis not present

## 2017-04-10 DIAGNOSIS — N476 Balanoposthitis: Secondary | ICD-10-CM | POA: Diagnosis not present

## 2017-04-20 ENCOUNTER — Ambulatory Visit: Payer: Medicare Other | Admitting: Family Medicine

## 2017-04-20 ENCOUNTER — Encounter: Payer: Self-pay | Admitting: Family Medicine

## 2017-04-20 VITALS — BP 128/72 | HR 106 | Temp 98.3°F | Ht 71.0 in | Wt 176.6 lb

## 2017-04-20 DIAGNOSIS — N481 Balanitis: Secondary | ICD-10-CM

## 2017-04-20 DIAGNOSIS — R05 Cough: Secondary | ICD-10-CM

## 2017-04-20 DIAGNOSIS — R059 Cough, unspecified: Secondary | ICD-10-CM

## 2017-04-20 MED ORDER — AZITHROMYCIN 250 MG PO TABS: ORAL_TABLET | ORAL | 0 refills | Status: DC

## 2017-04-20 MED ORDER — FLUCONAZOLE 150 MG PO TABS: 150.0000 mg | ORAL_TABLET | Freq: Once | ORAL | 0 refills | Status: AC

## 2017-04-20 NOTE — Patient Instructions (Signed)
I think you have pain upper respiratory infection.  Please wait a few days and start the Z-Pak if your symptoms are worsening or not improving.  Please take a one-time dose of Diflucan.  Take care, Dr [name]

## 2017-04-20 NOTE — Progress Notes (Signed)
    Subjective:  Scott Herman is a 84 y.o. male who presents today for same-day appointment with a chief complaint of cough.   HPI:  Cough, Acute Issue Started 2-3 days.  Associated with rhinorrhea and nasal congestion.  He has tried taking over-the-counter vitamin C which he thinks is helped a little bit.  Symptoms have improved over the last 1-2 days.  No fever or chills.  Symptoms are worse at night.  Wife has had similar symptoms.  No obvious alleviating or aggravating factors.  Penile irritation, new issue Patient with several year history of penile irritation.  He sees urology for this.  He has been prescribed cream in the past including triamcinolone and nystatin.  Symptoms have worsened a little bit over the last few days.  No dysuria.  No penile discharge.  A little bit of redness to the area.  He has not tried any treatments aside from as prescribed by his urologist.  ROS: Per HPI  PMH: He reports that  has never smoked. he has never used smokeless tobacco. He reports that he does not drink alcohol or use drugs.  Objective:  Physical Exam: BP 128/72 (BP Location: Left Arm, Patient Position: Sitting, Cuff Size: Normal)   Pulse (!) 106   Temp 98.3 F (36.8 C) (Oral)   Ht 5\' 11"  (1.803 m)   Wt 176 lb 9.6 oz (80.1 kg)   SpO2 94%   BMI 24.63 kg/m   Gen: NAD, resting comfortably HEENT: TMs clear bilaterally.  Oropharynx slightly erythematous. CV: RRR with no murmurs appreciated Pulm: NWOB, CTAB with no crackles, wheezes, or rhonchi GU: Normal external male genitalia.  Small foreskin adhesion noted on ventral aspect of penis just lateral to urethral meatus.  Small amount of surrounding erythema.  Assessment/Plan:  Cough Likely secondary to viral URI. No signs of bacterial infection.  Sent in a "pocket prescription" for azithromycin with strict instruction to not start unless symptoms worsen or fail to improve within the next several days. Recommended tylenol as needed for  low grade fever and pain. Encouraged good oral hydration. Return precautions reviewed. Follow up as needed.   Balanitis Likely the source of patient's penile irritation.  He also has a small skin adhesion which could be contributing.  Recommended he continue both the triamcinolone and nystatin creams as prescribed by his urologist.  We will give him a one-time dose of Diflucan as well.  [name]. [name], MD 04/20/2017 3:17 PM

## 2017-04-27 ENCOUNTER — Encounter: Payer: Self-pay | Admitting: Family Medicine

## 2017-05-10 DIAGNOSIS — Z794 Long term (current) use of insulin: Secondary | ICD-10-CM | POA: Diagnosis not present

## 2017-05-10 DIAGNOSIS — E782 Mixed hyperlipidemia: Secondary | ICD-10-CM | POA: Diagnosis not present

## 2017-05-10 DIAGNOSIS — E1165 Type 2 diabetes mellitus with hyperglycemia: Secondary | ICD-10-CM | POA: Diagnosis not present

## 2017-05-10 LAB — BASIC METABOLIC PANEL
BUN: 14 (ref 4–21)
Creatinine: 0.8 (ref 0.6–1.3)
Glucose: 86
Potassium: 4.1 (ref 3.4–5.3)
Sodium: 140 (ref 137–147)

## 2017-05-10 LAB — HEPATIC FUNCTION PANEL
ALK PHOS: 62 (ref 25–125)
ALT: 16 (ref 10–40)
AST: 20 (ref 14–40)
Bilirubin, Total: 0.9

## 2017-05-10 LAB — LIPID PANEL
CHOLESTEROL: 116 (ref 0–200)
HDL: 49 (ref 35–70)
LDL Cholesterol: 58
TRIGLYCERIDES: 79 (ref 40–160)

## 2017-05-10 LAB — HEMOGLOBIN A1C: HEMOGLOBIN A1C: 7.2

## 2017-05-16 DIAGNOSIS — R600 Localized edema: Secondary | ICD-10-CM | POA: Diagnosis not present

## 2017-05-16 DIAGNOSIS — E1165 Type 2 diabetes mellitus with hyperglycemia: Secondary | ICD-10-CM | POA: Diagnosis not present

## 2017-05-16 DIAGNOSIS — R21 Rash and other nonspecific skin eruption: Secondary | ICD-10-CM | POA: Diagnosis not present

## 2017-05-16 DIAGNOSIS — E782 Mixed hyperlipidemia: Secondary | ICD-10-CM | POA: Diagnosis not present

## 2017-05-16 LAB — HM DIABETES FOOT EXAM: HM Diabetic Foot Exam: NORMAL

## 2017-05-22 DIAGNOSIS — E1165 Type 2 diabetes mellitus with hyperglycemia: Secondary | ICD-10-CM | POA: Diagnosis not present

## 2017-05-23 DIAGNOSIS — M25579 Pain in unspecified ankle and joints of unspecified foot: Secondary | ICD-10-CM | POA: Insufficient documentation

## 2017-05-23 DIAGNOSIS — M25572 Pain in left ankle and joints of left foot: Secondary | ICD-10-CM | POA: Diagnosis not present

## 2017-05-30 DIAGNOSIS — N481 Balanitis: Secondary | ICD-10-CM | POA: Diagnosis not present

## 2017-05-30 DIAGNOSIS — E1165 Type 2 diabetes mellitus with hyperglycemia: Secondary | ICD-10-CM | POA: Diagnosis not present

## 2017-05-30 DIAGNOSIS — Z794 Long term (current) use of insulin: Secondary | ICD-10-CM | POA: Diagnosis not present

## 2017-05-30 DIAGNOSIS — B3742 Candidal balanitis: Secondary | ICD-10-CM | POA: Diagnosis not present

## 2017-06-02 ENCOUNTER — Encounter: Payer: Self-pay | Admitting: Physical Therapy

## 2017-07-06 DIAGNOSIS — N401 Enlarged prostate with lower urinary tract symptoms: Secondary | ICD-10-CM | POA: Diagnosis not present

## 2017-07-06 DIAGNOSIS — R351 Nocturia: Secondary | ICD-10-CM | POA: Diagnosis not present

## 2017-07-06 DIAGNOSIS — R21 Rash and other nonspecific skin eruption: Secondary | ICD-10-CM | POA: Diagnosis not present

## 2017-07-13 DIAGNOSIS — E1165 Type 2 diabetes mellitus with hyperglycemia: Secondary | ICD-10-CM | POA: Diagnosis not present

## 2017-08-08 ENCOUNTER — Ambulatory Visit (INDEPENDENT_AMBULATORY_CARE_PROVIDER_SITE_OTHER): Payer: Medicare Other | Admitting: Ophthalmology

## 2017-08-08 LAB — HEMOGLOBIN A1C: HEMOGLOBIN A1C: 6.7

## 2017-08-08 LAB — LIPID PANEL
Cholesterol: 119 (ref 0–200)
HDL: 55 (ref 35–70)
LDL Cholesterol: 59
Triglycerides: 76 (ref 40–160)

## 2017-08-08 LAB — HEPATIC FUNCTION PANEL
ALT: 13 (ref 10–40)
AST: 15 (ref 14–40)
Alkaline Phosphatase: 61 (ref 25–125)
BILIRUBIN, TOTAL: 0.7

## 2017-08-08 LAB — BASIC METABOLIC PANEL
BUN: 15 (ref 4–21)
GLUCOSE: 93
POTASSIUM: 3.9 (ref 3.4–5.3)
SODIUM: 139 (ref 137–147)

## 2017-08-10 ENCOUNTER — Telehealth: Payer: Self-pay | Admitting: Cardiology

## 2017-08-10 NOTE — Telephone Encounter (Signed)
Spoke with pt who states he took his BP yesterday and it was 163/87. He took it again this morning at 430am 156/85, 6 am 166/88, 715am 155/78. He denies a headache, dizziness, or blurred vision. He reports he had bottle of Lisinopril 2.5 mg that he was to use PRN that was filled in 2016. He states that in April and may his BP was in the 120's. Routing to Dr. Percival Spanish for recommendation.

## 2017-08-10 NOTE — Telephone Encounter (Signed)
New message   Pt c/o BP issue: STAT if pt c/o blurred vision, one-sided weakness or slurred speech  1. What are your last 5 BP readings? 156/85, 166/88, 155/78  2. Are you having any other symptoms (ex. Dizziness, headache, blurred vision, passed out)? NO  3. What is your BP issue? Patient feels BP too high

## 2017-08-11 DIAGNOSIS — Z794 Long term (current) use of insulin: Secondary | ICD-10-CM | POA: Diagnosis not present

## 2017-08-11 DIAGNOSIS — E1165 Type 2 diabetes mellitus with hyperglycemia: Secondary | ICD-10-CM | POA: Diagnosis not present

## 2017-08-11 DIAGNOSIS — E782 Mixed hyperlipidemia: Secondary | ICD-10-CM | POA: Diagnosis not present

## 2017-08-11 NOTE — Telephone Encounter (Signed)
I have not seen him in over a year.  Schedule follow up.  This can be in July.

## 2017-08-12 DIAGNOSIS — I10 Essential (primary) hypertension: Secondary | ICD-10-CM | POA: Diagnosis not present

## 2017-08-12 DIAGNOSIS — E119 Type 2 diabetes mellitus without complications: Secondary | ICD-10-CM | POA: Diagnosis not present

## 2017-08-14 ENCOUNTER — Encounter: Payer: Self-pay | Admitting: Family Medicine

## 2017-08-14 ENCOUNTER — Ambulatory Visit: Payer: Medicare Other | Admitting: Family Medicine

## 2017-08-14 VITALS — BP 152/82 | HR 82 | Temp 98.4°F | Ht 71.0 in | Wt 176.4 lb

## 2017-08-14 DIAGNOSIS — I152 Hypertension secondary to endocrine disorders: Secondary | ICD-10-CM

## 2017-08-14 DIAGNOSIS — E1159 Type 2 diabetes mellitus with other circulatory complications: Secondary | ICD-10-CM | POA: Diagnosis not present

## 2017-08-14 DIAGNOSIS — I10 Essential (primary) hypertension: Secondary | ICD-10-CM

## 2017-08-14 NOTE — Progress Notes (Signed)
   Subjective:  Scott Herman is a 84 y.o. male who presents today for same-day appointment with a chief complaint of elevated blood pressure reading.   HPI:  Hypertension, established problem, Worsening BP Readings from Last 3 Encounters:  08/14/17 (!) 152/82  04/20/17 128/72  01/06/17 136/72  Several year history.  Has been on lisinopril 5 mg in the past.  Over the last week has noted elevated blood pressures into the 536U and 440H systolics.  Diastolic blood pressures have been into the 70s and 80s.  No associated episodes of chest pain or shortness of breath.  He has had a couple of nosebleeds since then.  No obvious precipitating events.  No recent stressors.  No large salt intake.  ROS: Per HPI  PMH: He reports that he has never smoked. He has never used smokeless tobacco. He reports that he does not drink alcohol or use drugs.  Objective:  Physical Exam: BP (!) 152/82 (BP Location: Left Arm, Patient Position: Sitting, Cuff Size: Normal)   Pulse 82   Temp 98.4 F (36.9 C) (Oral)   Ht 5\' 11"  (1.803 m)   Wt 176 lb 6.4 oz (80 kg)   SpO2 94%   BMI 24.60 kg/m   Gen: NAD, resting comfortably CV: RRR with no murmurs appreciated Pulm: NWOB, CTAB with no crackles, wheezes, or rhonchi MSK: Trace pretibial edema bilaterally.  Assessment/Plan:  Hypertension associated with diabetes ([place]) Above goal to 152/82 today.  We will restart his lisinopril at 10 mg daily.  Patient is concerned because he thinks that his current dose wears off after 4 to 6 hours.  Advised patient to take 10 mg in the morning to see how his blood pressures read.  If he notes that he has increasing blood pressures in the afternoon, advised patient that he can take 5 mg in the morning and 5 mg in the afternoon.  He will be following up with his endocrinologist tomorrow for blood work.  Discussed reasons to return to care.  Discussed home blood pressure monitoring with goal 140/90 or lower.  Time Spent: I spent  28 minutes face-to-face with the patient, with more than half spent on counseling for management plan and underlying etiology for his elevated blood pressure readings.[name]. [name], MD 08/14/2017 4:45 PM

## 2017-08-14 NOTE — Assessment & Plan Note (Signed)
Above goal to 152/82 today.  We will restart his lisinopril at 10 mg daily.  Patient is concerned because he thinks that his current dose wears off after 4 to 6 hours.  Advised patient to take 10 mg in the morning to see how his blood pressures read.  If he notes that he has increasing blood pressures in the afternoon, advised patient that he can take 5 mg in the morning and 5 mg in the afternoon.  He will be following up with his endocrinologist tomorrow for blood work.  Discussed reasons to return to care.  Discussed home blood pressure monitoring with goal 140/90 or lower.

## 2017-08-14 NOTE — Patient Instructions (Addendum)
It was very nice to see you today!  Please start the lisinopril 10mg  daily. If you find that your pressures are increasing in the afternoon, you can take 1 pill in the morning and 1 pill in the afternoon.  Please keep a close eye on your blood pressure and let me know if your blood pressure is persistently 140/90 or higher.   Take care, Dr [name]

## 2017-08-14 NOTE — Telephone Encounter (Signed)
Spoke with pt, he stated he already have an appt with [facility] July 8th.

## 2017-08-15 ENCOUNTER — Encounter: Payer: Self-pay | Admitting: Cardiology

## 2017-08-15 DIAGNOSIS — E782 Mixed hyperlipidemia: Secondary | ICD-10-CM | POA: Diagnosis not present

## 2017-08-15 DIAGNOSIS — Z794 Long term (current) use of insulin: Secondary | ICD-10-CM | POA: Diagnosis not present

## 2017-08-15 DIAGNOSIS — E1165 Type 2 diabetes mellitus with hyperglycemia: Secondary | ICD-10-CM | POA: Diagnosis not present

## 2017-08-16 ENCOUNTER — Encounter (INDEPENDENT_AMBULATORY_CARE_PROVIDER_SITE_OTHER): Payer: Medicare Other | Admitting: Ophthalmology

## 2017-08-16 ENCOUNTER — Encounter (HOSPITAL_COMMUNITY): Payer: Self-pay

## 2017-08-16 ENCOUNTER — Other Ambulatory Visit: Payer: Self-pay

## 2017-08-16 ENCOUNTER — Emergency Department (HOSPITAL_COMMUNITY)
Admission: EM | Admit: 2017-08-16 | Discharge: 2017-08-16 | Disposition: A | Discharge status: Home / Self Care | Payer: Medicare Other | Attending: Emergency Medicine | Admitting: Emergency Medicine

## 2017-08-16 DIAGNOSIS — Z794 Long term (current) use of insulin: Secondary | ICD-10-CM | POA: Diagnosis not present

## 2017-08-16 DIAGNOSIS — E119 Type 2 diabetes mellitus without complications: Secondary | ICD-10-CM | POA: Insufficient documentation

## 2017-08-16 DIAGNOSIS — I10 Essential (primary) hypertension: Secondary | ICD-10-CM | POA: Diagnosis not present

## 2017-08-16 DIAGNOSIS — Z79899 Other long term (current) drug therapy: Secondary | ICD-10-CM | POA: Insufficient documentation

## 2017-08-16 HISTORY — DX: Essential (primary) hypertension: I10

## 2017-08-16 NOTE — Discharge Instructions (Addendum)
Please read the instructions provided on elevated blood pressure and how to take your blood pressures. As discussed, we would strongly recommend that you do not take any blood pressure medicine unless your top blood pressure number is higher than 180. After you have kept a log of your blood pressure check for at least a week, see your primary care doctor to see if the need to adjust your medication.

## 2017-08-16 NOTE — ED Triage Notes (Signed)
Patient reports that his BP has been elevated. Patient recently saw his PCP and Lisinopril was increased to Lisinopril 5 mg bid. Patient did as instructed and BP remained elevated. Patient states he took Lisinopril 15 mg on his own. Today. BP-144/8o in triage.

## 2017-08-17 ENCOUNTER — Encounter: Payer: Self-pay | Admitting: Physical Therapy

## 2017-08-17 DIAGNOSIS — B3749 Other urogenital candidiasis: Secondary | ICD-10-CM | POA: Diagnosis not present

## 2017-08-17 DIAGNOSIS — N529 Male erectile dysfunction, unspecified: Secondary | ICD-10-CM | POA: Diagnosis not present

## 2017-08-17 DIAGNOSIS — N401 Enlarged prostate with lower urinary tract symptoms: Secondary | ICD-10-CM | POA: Diagnosis not present

## 2017-08-17 DIAGNOSIS — R351 Nocturia: Secondary | ICD-10-CM | POA: Diagnosis not present

## 2017-08-17 NOTE — ED Provider Notes (Signed)
[facility] DEPT Provider Note   CSN: [id number] Arrival date & time: 08/16/17  0808     History   Chief Complaint Chief Complaint  Patient presents with  . Hypertension    HPI Scott Herman is a 84 y.o. male.  HPI  84 year old male comes in with chief complaint of elevated blood pressure. Patient was recently diagnosed with hypertension and started on 2.5 mg of lisinopril.  He decided to just recheck his blood pressure, and noted that the systolics were in the 332R and informed his primary care doctor who increased his lisinopril dose to 5 mg daily.  Subsequent checkup have led to the lisinopril dose to be increased to 5 mg twice a day.  This morning, patient wanted to check his pressure one more time and he noted that it was in the 140s over 90s.  Patient denied any associated chest pain, shortness of breath, headaches, or any neurologic symptoms. He has a friend who eventually had hypertensive stroke, so patient took 10 mg of lisinopril and rechecked his blood pressure -which had not come down, so he decided to come to the ER.  Past Medical History:  Diagnosis Date  . Abnormal stress electrocardiogram test Jan 2013   Negative cardiac catheterization.   . Diabetes mellitus, type 2 (HCC)    Dr Altheimer  . Diverticulosis   . GERD (gastroesophageal reflux disease)   . Hiatal hernia   . Hyperlipidemia   . Hypertension   . Normal cardiac stress test Jan 2009   Normal ETT  . Palpitations    Negative holter in 2009 except for tachycardia with bicycling    Patient Active Problem List   Diagnosis Date Noted  . Generalized weakness 01/05/2017  . Benign neoplasm of sigmoid colon   . Lower GI bleed 12/20/2016  . BPH (benign prostatic hyperplasia) 01/12/2015  . Hypertension associated with diabetes ([place]) 06/26/2014  . Abnormal stress test 04/19/2011  . GERD 08/27/2009  . DIVERTICULOSIS-COLON 08/27/2009  . Diabetes ([place]) 05/28/2007  .  Microscopic hematuria 05/28/2007  . Nocturia 05/28/2007  . Palpitations 02/09/2007    Past Surgical History:  Procedure Laterality Date  . CARDIAC CATHETERIZATION  Feb 2013   Mild nonobstructive CAD. Normal LV function  . CHOLECYSTECTOMY    . COLONOSCOPY     Dr Fuller Plan  . COLONOSCOPY WITH PROPOFOL N/A 12/22/2016   Procedure: COLONOSCOPY WITH PROPOFOL;  Surgeon: Gatha Mayer, MD;  Location: [facility];  Service: Endoscopy;  Laterality: N/A;  . HEMORRHOID SURGERY    . INGUINAL HERNIA REPAIR    . ORIF CLAVICULAR FRACTURE     left  . TONSILLECTOMY          Home Medications    Prior to Admission medications   Medication Sig Start Date End Date Taking? Authorizing Provider  acetaminophen (TYLENOL) 500 MG tablet Take 1,000 mg by mouth daily as needed for moderate pain.    [provider]  Ascorbic Acid (VITAMIN C) 1000 MG tablet Take 1,000 mg by mouth daily.    [provider]  aspirin 81 MG tablet Take 1 tablet (81 mg total) by mouth at bedtime. 12/29/16   Charlynne Cousins, MD  CINNAMON PO Take 2,000 mg by mouth at bedtime.    [provider]  co-enzyme Q-10 30 MG capsule Take 30 mg by mouth daily.      [provider]  esomeprazole (NEXIUM) 40 MG capsule Take 1 capsule (40 mg total) by mouth daily  before breakfast. Patient taking differently: Take 20 mg by mouth daily before breakfast.  04/25/12   Ladene Artist, MD  Insulin Glargine (TOUJEO SOLOSTAR [place]) Inject 24 Units into the skin daily.     [provider]  lisinopril (PRINIVIL,ZESTRIL) 5 MG tablet Take 10 mg by mouth daily.    [provider]  meclizine (ANTIVERT) 25 MG tablet Take 1 tablet (25 mg total) 3 (three) times daily as needed by mouth for dizziness. 01/06/17   Vivi Barrack, MD  metFORMIN (GLUCOPHAGE-XR) 750 MG 24 hr tablet Take 750 mg by mouth 2 (two) times daily.      [provider]  Multiple Vitamins-Minerals (MENS 50+ MULTI VITAMIN/MIN) TABS  Take 1 tablet by mouth daily.    [provider]  pioglitazone (ACTOS) 45 MG tablet Take 45 mg by mouth daily.    [provider]  pravastatin (PRAVACHOL) 80 MG tablet Take 80 mg by mouth at bedtime.     [provider]  Tamsulosin HCl (FLOMAX) 0.4 MG CAPS Take 0.4 mg by mouth daily.      [provider]    Family History Family History  Problem Relation Age of Onset  . Prostate cancer Father   . Diabetes Father   . Diverticulosis Father   . Stroke Mother        > 60  . Hypertension Brother   . Diabetes Brother   . Prostate cancer Paternal Grandfather   . Diabetes Paternal Grandmother   . Colon cancer Neg Hx     Social History Social History   Tobacco Use  . Smoking status: Never Smoker  . Smokeless tobacco: Never Used  Substance Use Topics  . Alcohol use: No    Alcohol/week: 0.0 oz  . Drug use: No     Allergies   Patient has no known allergies.   Review of Systems Review of Systems  Constitutional: Negative for activity change.  Eyes: Negative for visual disturbance.  Respiratory: Negative for shortness of breath.   Cardiovascular: Negative for chest pain.  Neurological: Negative for dizziness, facial asymmetry, speech difficulty, light-headedness, numbness and headaches.     Physical Exam Updated Vital Signs BP 116/79 (BP Location: Left Arm)   Pulse 92   Temp 98.6 F (37 C) (Oral)   Resp 16   Ht 5\' 11"  (1.803 m)   Wt 79.8 kg (176 lb)   SpO2 96%   BMI 24.55 kg/m   Physical Exam  Constitutional: He is oriented to person, place, and time. He appears well-developed.  HENT:  Head: Atraumatic.  Neck: Neck supple.  Cardiovascular: Normal rate.  Pulmonary/Chest: Effort normal.  Neurological: He is alert and oriented to person, place, and time.  Skin: Skin is warm.  Nursing note and vitals reviewed.    ED Treatments / Results  Labs (all labs ordered are listed, but only abnormal results are displayed) Labs  Reviewed - No data to display  EKG None  Radiology No results found.  Procedures Procedures (including critical care time)  Medications Ordered in ED Medications - No data to display   Initial Impression / Assessment and Plan / ED Course  I have reviewed the triage vital signs and the nursing notes.  Pertinent labs & imaging results that were available during my care of the patient were reviewed by me and considered in my medical decision making (see chart for details).     84 year old male comes in with chief complaint of elevated blood pressure.  It appears that he is a bit sensitive to the hypertension because he had a friend who had a hypertensive stroke.  His blood pressure are in the 140s over 80s in the ER, and I explained to him that he needs to continue seeing his primary care doctor who has been working with him to slowly adjust his medications.  Patient was appreciated of [place] explaining him the role of PCP to manage chronic conditions like hypertension and dangers of starting medication based on limited data that can lead to drop in blood pressure and unintended consequences.  Stable for discharge.  Final Clinical Impressions(s) / ED Diagnoses   Final diagnoses:  Essential hypertension    ED Discharge Orders    None       Varney Biles, MD 08/17/17 1045

## 2017-08-24 ENCOUNTER — Telehealth: Payer: Self-pay | Admitting: Family Medicine

## 2017-08-24 ENCOUNTER — Other Ambulatory Visit: Payer: Self-pay

## 2017-08-24 MED ORDER — LISINOPRIL 5 MG PO TABS: 10.0000 mg | ORAL_TABLET | Freq: Every day | ORAL | 3 refills | Status: DC

## 2017-08-24 NOTE — Telephone Encounter (Signed)
Rx sent to pharmacy   

## 2017-08-24 NOTE — Telephone Encounter (Signed)
See note.   Copied from [place] [phone]. Topic: General - Other >> Aug 24, 2017  9:05 AM Oneta Rack wrote: Relation to pt: self Call back [place]: [place], [place] - [place] [address] [phone] (Phone) [phone] (Fax)   Reason for call:  Patient requesting lisinopril (PRINIVIL,ZESTRIL) 5 MG tablet refill, informed patient please allow 72 hour turn around time, patient will contact pharmacy in the future.

## 2017-08-28 ENCOUNTER — Encounter: Payer: Self-pay | Admitting: Family Medicine

## 2017-08-28 ENCOUNTER — Ambulatory Visit: Payer: Medicare Other | Admitting: Family Medicine

## 2017-08-28 VITALS — BP 136/84 | HR 67 | Temp 97.6°F | Ht 71.0 in | Wt 174.8 lb

## 2017-08-28 DIAGNOSIS — I152 Hypertension secondary to endocrine disorders: Secondary | ICD-10-CM

## 2017-08-28 DIAGNOSIS — I10 Essential (primary) hypertension: Secondary | ICD-10-CM

## 2017-08-28 DIAGNOSIS — G4700 Insomnia, unspecified: Secondary | ICD-10-CM | POA: Diagnosis not present

## 2017-08-28 DIAGNOSIS — N4889 Other specified disorders of penis: Secondary | ICD-10-CM

## 2017-08-28 DIAGNOSIS — E1159 Type 2 diabetes mellitus with other circulatory complications: Secondary | ICD-10-CM | POA: Diagnosis not present

## 2017-08-28 MED ORDER — AMLODIPINE BESYLATE 5 MG PO TABS: 5.0000 mg | ORAL_TABLET | Freq: Every day | ORAL | 3 refills | Status: DC

## 2017-08-28 NOTE — Progress Notes (Signed)
   Subjective:  Scott Herman is a 84 y.o. male who presents today with a chief complaint of hypertension follow up.   HPI:  Hypertension, chronic problem, stable Patient currently on lisinopril 10mg  daily. Checks BP at home and they are usually in the 140s-150s/80s-90. No reported chest pain or shortness of breath. No recent changes to diet. No increased salt intake.  No dizziness or palpitations  Insomnia, chronic problem, worsening Several year history.  Worsened recently.  Usually due to nocturia, however has some difficulty falling back asleep.  He is interested in starting melatonin supplements to see if this helps.  He has tried cutting back caffeine without significant improvement in symptoms.  Thinks his lack of sleep is causing blood pressure elevation.  Penile irritation, chronic problem, stable Has been treated with a course of Diflucan for this.  Has seen his urologist who recommended possible circumcision.  Patient is not sure he wants to go through this.  ROS: Per HPI  PMH: He reports that he has never smoked. He has never used smokeless tobacco. He reports that he does not drink alcohol or use drugs.  Objective:  Physical Exam: BP 136/84 (BP Location: Left Arm, Patient Position: Sitting, Cuff Size: Normal)   Pulse 67   Temp 97.6 F (36.4 C) (Oral)   Ht 5\' 11"  (1.803 m)   Wt 174 lb 12.8 oz (79.3 kg)   SpO2 97%   BMI 24.38 kg/m   Gen: NAD, resting comfortably CV: RRR with no murmurs appreciated Pulm: NWOB, CTAB with no crackles, wheezes, or rhonchi GU: Normal male genitalia.  Very faint erythematous ring noted on foreskin. MSK: No edema, cyanosis, or clubbing noted  Assessment/Plan:  Hypertension associated with diabetes ([place]) At goal today, however is been slightly above goal at home.  Will add amlodipine 5 mg daily to his regimen.  Continue lisinopril 10 mg daily.  Discussed natural course of essential hypertension and that it typically rises this patient's  age.  Patient is concerned something else may be causing his elevated blood pressure.  Reassurance was given.  Recommended regular exercise and low-salt diet.  Discussed home blood pressure monitoring with goal 150/90 or lower.  Discussed reasons to return to care.  Patient thinks his lack of sleep is causing blood pressure elevation. He will start melatonin for this-see below problem.  Insomnia Patient will start melatonin supplement.  Discussed reducing caffeine intake.  Follow-up as needed.  Penile irritation Unclear etiology.  No significant abnormality on his exam today.  Defer further management to urology.  Patient is debating having a circumcision performed or not.   [name]. [name], MD 08/28/2017 11:37 AM

## 2017-08-28 NOTE — Assessment & Plan Note (Addendum)
At goal today, however is been slightly above goal at home.  Will add amlodipine 5 mg daily to his regimen.  Continue lisinopril 10 mg daily.  Discussed natural course of essential hypertension and that it typically rises this patient's age.  Patient is concerned something else may be causing his elevated blood pressure.  Reassurance was given.  Recommended regular exercise and low-salt diet.  Discussed home blood pressure monitoring with goal 150/90 or lower.  Discussed reasons to return to care.  Patient thinks his lack of sleep is causing blood pressure elevation. He will start melatonin for this-see below problem.

## 2017-08-28 NOTE — Assessment & Plan Note (Signed)
Patient will start melatonin supplement.  Discussed reducing caffeine intake.  Follow-up as needed.

## 2017-08-28 NOTE — Assessment & Plan Note (Signed)
Unclear etiology.  No significant abnormality on his exam today.  Defer further management to urology.  Patient is debating having a circumcision performed or not.

## 2017-09-04 ENCOUNTER — Ambulatory Visit: Payer: Medicare Other | Admitting: Cardiology

## 2017-09-13 DIAGNOSIS — N476 Balanoposthitis: Secondary | ICD-10-CM | POA: Diagnosis not present

## 2017-09-15 ENCOUNTER — Encounter (INDEPENDENT_AMBULATORY_CARE_PROVIDER_SITE_OTHER): Payer: Medicare Other | Admitting: Ophthalmology

## 2017-09-15 DIAGNOSIS — H353132 Nonexudative age-related macular degeneration, bilateral, intermediate dry stage: Secondary | ICD-10-CM

## 2017-09-15 DIAGNOSIS — H2513 Age-related nuclear cataract, bilateral: Secondary | ICD-10-CM

## 2017-09-15 DIAGNOSIS — E113293 Type 2 diabetes mellitus with mild nonproliferative diabetic retinopathy without macular edema, bilateral: Secondary | ICD-10-CM | POA: Diagnosis not present

## 2017-09-15 DIAGNOSIS — E11319 Type 2 diabetes mellitus with unspecified diabetic retinopathy without macular edema: Secondary | ICD-10-CM

## 2017-09-15 DIAGNOSIS — H43813 Vitreous degeneration, bilateral: Secondary | ICD-10-CM | POA: Diagnosis not present

## 2017-09-15 LAB — HM DIABETES EYE EXAM

## 2017-09-19 ENCOUNTER — Encounter: Payer: Self-pay | Admitting: Family Medicine

## 2017-10-07 DIAGNOSIS — E1165 Type 2 diabetes mellitus with hyperglycemia: Secondary | ICD-10-CM | POA: Diagnosis not present

## 2017-10-27 ENCOUNTER — Ambulatory Visit (INDEPENDENT_AMBULATORY_CARE_PROVIDER_SITE_OTHER): Payer: Medicare Other

## 2017-10-27 VITALS — BP 118/68 | HR 82 | Ht 71.0 in | Wt 177.2 lb

## 2017-10-27 DIAGNOSIS — Z0000 Encounter for general adult medical examination without abnormal findings: Secondary | ICD-10-CM | POA: Diagnosis not present

## 2017-10-27 NOTE — Progress Notes (Addendum)
Subjective:   Scott Herman is a 84 y.o. male who presents for Medicare Annual/Subsequent preventive examination.  Review of Systems:  No ROS.  Medicare Wellness Visit. Additional risk factors are reflected in the social history. Cardiac Risk Factors include: advanced age (>9men, >58 women);male gender;diabetes mellitus Patient lives in a single story home with wife of 81 years. They have 3 sons. No pets. Enjoys biking, active in church, loves spending time with wife, enjoys socializing, loves to travel.  Patient goes to bed around 10:30-11:30. Gets up around 3 Times a night to go to the bathroom. Gets up around [place] rested sometimes. No CPAP.    Objective:    Vitals: BP 118/68 (BP Location: Left Arm, Patient Position: Sitting, Cuff Size: Large)   Pulse 82   Ht 5\' 11"  (1.803 m)   Wt 177 lb 3.2 oz (80.4 kg)   SpO2 96%   BMI 24.71 kg/m   Body mass index is 24.71 kg/m.  Advanced Directives 10/27/2017 12/20/2016 02/18/2016 04/01/2011  Does Patient Have a Medical Advance Directive? Yes No Yes Patient does not have advance directive  Type of Advance Directive Healthcare Power of [place];Living will -  Does patient want to make changes to medical advance directive? No - Patient declined - No - Patient declined -  Copy of [place] in Chart? No - copy requested - No - copy requested -  Would patient like information on creating a medical advance directive? - No - Patient declined - -  Pre-existing out of facility DNR order (yellow form or pink MOST form) - - - No    Tobacco Social History   Tobacco Use  Smoking Status Never Smoker  Smokeless Tobacco Never Used     Counseling given: Not Answered   Past Medical History:  Diagnosis Date  . Abnormal stress electrocardiogram test Jan 2013   Negative cardiac catheterization.   . Diabetes mellitus, type 2 (HCC)    Dr Altheimer  . Diverticulosis   . GERD (gastroesophageal  reflux disease)   . Hiatal hernia   . Hyperlipidemia   . Hypertension   . Normal cardiac stress test Jan 2009   Normal ETT  . Palpitations    Negative holter in 2009 except for tachycardia with bicycling   Past Surgical History:  Procedure Laterality Date  . CARDIAC CATHETERIZATION  Feb 2013   Mild nonobstructive CAD. Normal LV function  . CHOLECYSTECTOMY    . COLONOSCOPY     Dr Fuller Plan  . COLONOSCOPY WITH PROPOFOL N/A 12/22/2016   Procedure: COLONOSCOPY WITH PROPOFOL;  Surgeon: Gatha Mayer, MD;  Location: [facility];  Service: Endoscopy;  Laterality: N/A;  . HEMORRHOID SURGERY    . INGUINAL HERNIA REPAIR    . ORIF CLAVICULAR FRACTURE     left  . TONSILLECTOMY     Family History  Problem Relation Age of Onset  . Prostate cancer Father   . Diabetes Father   . Diverticulosis Father   . Hypertension Brother   . Diabetes Brother   . Prostate cancer Paternal Grandfather   . Diabetes Paternal Grandmother   . Colon cancer Neg Hx    Social History   Socioeconomic History  . Marital status: Married    Spouse name: Not on file  . Number of children: Not on file  . Years of education: Not on file  . Highest education level: Not on file  Occupational History  . Occupation: Retired  Social Needs  . Financial resource strain: Not on file  . Food insecurity:    Worry: Not on file    Inability: Not on file  . Transportation needs:    Medical: Not on file    Non-medical: Not on file  Tobacco Use  . Smoking status: Never Smoker  . Smokeless tobacco: Never Used  Substance and Sexual Activity  . Alcohol use: No    Alcohol/week: 0.0 standard drinks  . Drug use: No  . Sexual activity: Yes  Lifestyle  . Physical activity:    Days per week: Not on file    Minutes per session: Not on file  . Stress: Not on file  Relationships  . Social connections:    Talks on phone: Not on file    Gets together: Not on file    Attends religious service: Not on file    Active member  of club or organization: Not on file    Attends meetings of clubs or organizations: Not on file    Relationship status: Not on file  Other Topics Concern  . Not on file  Social History Narrative  . Not on file    Outpatient Encounter Medications as of 10/27/2017  Medication Sig  . acetaminophen (TYLENOL) 500 MG tablet Take 1,000 mg by mouth daily as needed for moderate pain.  [name] Kitchen amLODipine (NORVASC) 5 MG tablet Take 1 tablet (5 mg total) by mouth daily.  . Ascorbic Acid (VITAMIN C) 1000 MG tablet Take 1,000 mg by mouth daily.  [name] Kitchen aspirin 81 MG tablet Take 1 tablet (81 mg total) by mouth at bedtime.  [name] Kitchen CINNAMON PO Take 2,000 mg by mouth at bedtime.  [name] Kitchen co-enzyme Q-10 30 MG capsule Take 30 mg by mouth daily.    [name] Kitchen esomeprazole (NEXIUM) 40 MG capsule Take 1 capsule (40 mg total) by mouth daily before breakfast. (Patient taking differently: Take 20 mg by mouth daily before breakfast. )  . Insulin Glargine (TOUJEO SOLOSTAR [place]) Inject 24 Units into the skin daily.   . metFORMIN (GLUCOPHAGE-XR) 750 MG 24 hr tablet Take 750 mg by mouth 2 (two) times daily.    . Multiple Vitamins-Minerals (MENS 50+ MULTI VITAMIN/MIN) TABS Take 1 tablet by mouth daily.  . pioglitazone (ACTOS) 45 MG tablet Take 45 mg by mouth daily.  . pravastatin (PRAVACHOL) 80 MG tablet Take 80 mg by mouth at bedtime.   . Tamsulosin HCl (FLOMAX) 0.4 MG CAPS Take 0.4 mg by mouth daily.    . [DISCONTINUED] lisinopril (PRINIVIL,ZESTRIL) 5 MG tablet Take 2 tablets (10 mg total) by mouth daily.  . [DISCONTINUED] meclizine (ANTIVERT) 25 MG tablet Take 1 tablet (25 mg total) 3 (three) times daily as needed by mouth for dizziness.   No facility-administered encounter medications on file as of 10/27/2017.     Activities of Daily Living In your present state of health, do you have any difficulty performing the following activities: 10/27/2017 12/20/2016  Hearing? N -  Vision? N -  Difficulty concentrating or making decisions? N -  Walking  or climbing stairs? N -  Dressing or bathing? N -  Doing errands, shopping? N N  Preparing Food and eating ? N -  Using the Toilet? N -  In the past six months, have you accidently leaked urine? N -  Do you have problems with loss of bowel control? N -  Managing your Medications? N -  Managing your Finances? N -  Housekeeping or managing your Housekeeping? N -  Some recent data might be hidden    Patient Care Team: Vivi Barrack, MD as PCP - General (Family Medicine) Festus Aloe, MD as Consulting Physician (Urology) Minus Breeding, MD as Consulting Physician (Cardiology) Ladene Artist, MD as Consulting Physician (Gastroenterology) Altheimer, Scott [place]o, MD as Consulting Physician (Endocrinology) Hayden Pedro, MD as Consulting Physician (Ophthalmology) [place], [place] (Specialist)   Assessment:   This is a routine wellness examination for Scott Herman.  Exercise Activities and Dietary recommendations Current Exercise Habits: Structured exercise class([employer]), Type of exercise: strength training/weights;Other - see comments(biking), Time (Minutes): 60, Frequency (Times/Week): 2, Weekly Exercise (Minutes/Week): 120, Intensity: Mild, Exercise limited by: None identified   Breakfast: Cereal with fruit, Bacon, coffee with creamer and Truvia  Lunch: Sandwich thin, slice of cheese, slice of Ham, pepperoni, Drinks diet Pepsi or water  Dinner: Chicken sausage gumbo with water to drink Goals    . Weight (lb) < 170 lb (77.1 kg)     Would like to lose 7 lbs by increasing activity.        Fall Risk Fall Risk  10/27/2017 02/18/2016 05/12/2014 02/26/2013  Falls in the past year? No No No No    Depression Screen PHQ 2/9 Scores 10/27/2017 02/18/2016 05/12/2014 02/26/2013  PHQ - 2 Score 0 0 0 0    Cognitive Function     6CIT Screen 10/27/2017  What Year? 0 points  What month? 0 points  What time? 0 points  Count back from 20 0 points  Months in reverse 0  points    Immunization History  Administered Date(s) Administered  . Influenza, High Dose Seasonal PF 01/13/2014  . Influenza-Unspecified 12/08/2014, 01/04/2016, 11/28/2016  . Pneumococcal Conjugate-13 02/18/2016  . Zoster 02/26/2010      Screening Tests Health Maintenance  Topic Date Due  . TETANUS/TDAP  05/03/1952  . PNA vac Low Risk Adult (2 of 2 - PPSV23) 02/17/2017  . INFLUENZA VACCINE  09/28/2017  . HEMOGLOBIN A1C  02/07/2018  . FOOT EXAM  05/17/2018  . OPHTHALMOLOGY EXAM  09/16/2018       Plan:    Follow Up with PCP as advised  I have personally reviewed and noted the following in the patient's chart:   . Medical and social history . Use of alcohol, tobacco or illicit drugs  . Current medications and supplements . Functional ability and status . Nutritional status . Physical activity . Advanced directives . List of other physicians  . Vitals . Screenings to include cognitive, depression, and falls . Referrals and appointments  In addition, I have reviewed and discussed with patient certain preventive protocols, quality metrics, and best practice recommendations. A written personalized care plan for preventive services as well as general preventive health recommendations were provided to patient.     [place], LPN  09/09/4578  I have personally reviewed the Medicare Annual Wellness questionnaire and have noted 1. The patient's medical and social history 2. Their use of alcohol, tobacco or illicit drugs 3. Their current medications and supplements 4. The patient's functional ability including ADL's, fall risks, home safety risks and hearing or visual impairment. 5. Diet and physical activities 6. Evidence for depression or mood disorders 7. Reviewed Updated provider list, see scanned forms and [facility] Snapshot.   The patients weight, height, BMI and visual acuity have been recorded in the chart I have made referrals, counseling and provided  education to the patient based review of the above and I have provided the pt with a  written personalized care plan for preventive services.  I have provided the patient with a copy of your personalized plan for preventive services. Instructed to take the time to review along with their updated medication list.  Inda Coke PA-C

## 2017-10-27 NOTE — Patient Instructions (Signed)
Scott Herman , Thank you for taking time to come for your Medicare Wellness Visit. I appreciate your ongoing commitment to your health goals. Please review the following plan we discussed and let me know if I can assist you in the future.   These are the goals we discussed: Goals    . Weight (lb) < 170 lb (77.1 kg)     Would like to lose 7 lbs by increasing activity.        This is a list of the screening recommended for you and due dates:  Health Maintenance  Topic Date Due  . Tetanus Vaccine  05/03/1952  . Pneumonia vaccines (2 of 2 - PPSV23) 02/17/2017  . Flu Shot  09/28/2017  . Hemoglobin A1C  02/07/2018  . Complete foot exam   05/17/2018  . Eye exam for diabetics  09/16/2018   Preventive Care for Adults  A healthy lifestyle and preventive care can promote health and wellness. Preventive health guidelines for adults include the following key practices.  . A routine yearly physical is a good way to check with your health care provider about your health and preventive screening. It is a chance to share any concerns and updates on your health and to receive a thorough exam.  . Visit your dentist for a routine exam and preventive care every 6 months. Brush your teeth twice a day and floss once a day. Good oral hygiene prevents tooth decay and gum disease.  . The frequency of eye exams is based on your age, health, family medical history, use  of contact lenses, and other factors. Follow your health care provider's recommendations for frequency of eye exams.  . Eat a healthy diet. Foods like vegetables, fruits, whole grains, low-fat dairy products, and lean protein foods contain the nutrients you need without too many calories. Decrease your intake of foods high in solid fats, added sugars, and salt. Eat the right amount of calories for you. Get information about a proper diet from your health care provider, if necessary.  . Regular physical exercise is one of the most important  things you can do for your health. Most adults should get at least 150 minutes of moderate-intensity exercise (any activity that increases your heart rate and causes you to sweat) each week. In addition, most adults need muscle-strengthening exercises on 2 or more days a week.  Silver Sneakers may be a benefit available to you. To determine eligibility, you may visit the website: www.silversneakers.com or contact program at (743)135-0274 Mon-Fri between 8AM-8PM.   . Maintain a healthy weight. The body mass index (BMI) is a screening tool to identify possible weight problems. It provides an estimate of body fat based on height and weight. Your health care provider can find your BMI and can help you achieve or maintain a healthy weight.   For adults 20 years and older: ? A BMI below 18.5 is considered underweight. ? A BMI of 18.5 to 24.9 is normal. ? A BMI of 25 to 29.9 is considered overweight. ? A BMI of 30 and above is considered obese.   . Maintain normal blood lipids and cholesterol levels by exercising and minimizing your intake of saturated fat. Eat a balanced diet with plenty of fruit and vegetables. Blood tests for lipids and cholesterol should begin at age 58 and be repeated every 5 years. If your lipid or cholesterol levels are high, you are over 50, or you are at high risk for heart disease, you may need  your cholesterol levels checked more frequently. Ongoing high lipid and cholesterol levels should be treated with medicines if diet and exercise are not working.  . If you smoke, find out from your health care provider how to quit. If you do not use tobacco, please do not start.  . If you choose to drink alcohol, please do not consume more than 2 drinks per day. One drink is considered to be 12 ounces (355 mL) of beer, 5 ounces (148 mL) of wine, or 1.5 ounces (44 mL) of liquor.  . If you are 51-51 years old, ask your health care provider if you should take aspirin to prevent  strokes.  . Use sunscreen. Apply sunscreen liberally and repeatedly throughout the day. You should seek shade when your shadow is shorter than you. Protect yourself by wearing long sleeves, pants, a wide-brimmed hat, and sunglasses year round, whenever you are outdoors.  . Once a month, do a whole body skin exam, using a mirror to look at the skin on your back. Tell your health care provider of new moles, moles that have irregular borders, moles that are larger than a pencil eraser, or moles that have changed in shape or color.

## 2017-10-27 NOTE — Progress Notes (Signed)
PCP notes: Last OV 08/28/17:Hypertension associated with diabetes ([place]) At goal today, however is been slightly above goal at home.  Will add amlodipine 5 mg daily to his regimen.  Continue lisinopril 10 mg daily.  Discussed natural course of essential hypertension and that it typically rises this patient's age.  Patient is concerned something else may be causing his elevated blood pressure.  Reassurance was given.  Recommended regular exercise and low-salt diet.  Discussed home blood pressure monitoring with goal 150/90 or lower.  Discussed reasons to return to care.  Patient thinks his lack of sleep is causing blood pressure elevation. He will start melatonin for this-see below problem.  Insomnia Patient will start melatonin supplement.  Discussed reducing caffeine intake.  Follow-up as needed.   Health maintenance: Td-will get at pharmacy and Pneumovax 23, Flu shot- discussed today. Will discuss at next visit with Dr. [name]   Abnormal screenings: None   Patient concerns:None    Nurse concerns:None   Next PCP appt: schedule as needed

## 2017-11-08 DIAGNOSIS — Z794 Long term (current) use of insulin: Secondary | ICD-10-CM | POA: Diagnosis not present

## 2017-11-08 DIAGNOSIS — E782 Mixed hyperlipidemia: Secondary | ICD-10-CM | POA: Diagnosis not present

## 2017-11-08 DIAGNOSIS — E1165 Type 2 diabetes mellitus with hyperglycemia: Secondary | ICD-10-CM | POA: Diagnosis not present

## 2017-11-08 LAB — BASIC METABOLIC PANEL
BUN: 20 (ref 4–21)
Creatinine: 0.9 (ref 0.6–1.3)
GLUCOSE: 74
POTASSIUM: 4 (ref 3.4–5.3)
Sodium: 139 (ref 137–147)

## 2017-11-08 LAB — HEPATIC FUNCTION PANEL
ALT: 18 (ref 10–40)
AST: 20 (ref 14–40)
Alkaline Phosphatase: 69 (ref 25–125)
Bilirubin, Total: 0.6

## 2017-11-08 LAB — LIPID PANEL
Cholesterol: 116 (ref 0–200)
HDL: 49 (ref 35–70)
LDL Cholesterol: 47
Triglycerides: 75 (ref 40–160)

## 2017-11-08 LAB — HEMOGLOBIN A1C: Hemoglobin A1C: 6.3

## 2017-11-14 DIAGNOSIS — E782 Mixed hyperlipidemia: Secondary | ICD-10-CM | POA: Diagnosis not present

## 2017-11-14 DIAGNOSIS — Z794 Long term (current) use of insulin: Secondary | ICD-10-CM | POA: Diagnosis not present

## 2017-11-14 DIAGNOSIS — E1165 Type 2 diabetes mellitus with hyperglycemia: Secondary | ICD-10-CM | POA: Diagnosis not present

## 2017-11-17 DIAGNOSIS — N401 Enlarged prostate with lower urinary tract symptoms: Secondary | ICD-10-CM | POA: Diagnosis not present

## 2017-11-17 DIAGNOSIS — N476 Balanoposthitis: Secondary | ICD-10-CM | POA: Diagnosis not present

## 2017-11-17 DIAGNOSIS — R350 Frequency of micturition: Secondary | ICD-10-CM | POA: Diagnosis not present

## 2017-11-21 DIAGNOSIS — M25561 Pain in right knee: Secondary | ICD-10-CM | POA: Diagnosis not present

## 2017-11-23 ENCOUNTER — Encounter: Payer: Self-pay | Admitting: Physical Therapy

## 2017-11-24 DIAGNOSIS — Z85828 Personal history of other malignant neoplasm of skin: Secondary | ICD-10-CM | POA: Diagnosis not present

## 2017-11-24 DIAGNOSIS — L309 Dermatitis, unspecified: Secondary | ICD-10-CM | POA: Diagnosis not present

## 2017-12-08 ENCOUNTER — Ambulatory Visit (INDEPENDENT_AMBULATORY_CARE_PROVIDER_SITE_OTHER): Payer: Medicare Other

## 2017-12-08 ENCOUNTER — Encounter: Payer: Self-pay | Admitting: Family Medicine

## 2017-12-08 DIAGNOSIS — Z23 Encounter for immunization: Secondary | ICD-10-CM | POA: Diagnosis not present

## 2017-12-08 NOTE — Patient Instructions (Signed)
Health Maintenance Due  Topic Date Due  . TETANUS/TDAP  05/03/1952  . PNA vac Low Risk Adult (2 of 2 - PPSV23) 02/17/2017    Depression screen [facility] 2/9 10/27/2017 02/18/2016 05/12/2014  Decreased Interest 0 0 0  Down, Depressed, Hopeless 0 0 0  PHQ - 2 Score 0 0 0

## 2017-12-08 NOTE — Progress Notes (Signed)
I have reviewed the patient's encounter and agree with the documentation.  Scott Herman. [name], MD 12/08/2017 3:29 PM

## 2017-12-08 NOTE — Progress Notes (Signed)
Patient in today for Flu Vaccine. VIS given. Administered in left arm. Patient tolerated well. 

## 2017-12-12 ENCOUNTER — Ambulatory Visit: Payer: Medicare Other | Admitting: Family Medicine

## 2017-12-12 ENCOUNTER — Encounter: Payer: Self-pay | Admitting: Family Medicine

## 2017-12-12 VITALS — BP 124/74 | HR 85 | Temp 97.6°F | Ht 71.0 in | Wt 179.4 lb

## 2017-12-12 DIAGNOSIS — I152 Hypertension secondary to endocrine disorders: Secondary | ICD-10-CM

## 2017-12-12 DIAGNOSIS — N4889 Other specified disorders of penis: Secondary | ICD-10-CM

## 2017-12-12 DIAGNOSIS — E1159 Type 2 diabetes mellitus with other circulatory complications: Secondary | ICD-10-CM

## 2017-12-12 DIAGNOSIS — I10 Essential (primary) hypertension: Secondary | ICD-10-CM

## 2017-12-12 MED ORDER — AMLODIPINE BESYLATE 5 MG PO TABS: 5.0000 mg | ORAL_TABLET | Freq: Every day | ORAL | 3 refills | Status: DC

## 2017-12-12 MED ORDER — KETOCONAZOLE 2 % EX CREA: 1.0000 "application " | TOPICAL_CREAM | Freq: Two times a day (BID) | CUTANEOUS | 0 refills | Status: DC

## 2017-12-12 NOTE — Assessment & Plan Note (Addendum)
Reassured patient that his exam was essentially normal and he did not have any concerning signs or symptoms.  Patient is very distressed about the red discoloration of his foreskin.  Will start topical ketoconazole cream to treat any possible candidal infection.  Also place referral to dermatology for second opinion.

## 2017-12-12 NOTE — Progress Notes (Signed)
   Subjective:  Scott Herman is a 84 y.o. male who presents today with a chief complaint of penile irritation.   HPI:  Penile irritation, established problem, worsening Patient last seen for this about 3 months ago.  He is also seen today for a urologist and a dermatologist in the past for this.  He complains of redness and irritation around the distal part of his foreskin.  Irritation is been present for about a year.  It comes and goes.  Worsened recently.  He has tried several treatments including nystatin ointment, cortisone cream, triamcinolone cream, nystatin powder, and oral Diflucan.  No the strings have significantly seem to help.  Does not have any burning or itching to the area.  HTN Stable.  Takes Norvasc 5 mg daily and tolerates well.  ROS: Per HPI  Objective:  Physical Exam: BP 124/74 (BP Location: Left Arm, Patient Position: Sitting, Cuff Size: Normal)   Pulse 85   Temp 97.6 F (36.4 C) (Oral)   Ht 5\' 11"  (1.803 m)   Wt 179 lb 6.4 oz (81.4 kg)   SpO2 95%   BMI 25.02 kg/m   Gen: NAD, resting comfortably GU: Circumcised penis.  Very minimal amount of erythema noted on distal edge of foreskin.  No purulence.  No drainage.  No spreading erythema.  Assessment/Plan:  Penile irritation Reassured patient that his exam was essentially normal and he did not have any concerning signs or symptoms.  Patient is very distressed about the red discoloration of his foreskin.  Will start topical ketoconazole cream to treat any possible candidal infection.  Also place referral to dermatology for second opinion.    Hypertension associated with diabetes ([place]) At goal.  Refilled Norvasc today.  [name]. [name], MD 12/12/2017 12:11 PM

## 2017-12-12 NOTE — Assessment & Plan Note (Signed)
At goal.  Refilled Norvasc today.

## 2017-12-18 ENCOUNTER — Encounter: Payer: Self-pay | Admitting: Family Medicine

## 2017-12-19 ENCOUNTER — Other Ambulatory Visit: Payer: Self-pay

## 2017-12-19 DIAGNOSIS — N4889 Other specified disorders of penis: Secondary | ICD-10-CM

## 2017-12-25 DIAGNOSIS — N476 Balanoposthitis: Secondary | ICD-10-CM | POA: Diagnosis not present

## 2017-12-25 DIAGNOSIS — N503 Cyst of epididymis: Secondary | ICD-10-CM | POA: Diagnosis not present

## 2018-01-15 DIAGNOSIS — E1165 Type 2 diabetes mellitus with hyperglycemia: Secondary | ICD-10-CM | POA: Diagnosis not present

## 2018-01-22 DIAGNOSIS — L570 Actinic keratosis: Secondary | ICD-10-CM | POA: Diagnosis not present

## 2018-01-22 DIAGNOSIS — L308 Other specified dermatitis: Secondary | ICD-10-CM | POA: Diagnosis not present

## 2018-01-31 ENCOUNTER — Other Ambulatory Visit: Payer: Self-pay | Admitting: Family Medicine

## 2018-02-05 DIAGNOSIS — E1165 Type 2 diabetes mellitus with hyperglycemia: Secondary | ICD-10-CM | POA: Diagnosis not present

## 2018-02-05 DIAGNOSIS — E782 Mixed hyperlipidemia: Secondary | ICD-10-CM | POA: Diagnosis not present

## 2018-02-05 DIAGNOSIS — Z794 Long term (current) use of insulin: Secondary | ICD-10-CM | POA: Diagnosis not present

## 2018-02-09 DIAGNOSIS — L308 Other specified dermatitis: Secondary | ICD-10-CM | POA: Diagnosis not present

## 2018-02-13 DIAGNOSIS — E1165 Type 2 diabetes mellitus with hyperglycemia: Secondary | ICD-10-CM | POA: Diagnosis not present

## 2018-02-13 DIAGNOSIS — Z125 Encounter for screening for malignant neoplasm of prostate: Secondary | ICD-10-CM | POA: Diagnosis not present

## 2018-02-13 DIAGNOSIS — Z794 Long term (current) use of insulin: Secondary | ICD-10-CM | POA: Diagnosis not present

## 2018-02-13 DIAGNOSIS — E782 Mixed hyperlipidemia: Secondary | ICD-10-CM | POA: Diagnosis not present

## 2018-04-19 DIAGNOSIS — L309 Dermatitis, unspecified: Secondary | ICD-10-CM | POA: Diagnosis not present

## 2018-04-30 DIAGNOSIS — E1165 Type 2 diabetes mellitus with hyperglycemia: Secondary | ICD-10-CM | POA: Diagnosis not present

## 2018-05-07 ENCOUNTER — Encounter: Payer: Self-pay | Admitting: Family Medicine

## 2018-05-07 ENCOUNTER — Ambulatory Visit (INDEPENDENT_AMBULATORY_CARE_PROVIDER_SITE_OTHER): Payer: Medicare Other | Admitting: Family Medicine

## 2018-05-07 VITALS — BP 103/65 | HR 103 | Temp 97.6°F | Ht 71.0 in | Wt 175.8 lb

## 2018-05-07 DIAGNOSIS — N4889 Other specified disorders of penis: Secondary | ICD-10-CM | POA: Diagnosis not present

## 2018-05-07 DIAGNOSIS — E1159 Type 2 diabetes mellitus with other circulatory complications: Secondary | ICD-10-CM | POA: Diagnosis not present

## 2018-05-07 DIAGNOSIS — I10 Essential (primary) hypertension: Secondary | ICD-10-CM

## 2018-05-07 DIAGNOSIS — N401 Enlarged prostate with lower urinary tract symptoms: Secondary | ICD-10-CM | POA: Diagnosis not present

## 2018-05-07 DIAGNOSIS — J329 Chronic sinusitis, unspecified: Secondary | ICD-10-CM | POA: Diagnosis not present

## 2018-05-07 DIAGNOSIS — I152 Hypertension secondary to endocrine disorders: Secondary | ICD-10-CM

## 2018-05-07 MED ORDER — IPRATROPIUM BROMIDE 0.06 % NA SOLN: 2.0000 | Freq: Four times a day (QID) | NASAL | 0 refills | Status: DC

## 2018-05-07 MED ORDER — AMOXICILLIN-POT CLAVULANATE 875-125 MG PO TABS: 1.0000 | ORAL_TABLET | Freq: Two times a day (BID) | ORAL | 0 refills | Status: DC

## 2018-05-07 NOTE — Assessment & Plan Note (Signed)
Increase Flomax to 0.8 mg daily.  Discussed that he may have worsening dizziness or headaches with this.  Patient voiced understanding.  Discussed reasons to return to care.

## 2018-05-07 NOTE — Progress Notes (Signed)
   Chief Complaint:  Scott Herman is a 85 y.o. male who presents for same day appointment with a chief complaint of sinus congestion.   Assessment/Plan:  Sinusitis Likely secondary to viral URI. No signs of bacterial infection. Start atrovent for rhinorrhea/sinus congestion.  Sent in a "pocket prescription" for augmentin with strict instruction to not start unless symptoms worsen or fail to improve within the next several days. Recommended tylenol and/or motrin as needed for low grade fever and pain. Encouraged good oral hydration. Return precautions reviewed. Follow up as needed.   Penile irritation Continue management plan per dermatology.  Recommended use of zinc oxide as a barrier protection.  Hypertension associated with diabetes ([place]) Low blood pressure today.  Typically in the 120s to 130s.  Not currently having any symptoms though may be a little dehydrated.  Encouraged good oral hydration.  He is having quite a bit of issue with frequent urination.  He will increase his Flomax to 0.8mg  daily-see below.  Discussed potential for blood pressure drop discussed potential side effects including dizziness lightheadedness.  We will continue amlodipine 5 mg daily for the time being.  BPH (benign prostatic hyperplasia) Increase Flomax to 0.8 mg daily.  Discussed that he may have worsening dizziness or headaches with this.  Patient voiced understanding.  Discussed reasons to return to care.     Subjective:  HPI:  Sinus Congestion, acute problem Started 5 days ago. Associated with postnasal drainage, sneeze,nose bleed, and cough.  Tried taking Mucinex and Benadryl which have not helped.  No fever chills.  No known sick contacts.  No other obvious alleviating or aggravating factors.   His chronic medical conditions are outlined below:   # BPH -Follow-up with urology for this. - On Flomax 0.4mg  daily  # Essential Hypertension -On amlodipine 5 mg daily.  Tolerating well. - ROS: No  reported chest pain or shortness of breath.  # Penile irritation -Has seen dermatology and urology in the past for this. -Symptoms are currently manageable with using combination of creams prescribed by his dermatologist.  ROS: Per HPI  PMH: He reports that he has never smoked. He has never used smokeless tobacco. He reports that he does not drink alcohol or use drugs.      Objective:  Physical Exam: BP 103/65 (BP Location: Left Arm, Patient Position: Sitting, Cuff Size: Normal)   Pulse (!) 103   Temp 97.6 F (36.4 C) (Oral)   Ht 5\' 11"  (1.803 m)   Wt 175 lb 12 oz (79.7 kg)   SpO2 96%   BMI 24.51 kg/m   Gen: NAD, resting comfortably HEENT: TMs clear.  OP erythematous with no exudate.  Nasal mucosa irritated with scattered dried blood and clear discharge. CV: Regular rate and rhythm with no murmurs appreciated Pulm: Normal work of breathing, clear to auscultation bilaterally with no crackles, wheezes, or rhonchi     Scott Herman M. [name], MD 05/07/2018 2:43 PM

## 2018-05-07 NOTE — Assessment & Plan Note (Signed)
Continue management plan per dermatology.  Recommended use of zinc oxide as a barrier protection.

## 2018-05-07 NOTE — Assessment & Plan Note (Signed)
Low blood pressure today.  Typically in the 120s to 130s.  Not currently having any symptoms though may be a little dehydrated.  Encouraged good oral hydration.  He is having quite a bit of issue with frequent urination.  He will increase his Flomax to 0.8mg  daily-see below.  Discussed potential for blood pressure drop discussed potential side effects including dizziness lightheadedness.  We will continue amlodipine 5 mg daily for the time being.

## 2018-05-10 ENCOUNTER — Other Ambulatory Visit: Payer: Self-pay | Admitting: Family Medicine

## 2018-05-10 MED ORDER — NYSTATIN-TRIAMCINOLONE 100000-0.1 UNIT/GM-% EX OINT: 1.0000 "application " | TOPICAL_OINTMENT | Freq: Two times a day (BID) | CUTANEOUS | 0 refills | Status: DC

## 2018-05-10 MED ORDER — TRIAMCINOLONE ACETONIDE 0.1 % EX CREA: 1.0000 "application " | TOPICAL_CREAM | Freq: Two times a day (BID) | CUTANEOUS | 0 refills | Status: DC

## 2018-05-14 DIAGNOSIS — E1165 Type 2 diabetes mellitus with hyperglycemia: Secondary | ICD-10-CM | POA: Diagnosis not present

## 2018-05-14 DIAGNOSIS — Z794 Long term (current) use of insulin: Secondary | ICD-10-CM | POA: Diagnosis not present

## 2018-05-14 DIAGNOSIS — E782 Mixed hyperlipidemia: Secondary | ICD-10-CM | POA: Diagnosis not present

## 2018-05-16 ENCOUNTER — Telehealth: Payer: Self-pay | Admitting: Family Medicine

## 2018-05-16 ENCOUNTER — Other Ambulatory Visit: Payer: Self-pay

## 2018-05-16 MED ORDER — NYSTATIN 100000 UNIT/GM EX OINT: 1.0000 "application " | TOPICAL_OINTMENT | Freq: Two times a day (BID) | CUTANEOUS | 0 refills | Status: DC

## 2018-05-16 NOTE — Telephone Encounter (Signed)
See note

## 2018-05-16 NOTE — Telephone Encounter (Signed)
Rx sent to pharmacy   

## 2018-05-16 NOTE — Telephone Encounter (Signed)
Copied from [place] [id number]. Topic: Quick Communication - Rx Refill/Question >> May 16, 2018  9:01 AM Alanda Slim E wrote: Medication: nystatin-triamcinolone ointment (MYCOLOG) - this was called in for Pt but he wants the Nystatin medication only not the combination. triamcinolone cream (KENALOG) 0.1 was also called in so the patient needs to have the non combination of Nystatin called if/ please advise   Has the patient contacted their pharmacy? Yes - sent communication to office   Preferred Pharmacy (with phone number or street name): [place], [place] - [address] [phone] (Phone) [phone] (Fax)    Agent: Please be advised that RX refills may take up to 3 business days. We ask that you follow-up with your pharmacy.

## 2018-05-24 DIAGNOSIS — Z794 Long term (current) use of insulin: Secondary | ICD-10-CM | POA: Diagnosis not present

## 2018-05-24 DIAGNOSIS — E782 Mixed hyperlipidemia: Secondary | ICD-10-CM | POA: Diagnosis not present

## 2018-05-24 DIAGNOSIS — Z125 Encounter for screening for malignant neoplasm of prostate: Secondary | ICD-10-CM | POA: Diagnosis not present

## 2018-05-24 DIAGNOSIS — E119 Type 2 diabetes mellitus without complications: Secondary | ICD-10-CM | POA: Diagnosis not present

## 2018-05-28 ENCOUNTER — Encounter: Payer: Self-pay | Admitting: Family Medicine

## 2018-05-30 ENCOUNTER — Ambulatory Visit (INDEPENDENT_AMBULATORY_CARE_PROVIDER_SITE_OTHER): Payer: Medicare Other | Admitting: Family Medicine

## 2018-05-30 ENCOUNTER — Encounter: Payer: Self-pay | Admitting: Family Medicine

## 2018-05-30 ENCOUNTER — Other Ambulatory Visit: Payer: Self-pay

## 2018-05-30 VITALS — BP 134/71 | Ht 71.0 in | Wt 177.0 lb

## 2018-05-30 DIAGNOSIS — I10 Essential (primary) hypertension: Secondary | ICD-10-CM

## 2018-05-30 DIAGNOSIS — I152 Hypertension secondary to endocrine disorders: Secondary | ICD-10-CM

## 2018-05-30 DIAGNOSIS — E1159 Type 2 diabetes mellitus with other circulatory complications: Secondary | ICD-10-CM | POA: Diagnosis not present

## 2018-05-30 MED ORDER — AMLODIPINE BESYLATE 5 MG PO TABS: 10.0000 mg | ORAL_TABLET | Freq: Every day | ORAL | 3 refills | Status: DC

## 2018-05-30 NOTE — Progress Notes (Signed)
    Chief Complaint:  Scott Herman is a 85 y.o. male who presents today for a virtual office visit with a chief complaint of essential hypertension.   Assessment/Plan:  Hypertension associated with diabetes ([place]) Uncontrolled.  We will increase Norvasc to 10 mg daily.  Encouraged patient to stay active and avoid salt.  He will be trying to ride his mountain bike more which should hopefully help as well.  He will continue monitoring his blood pressure at home with goal 140/90 or lower.  He will follow-up with me in a couple weeks if blood pressures are not at goal.  Will consider addition of losartan if blood pressure continues to be uncontrolled.    Subjective:  HPI:  Essential Hypertension - Chronic Problem - Currently on amlodipine 5 mg daily.  Tolerating well. -Home blood pressures ranging from 120s over 80s to 160s over 90s. -ROS: No reported chest pain or shortness of breath.   ROS: Per HPI  PMH: He reports that he has never smoked. He has never used smokeless tobacco. He reports that he does not drink alcohol or use drugs.      Objective/Observations  Physical Exam: BP 134/71 (BP Location: Left Arm, Patient Position: Sitting, Cuff Size: Normal)   Ht 5\' 11"  (1.803 m)   Wt 177 lb (80.3 kg)   BMI 24.69 kg/m  Gen: NAD, resting comfortably Pulm: Normal work of breathing Neuro: Grossly normal, moves all extremities Psych: Normal affect and thought content  Virtual Visit via Video   I connected with Scott Herman on 05/30/18 at 11:00 AM EDT by a video enabled telemedicine application and verified that I am speaking with the correct person using two identifiers. I discussed the limitations of evaluation and management by telemedicine and the availability of in person appointments. The patient expressed understanding and agreed to proceed.   Patient location: Home Provider location: [place] participating in the virtual visit: Myself and  patient     [name]. [name], MD 05/30/2018 11:20 AM

## 2018-05-30 NOTE — Assessment & Plan Note (Signed)
Uncontrolled.  We will increase Norvasc to 10 mg daily.  Encouraged patient to stay active and avoid salt.  He will be trying to ride his mountain bike more which should hopefully help as well.  He will continue monitoring his blood pressure at home with goal 140/90 or lower.  He will follow-up with me in a couple weeks if blood pressures are not at goal.  Will consider addition of losartan if blood pressure continues to be uncontrolled.

## 2018-07-03 ENCOUNTER — Other Ambulatory Visit: Payer: Self-pay

## 2018-07-03 ENCOUNTER — Encounter: Payer: Self-pay | Admitting: Family Medicine

## 2018-07-03 MED ORDER — TRIAMCINOLONE ACETONIDE 0.1 % EX OINT: 1.0000 "application " | TOPICAL_OINTMENT | Freq: Two times a day (BID) | CUTANEOUS | 0 refills | Status: DC

## 2018-07-24 ENCOUNTER — Other Ambulatory Visit: Payer: Self-pay

## 2018-07-24 ENCOUNTER — Ambulatory Visit (INDEPENDENT_AMBULATORY_CARE_PROVIDER_SITE_OTHER): Payer: Medicare Other | Admitting: Family Medicine

## 2018-07-24 ENCOUNTER — Encounter: Payer: Self-pay | Admitting: Family Medicine

## 2018-07-24 VITALS — BP 114/64 | HR 96 | Temp 98.3°F | Ht 71.0 in | Wt 177.0 lb

## 2018-07-24 DIAGNOSIS — M7989 Other specified soft tissue disorders: Secondary | ICD-10-CM | POA: Diagnosis not present

## 2018-07-24 DIAGNOSIS — I10 Essential (primary) hypertension: Secondary | ICD-10-CM | POA: Diagnosis not present

## 2018-07-24 DIAGNOSIS — I152 Hypertension secondary to endocrine disorders: Secondary | ICD-10-CM

## 2018-07-24 DIAGNOSIS — E1159 Type 2 diabetes mellitus with other circulatory complications: Secondary | ICD-10-CM | POA: Diagnosis not present

## 2018-07-24 LAB — COMPREHENSIVE METABOLIC PANEL
ALT: 16 U/L (ref 0–53)
AST: 17 U/L (ref 0–37)
Albumin: 3.8 g/dL (ref 3.5–5.2)
Alkaline Phosphatase: 76 U/L (ref 39–117)
BUN: 19 mg/dL (ref 6–23)
CO2: 28 mEq/L (ref 19–32)
Calcium: 9.3 mg/dL (ref 8.4–10.5)
Chloride: 104 mEq/L (ref 96–112)
Creatinine, Ser: 1.08 mg/dL (ref 0.40–1.50)
GFR: 65.26 mL/min (ref >60.00)
Glucose, Bld: 137 mg/dL — ABNORMAL HIGH (ref 70–99)
Potassium: 4.1 mEq/L (ref 3.5–5.1)
Sodium: 139 mEq/L (ref 135–145)
Total Bilirubin: 0.4 mg/dL (ref 0.2–1.2)
Total Protein: 6.3 g/dL (ref 6.0–8.3)

## 2018-07-24 LAB — CBC
HCT: 39.4 % (ref 39.0–52.0)
Hemoglobin: 13.5 g/dL (ref 13.0–17.0)
MCHC: 34.4 g/dL (ref 30.0–36.0)
MCV: 87.4 fl (ref 78.0–100.0)
Platelets: 280 10*3/uL (ref 150.0–400.0)
RBC: 4.51 Mil/uL (ref 4.22–5.81)
RDW: 13.8 % (ref 11.5–15.5)
WBC: 7.6 10*3/uL (ref 4.0–10.5)

## 2018-07-24 LAB — TSH: TSH: 1.56 u[IU]/mL (ref 0.35–4.50)

## 2018-07-24 NOTE — Progress Notes (Signed)
   Chief Complaint:  Scott Herman is a 85 y.o. male who presents for same day appointment with a chief complaint of leg swelling.   Assessment/Plan:  Leg Edema Likely medication side effect.  Will decrease dose of amlodipine to 2.5 mg twice daily.  Discussed importance of leg elevation and salt avoidance.  Check CBC, CMET, TSH, and d-dimer to rule out other possible etiologies. Discussed reasons to return to care and seek emergent care.  Hypertension associated with diabetes ([place]) He is likely having a reaction to higher dose amlodipine.  We will switch from 5 mg twice daily to 2.5 mg twice daily.  Advised him to keep an eye on his blood pressures and let us know if persistently 140/90 or higher.     Subjective:  HPI:  Leg Swelling, acute problem Started a few weeks ago. Located in right ankle and foot.  No obvious precipitating events.  No shortness of breath.  No chest pain.  No orthopnea or PND.  He has never had anything like this in the past.  No recent changes to diet or activity levels.  No pain.  No redness.  No treatments tried. No other obvious alleviating or aggravating factors.   # HTN Patient was seen about 6 weeks ago.  We increased his amlodipine to 5 mg twice daily due to uncontrolled blood pressure readings. He has had a few low readings since then. No dizziness. Lows in the 100s/60s. No reported chest pain or shortness of breath.   ROS: Per HPI  PMH: He reports that he has never smoked. He has never used smokeless tobacco. He reports that he does not drink alcohol or use drugs.      Objective:  Physical Exam: BP 114/64 (BP Location: Left Arm, Patient Position: Sitting, Cuff Size: Normal)   Pulse 96   Temp 98.3 F (36.8 C) (Oral)   Ht 5\' 11"  (1.803 m)   Wt 177 lb (80.3 kg)   SpO2 96%   BMI 24.69 kg/m   Gen: NAD, resting comfortably MSK: 2+ pitting edema to right midhin and 1+ pitting edema to left mid shin.  Neurovascular intact distally.     [name].  [name], MD 07/24/2018 1:47 PM

## 2018-07-24 NOTE — Patient Instructions (Signed)
It was very nice to see you today!  Please decrease your amlodipine to 2.5mg  twice daily.  Keep your legs elevated and avoid salt.  Let me know if not improving over the next week or so.  We will check blood work today.  Take care, Dr [name]

## 2018-07-24 NOTE — Assessment & Plan Note (Signed)
He is likely having a reaction to higher dose amlodipine.  We will switch from 5 mg twice daily to 2.5 mg twice daily.  Advised him to keep an eye on his blood pressures and let us know if persistently 140/90 or higher.

## 2018-07-25 LAB — D-DIMER, QUANTITATIVE: D-Dimer, Quant: 0.33 mcg/mL FEU (ref <0.50)

## 2018-07-25 NOTE — Progress Notes (Signed)
Please inform patient of the following:  Good news! Your blood work is NORMAL. You do not have a blood clot. I think your swelling was from the amlodipine. Please let me know if your symptoms are not improving with the dose change.  Scott Herman. [name], MD 07/25/2018 7:54 AM

## 2018-08-02 ENCOUNTER — Other Ambulatory Visit: Payer: Self-pay | Admitting: Family Medicine

## 2018-08-02 NOTE — Telephone Encounter (Signed)
Last OV 07/24/18 Last refill 05/16/18 #30 g/0 Next OV not scheduled

## 2018-08-20 DIAGNOSIS — E782 Mixed hyperlipidemia: Secondary | ICD-10-CM | POA: Diagnosis not present

## 2018-08-20 DIAGNOSIS — E1165 Type 2 diabetes mellitus with hyperglycemia: Secondary | ICD-10-CM | POA: Diagnosis not present

## 2018-08-20 DIAGNOSIS — Z794 Long term (current) use of insulin: Secondary | ICD-10-CM | POA: Diagnosis not present

## 2018-08-23 DIAGNOSIS — Z794 Long term (current) use of insulin: Secondary | ICD-10-CM | POA: Diagnosis not present

## 2018-08-23 DIAGNOSIS — Z125 Encounter for screening for malignant neoplasm of prostate: Secondary | ICD-10-CM | POA: Diagnosis not present

## 2018-08-23 DIAGNOSIS — E1165 Type 2 diabetes mellitus with hyperglycemia: Secondary | ICD-10-CM | POA: Diagnosis not present

## 2018-08-23 DIAGNOSIS — E782 Mixed hyperlipidemia: Secondary | ICD-10-CM | POA: Diagnosis not present

## 2018-09-11 ENCOUNTER — Other Ambulatory Visit: Payer: Self-pay

## 2018-09-11 ENCOUNTER — Ambulatory Visit (INDEPENDENT_AMBULATORY_CARE_PROVIDER_SITE_OTHER): Payer: Medicare Other | Admitting: Family Medicine

## 2018-09-11 ENCOUNTER — Encounter: Payer: Self-pay | Admitting: Family Medicine

## 2018-09-11 VITALS — BP 126/82 | HR 79 | Temp 98.2°F | Ht 71.0 in | Wt 173.2 lb

## 2018-09-11 DIAGNOSIS — E119 Type 2 diabetes mellitus without complications: Secondary | ICD-10-CM | POA: Diagnosis not present

## 2018-09-11 DIAGNOSIS — E1159 Type 2 diabetes mellitus with other circulatory complications: Secondary | ICD-10-CM | POA: Diagnosis not present

## 2018-09-11 DIAGNOSIS — I152 Hypertension secondary to endocrine disorders: Secondary | ICD-10-CM

## 2018-09-11 DIAGNOSIS — N4889 Other specified disorders of penis: Secondary | ICD-10-CM

## 2018-09-11 DIAGNOSIS — I10 Essential (primary) hypertension: Secondary | ICD-10-CM

## 2018-09-11 LAB — POCT URINALYSIS DIPSTICK
Bilirubin, UA: NEGATIVE
Blood, UA: NEGATIVE
Glucose, UA: NEGATIVE
Ketones, UA: NEGATIVE
Leukocytes, UA: NEGATIVE
Nitrite, UA: NEGATIVE
Protein, UA: NEGATIVE
Spec Grav, UA: 1.02 (ref 1.010–1.025)
Urobilinogen, UA: 0.2 E.U./dL
pH, UA: 6 (ref 5.0–8.0)

## 2018-09-11 MED ORDER — ITRACONAZOLE 200 MG PO TABS: 1.0000 | ORAL_TABLET | Freq: Every day | ORAL | 0 refills | Status: DC

## 2018-09-11 MED ORDER — METRONIDAZOLE 500 MG PO TABS: 500.0000 mg | ORAL_TABLET | Freq: Two times a day (BID) | ORAL | 0 refills | Status: AC

## 2018-09-11 NOTE — Assessment & Plan Note (Signed)
Consistent with balanitis.  He has tried and failed several topical creams.  We will start treatment with oral Flagyl and itraconazole for the next week.  If no improvement, would consider treatment for strep/staph infection with possible oral beta-lactam and topical mupirocin.  Recommended that he continue using zinc oxide.  Will avoid other creams at this point.  He will follow-up with me in a week.

## 2018-09-11 NOTE — Progress Notes (Signed)
   Chief Complaint:  Scott Herman is a 85 y.o. male who presents today with a chief complaint of penile irritation.   Assessment/Plan:  Penile irritation Consistent with balanitis.  He has tried and failed several topical creams.  We will start treatment with oral Flagyl and itraconazole for the next week.  If no improvement, would consider treatment for strep/staph infection with possible oral beta-lactam and topical mupirocin.  Recommended that he continue using zinc oxide.  Will avoid other creams at this point.  He will follow-up with me in a week.  Diabetes ([place]) Stable. Continue management per endocrinology.   Hypertension associated with diabetes ([place]) At goal.  He is having some lower extremity edema however is not particularly bothersome.  We will continue amlodipine 5 mg twice daily.     Subjective:  HPI:  # Penile Irritation This is been going on for several months.  He has been treated with several rounds of topical creams including tacrolimus, pimerolimus, ketoconazole, hydrocortisone, betamethasone, and triamcinolone.  Is also been treated with a dose of oral Diflucan with no improvement.  Still has significant bit of redness in his foreskin more predominantly on the left side of his foreskin.  Also some urethral irritation as well.  His stable, chronic medical conditions are outlined below:  # Essential Hypertension - On amlodipine 5mg  bid and tolerating well - ROS: No reported chest pain or shortness of breath.   # T2DM - Follows with encodrinology - On metformin 750mg  twice daily and actos 45mg  daily.  ROS: Per HPI  PMH: He reports that he has never smoked. He has never used smokeless tobacco. He reports that he does not drink alcohol or use drugs.      Objective:  Physical Exam: BP 126/82 (BP Location: Left Arm, Patient Position: Sitting, Cuff Size: Normal)   Pulse 79   Temp 98.2 F (36.8 C) (Oral)   Ht 5\' 11"  (1.803 m)   Wt 173 lb 3.2 oz (78.6 kg)    SpO2 95%   BMI 24.16 kg/m   Gen: NAD, resting comfortably GU: Lateral side of foreskin erythematous with mild erythema involving left urethral meatus as well.  No discharge.  No drainage.     [name]. [name], MD 09/11/2018 3:35 PM

## 2018-09-11 NOTE — Assessment & Plan Note (Signed)
Stable.  Continue management per endocrinology. 

## 2018-09-11 NOTE — Assessment & Plan Note (Signed)
At goal.  He is having some lower extremity edema however is not particularly bothersome.  We will continue amlodipine 5 mg twice daily.

## 2018-09-12 ENCOUNTER — Telehealth: Payer: Self-pay | Admitting: Family Medicine

## 2018-09-12 ENCOUNTER — Encounter: Payer: Self-pay | Admitting: Family Medicine

## 2018-09-12 NOTE — Telephone Encounter (Signed)
Medication Refill - Medication: Itraconazole 200 MG TABS/ Pt stated this medication costs over $300 and insurance will not cover it. Requesting alternative medication that will be covered  Has the patient contacted their pharmacy? Yes.   (Agent: If no, request that the patient contact the pharmacy for the refill.) (Agent: If yes, when and what did the pharmacy advise?)  Preferred Pharmacy (with phone number or street name):  [place], [place] - [address] [phone] (Phone) [phone] (Fax)     Agent: Please be advised that RX refills may take up to 3 business days. We ask that you follow-up with your pharmacy.

## 2018-09-13 ENCOUNTER — Other Ambulatory Visit: Payer: Self-pay

## 2018-09-13 LAB — URINE CULTURE
MICRO NUMBER:: 665903
Result:: NO GROWTH
SPECIMEN QUALITY:: ADEQUATE

## 2018-09-13 MED ORDER — ITRACONAZOLE 100 MG PO CAPS: 200.0000 mg | ORAL_CAPSULE | Freq: Every day | ORAL | 0 refills | Status: AC

## 2018-09-13 NOTE — Telephone Encounter (Signed)
PA approved.  Patient notified and will pick up the medication.

## 2018-09-13 NOTE — Telephone Encounter (Signed)
I can attempt PA on this medication to see if it will be covered.

## 2018-09-13 NOTE — Progress Notes (Signed)
Please inform patient of the following:  Urine culture is NEGATIVE. This means he has no signs of a UTI. Recommend continuing the medications we prescribed at let me know if not improving.  menopause

## 2018-09-18 ENCOUNTER — Encounter (INDEPENDENT_AMBULATORY_CARE_PROVIDER_SITE_OTHER): Payer: Medicare Other | Admitting: Ophthalmology

## 2018-09-18 ENCOUNTER — Other Ambulatory Visit: Payer: Self-pay

## 2018-09-18 DIAGNOSIS — H43813 Vitreous degeneration, bilateral: Secondary | ICD-10-CM | POA: Diagnosis not present

## 2018-09-18 DIAGNOSIS — E11319 Type 2 diabetes mellitus with unspecified diabetic retinopathy without macular edema: Secondary | ICD-10-CM | POA: Diagnosis not present

## 2018-09-18 DIAGNOSIS — E113293 Type 2 diabetes mellitus with mild nonproliferative diabetic retinopathy without macular edema, bilateral: Secondary | ICD-10-CM | POA: Diagnosis not present

## 2018-09-18 DIAGNOSIS — H353132 Nonexudative age-related macular degeneration, bilateral, intermediate dry stage: Secondary | ICD-10-CM

## 2018-09-18 LAB — HM DIABETES EYE EXAM

## 2018-10-02 ENCOUNTER — Other Ambulatory Visit: Payer: Self-pay

## 2018-10-02 ENCOUNTER — Encounter: Payer: Self-pay | Admitting: Family Medicine

## 2018-10-02 ENCOUNTER — Ambulatory Visit (INDEPENDENT_AMBULATORY_CARE_PROVIDER_SITE_OTHER): Payer: Medicare Other | Admitting: Family Medicine

## 2018-10-02 VITALS — BP 118/66 | HR 79 | Temp 97.7°F | Ht 71.0 in | Wt 175.2 lb

## 2018-10-02 DIAGNOSIS — N4889 Other specified disorders of penis: Secondary | ICD-10-CM

## 2018-10-02 MED ORDER — MUPIROCIN 2 % EX OINT: 1.0000 "application " | TOPICAL_OINTMENT | Freq: Two times a day (BID) | CUTANEOUS | 0 refills | Status: DC

## 2018-10-02 MED ORDER — AMOXICILLIN-POT CLAVULANATE 875-125 MG PO TABS: 1.0000 | ORAL_TABLET | Freq: Two times a day (BID) | ORAL | 0 refills | Status: DC

## 2018-10-02 NOTE — Progress Notes (Signed)
   Chief Complaint:  Scott Herman is a 85 y.o. male who presents today with a chief complaint of penile irritation.   Assessment/Plan:  Penile irritation Will cover for possible strep/staph infection with topical mupirocin and oral Augmentin for the next week.  If no improvement with this, may need biopsy.     Subjective:  HPI:  Penile Irritation Seen 3 weeks ago. Started on flagyl and itraconazole. No improvement.  At this point, has tried and failed several topical creams including tacrolimus, pimecrolimus, hydrocortisone, betamethasone, and triamcinolone.  Is also been treated with oral Diflucan.  Symptoms seem to be worse after showering.  Is not sexually active.  Does not share a bed with his wife.  ROS: Per HPI  PMH: He reports that he has never smoked. He has never used smokeless tobacco. He reports that he does not drink alcohol or use drugs.      Objective:  Physical Exam: BP 118/66 (BP Location: Left Arm, Patient Position: Sitting, Cuff Size: Normal)   Pulse 79   Temp 97.7 F (36.5 C) (Oral)   Ht 5\' 11"  (1.803 m)   Wt 175 lb 4 oz (79.5 kg)   SpO2 97%   BMI 24.44 kg/m   Gen: NAD, resting comfortably  Time Spent: I spent >15 minutes face-to-face with the patient, with more than half spent on counseling for management plan for his penile irritation.       Scott Herman. [name], MD 10/02/2018 9:03 AM

## 2018-10-02 NOTE — Assessment & Plan Note (Signed)
Will cover for possible strep/staph infection with topical mupirocin and oral Augmentin for the next week.  If no improvement with this, may need biopsy.

## 2018-11-01 DIAGNOSIS — M545 Low back pain, unspecified: Secondary | ICD-10-CM | POA: Insufficient documentation

## 2018-11-05 ENCOUNTER — Other Ambulatory Visit: Payer: Self-pay | Admitting: Family Medicine

## 2018-11-07 ENCOUNTER — Other Ambulatory Visit: Payer: Self-pay | Admitting: Family Medicine

## 2018-11-09 ENCOUNTER — Ambulatory Visit (INDEPENDENT_AMBULATORY_CARE_PROVIDER_SITE_OTHER): Payer: Medicare Other

## 2018-11-09 ENCOUNTER — Other Ambulatory Visit: Payer: Self-pay

## 2018-11-09 VITALS — BP 118/62 | Temp 99.0°F | Ht 71.0 in | Wt 175.0 lb

## 2018-11-09 DIAGNOSIS — Z23 Encounter for immunization: Secondary | ICD-10-CM

## 2018-11-09 DIAGNOSIS — Z0000 Encounter for general adult medical examination without abnormal findings: Secondary | ICD-10-CM

## 2018-11-09 NOTE — Progress Notes (Signed)
I have personally reviewed the Medicare Annual Wellness Visit and agree with the assessment and plan.  [name]. [name], MD 11/09/2018 2:51 PM

## 2018-11-09 NOTE — Patient Instructions (Signed)
Mr. Scott Herman , Thank you for taking time to come for your Medicare Wellness Visit. I appreciate your ongoing commitment to your health goals. Please review the following plan we discussed and let me know if I can assist you in the future.   Screening recommendations/referrals: Colorectal Screening: up to date; last 12/22/16  Vision and Dental Exams: Recommended annual ophthalmology exams for early detection of glaucoma and other disorders of the eye Recommended annual dental exams for proper oral hygiene  Diabetic Exams: Diabetic Eye Exam: yearly  Diabetic Foot Exam: yearly   Vaccinations:  Influenza vaccine: today  Pneumococcal vaccine: up to date; last 02/18/16 Tdap vaccine: Please call your insurance company to determine your out of pocket expense. You may also receive this vaccine at your local pharmacy or Health Dept. Shingles vaccine: Please call your insurance company to determine your out of pocket expense for the Shingrix vaccine. You may receive this vaccine at your local pharmacy.  Advanced directives: Please bring a copy of your POA (Power of Attorney) and/or Living Will to your next appointment.  Goals: Recommend to drink at least 6-8 8oz glasses of water per day and follow recommendations for diet based on diabetes.   Next appointment: Please schedule your Annual Wellness Visit with your Nurse Health Advisor in one year.  Preventive Care 83 Years and Older, Male Preventive care refers to lifestyle choices and visits with your health care provider that can promote health and wellness. What does preventive care include?  A yearly physical exam. This is also called an annual well check.  Dental exams once or twice a year.  Routine eye exams. Ask your health care provider how often you should have your eyes checked.  Personal lifestyle choices, including:  Daily care of your teeth and gums.  Regular physical activity.  Eating a healthy diet.  Avoiding tobacco and  drug use.  Limiting alcohol use.  Practicing safe sex.  Taking low doses of aspirin every day if recommended by your health care provider..  Taking vitamin and mineral supplements as recommended by your health care provider. What happens during an annual well check? The services and screenings done by your health care provider during your annual well check will depend on your age, overall health, lifestyle risk factors, and family history of disease. Counseling  Your health care provider may ask you questions about your:  Alcohol use.  Tobacco use.  Drug use.  Emotional well-being.  Home and relationship well-being.  Sexual activity.  Eating habits.  History of falls.  Memory and ability to understand (cognition).  Work and work Statistician. Screening  You may have the following tests or measurements:  Height, weight, and BMI.  Blood pressure.  Lipid and cholesterol levels. These may be checked every 5 years, or more frequently if you are over 6 years old.  Skin check.  Lung cancer screening. You may have this screening every year starting at age 19 if you have a 30-pack-year history of smoking and currently smoke or have quit within the past 15 years.  Fecal occult blood test (FOBT) of the stool. You may have this test every year starting at age 30.  Flexible sigmoidoscopy or colonoscopy. You may have a sigmoidoscopy every 5 years or a colonoscopy every 10 years starting at age 42.  Prostate cancer screening. Recommendations will vary depending on your family history and other risks.  Hepatitis C blood test.  Hepatitis B blood test.  Sexually transmitted disease (STD) testing.  Diabetes screening.  This is done by checking your blood sugar (glucose) after you have not eaten for a while (fasting). You may have this done every 1-3 years.  Abdominal aortic aneurysm (AAA) screening. You may need this if you are a current or former smoker.  Osteoporosis. You  may be screened starting at age 67 if you are at high risk. Talk with your health care provider about your test results, treatment options, and if necessary, the need for more tests. Vaccines  Your health care provider may recommend certain vaccines, such as:  Influenza vaccine. This is recommended every year.  Tetanus, diphtheria, and acellular pertussis (Tdap, Td) vaccine. You may need a Td booster every 10 years.  Zoster vaccine. You may need this after age 70.  Pneumococcal 13-valent conjugate (PCV13) vaccine. One dose is recommended after age 5.  Pneumococcal polysaccharide (PPSV23) vaccine. One dose is recommended after age 39. Talk to your health care provider about which screenings and vaccines you need and how often you need them. This information is not intended to replace advice given to you by your health care provider. Make sure you discuss any questions you have with your health care provider. Document Released: 03/13/2015 Document Revised: 11/04/2015 Document Reviewed: 12/16/2014 Elsevier Interactive Patient Education  2017 [place] Prevention in the Home Falls can cause injuries. They can happen to people of all ages. There are many things you can do to make your home safe and to help prevent falls. What can I do on the outside of my home?  Regularly fix the edges of walkways and driveways and fix any cracks.  Remove anything that might make you trip as you walk through a door, such as a raised step or threshold.  Trim any bushes or trees on the path to your home.  Use bright outdoor lighting.  Clear any walking paths of anything that might make someone trip, such as rocks or tools.  Regularly check to see if handrails are loose or broken. Make sure that both sides of any steps have handrails.  Any raised decks and porches should have guardrails on the edges.  Have any leaves, snow, or ice cleared regularly.  Use sand or salt on walking paths during  winter.  Clean up any spills in your garage right away. This includes oil or grease spills. What can I do in the bathroom?  Use night lights.  Install grab bars by the toilet and in the tub and shower. Do not use towel bars as grab bars.  Use non-skid mats or decals in the tub or shower.  If you need to sit down in the shower, use a plastic, non-slip stool.  Keep the floor dry. Clean up any water that spills on the floor as soon as it happens.  Remove soap buildup in the tub or shower regularly.  Attach bath mats securely with double-sided non-slip rug tape.  Do not have throw rugs and other things on the floor that can make you trip. What can I do in the bedroom?  Use night lights.  Make sure that you have a light by your bed that is easy to reach.  Do not use any sheets or blankets that are too big for your bed. They should not hang down onto the floor.  Have a firm chair that has side arms. You can use this for support while you get dressed.  Do not have throw rugs and other things on the floor that can make you  trip. What can I do in the kitchen?  Clean up any spills right away.  Avoid walking on wet floors.  Keep items that you use a lot in easy-to-reach places.  If you need to reach something above you, use a strong step stool that has a grab bar.  Keep electrical cords out of the way.  Do not use floor polish or wax that makes floors slippery. If you must use wax, use non-skid floor wax.  Do not have throw rugs and other things on the floor that can make you trip. What can I do with my stairs?  Do not leave any items on the stairs.  Make sure that there are handrails on both sides of the stairs and use them. Fix handrails that are broken or loose. Make sure that handrails are as long as the stairways.  Check any carpeting to make sure that it is firmly attached to the stairs. Fix any carpet that is loose or worn.  Avoid having throw rugs at the top or  bottom of the stairs. If you do have throw rugs, attach them to the floor with carpet tape.  Make sure that you have a light switch at the top of the stairs and the bottom of the stairs. If you do not have them, ask someone to add them for you. What else can I do to help prevent falls?  Wear shoes that:  Do not have high heels.  Have rubber bottoms.  Are comfortable and fit you well.  Are closed at the toe. Do not wear sandals.  If you use a stepladder:  Make sure that it is fully opened. Do not climb a closed stepladder.  Make sure that both sides of the stepladder are locked into place.  Ask someone to hold it for you, if possible.  Clearly mark and make sure that you can see:  Any grab bars or handrails.  First and last steps.  Where the edge of each step is.  Use tools that help you move around (mobility aids) if they are needed. These include:  Canes.  Walkers.  Scooters.  Crutches.  Turn on the lights when you go into a dark area. Replace any light bulbs as soon as they burn out.  Set up your furniture so you have a clear path. Avoid moving your furniture around.  If any of your floors are uneven, fix them.  If there are any pets around you, be aware of where they are.  Review your medicines with your doctor. Some medicines can make you feel dizzy. This can increase your chance of falling. Ask your doctor what other things that you can do to help prevent falls. This information is not intended to replace advice given to you by your health care provider. Make sure you discuss any questions you have with your health care provider. Document Released: 12/11/2008 Document Revised: 07/23/2015 Document Reviewed: 03/21/2014 Elsevier Interactive Patient Education  2017 [employer].

## 2018-11-09 NOTE — Progress Notes (Signed)
Subjective:   Scott Herman is a 85 y.o. male who presents for Medicare Annual/Subsequent preventive examination.  Review of Systems:   Cardiac Risk Factors include: advanced age (>76men, >66 women);diabetes mellitus;male gender;hypertension     Objective:    Vitals: BP 118/62 (BP Location: Left Arm, Patient Position: Sitting, Cuff Size: Normal)   Temp 99 F (37.2 C) (Temporal)   Ht 5\' 11"  (1.803 m)   Wt 175 lb (79.4 kg)   BMI 24.41 kg/m   Body mass index is 24.41 kg/m.  Advanced Directives 11/09/2018 10/27/2017 12/20/2016 02/18/2016 04/01/2011  Does Patient Have a Medical Advance Directive? Yes Yes No Yes Patient does not have advance directive  Type of Advance Directive Healthcare Power of [place];Living will -  Does patient want to make changes to medical advance directive? No - Patient declined No - Patient declined - No - Patient declined -  Copy of [place] in Chart? No - copy requested No - copy requested - No - copy requested -  Would patient like information on creating a medical advance directive? - - No - Patient declined - -  Pre-existing out of facility DNR order (yellow form or pink MOST form) - - - - No    Tobacco Social History   Tobacco Use  Smoking Status Never Smoker  Smokeless Tobacco Never Used     Counseling given: Not Answered   Clinical Intake:  Pre-visit preparation completed: Yes  Pain : No/denies pain   Diabetes: Yes CBG done?: No Did pt. bring in CBG monitor from home?: No  How often do you need to have someone help you when you read instructions, pamphlets, or other written materials from your doctor or pharmacy?: 1 - Never  Interpreter Needed?: No  Information entered by :: Denman George LPN  Past Medical History:  Diagnosis Date  . Abnormal stress electrocardiogram test Jan 2013   Negative cardiac catheterization.   . Diabetes mellitus, type 2  (HCC)    Dr Altheimer  . Diverticulosis   . GERD (gastroesophageal reflux disease)   . Hiatal hernia   . Hyperlipidemia   . Hypertension   . Normal cardiac stress test Jan 2009   Normal ETT  . Palpitations    Negative holter in 2009 except for tachycardia with bicycling   Past Surgical History:  Procedure Laterality Date  . CARDIAC CATHETERIZATION  Feb 2013   Mild nonobstructive CAD. Normal LV function  . CHOLECYSTECTOMY    . COLONOSCOPY     Dr Fuller Plan  . COLONOSCOPY WITH PROPOFOL N/A 12/22/2016   Procedure: COLONOSCOPY WITH PROPOFOL;  Surgeon: Gatha Mayer, MD;  Location: [facility];  Service: Endoscopy;  Laterality: N/A;  . HEMORRHOID SURGERY    . INGUINAL HERNIA REPAIR    . ORIF CLAVICULAR FRACTURE     left  . TONSILLECTOMY     Family History  Problem Relation Age of Onset  . Prostate cancer Father   . Diabetes Father   . Diverticulosis Father   . Hypertension Brother   . Diabetes Brother   . Prostate cancer Paternal Grandfather   . Diabetes Paternal Grandmother   . Colon cancer Neg Hx    Social History   Socioeconomic History  . Marital status: Married    Spouse name: Not on file  . Number of children: Not on file  . Years of education: Not on file  . Highest education level:  Not on file  Occupational History  . Occupation: Retired  Scientific laboratory technician  . Financial resource strain: Not on file  . Food insecurity    Worry: Not on file    Inability: Not on file  . Transportation needs    Medical: Not on file    Non-medical: Not on file  Tobacco Use  . Smoking status: Never Smoker  . Smokeless tobacco: Never Used  Substance and Sexual Activity  . Alcohol use: No    Alcohol/week: 0.0 standard drinks  . Drug use: No  . Sexual activity: Yes  Lifestyle  . Physical activity    Days per week: Not on file    Minutes per session: Not on file  . Stress: Not on file  Relationships  . Social Herbalist on phone: Not on file    Gets together: Not on  file    Attends religious service: Not on file    Active member of club or organization: Not on file    Attends meetings of clubs or organizations: Not on file    Relationship status: Not on file  Other Topics Concern  . Not on file  Social History Narrative  . Not on file    Outpatient Encounter Medications as of 11/09/2018  Medication Sig  . acetaminophen (TYLENOL) 500 MG tablet Take 1,000 mg by mouth daily as needed for moderate pain.  [name] Kitchen amLODipine (NORVASC) 5 MG tablet TAKE 1 TABLET BY MOUTH DAILY GENERIC EQUIVALENT FOR NORVASC  . amoxicillin-clavulanate (AUGMENTIN) 875-125 MG tablet Take 1 tablet by mouth 2 (two) times daily.  . Ascorbic Acid (VITAMIN C) 1000 MG tablet Take 1,000 mg by mouth daily.  [name] Kitchen aspirin 81 MG tablet Take 1 tablet (81 mg total) by mouth at bedtime.  [name] Kitchen CINNAMON PO Take 2,000 mg by mouth at bedtime.  [name] Kitchen co-enzyme Q-10 30 MG capsule Take 30 mg by mouth daily.    [name] Kitchen esomeprazole (NEXIUM) 40 MG capsule Take 1 capsule (40 mg total) by mouth daily before breakfast. (Patient taking differently: Take 20 mg by mouth daily before breakfast. )  . Insulin Glargine (TOUJEO SOLOSTAR ) Inject 24 Units into the skin daily.   [name] Kitchen ipratropium (ATROVENT) 0.06 % nasal spray Place 2 sprays into both nostrils 4 (four) times daily.  . metFORMIN (GLUCOPHAGE-XR) 750 MG 24 hr tablet Take 750 mg by mouth 2 (two) times daily.    . Multiple Vitamins-Minerals (MENS 50+ MULTI VITAMIN/MIN) TABS Take 1 tablet by mouth daily.  . mupirocin ointment (BACTROBAN) 2 % Apply 1 application topically 2 (two) times daily.  [name] Kitchen nystatin ointment (MYCOSTATIN) APPLY  OINTMENT TOPICALLY TO AFFECTED AREA TWICE DAILY  . nystatin-triamcinolone ointment (MYCOLOG) Apply 1 application topically 2 (two) times daily.  . pioglitazone (ACTOS) 45 MG tablet Take 45 mg by mouth daily.  . pravastatin (PRAVACHOL) 80 MG tablet Take 80 mg by mouth at bedtime.   . Tamsulosin HCl (FLOMAX) 0.4 MG CAPS Take 0.4 mg by mouth daily.     [name] Kitchen triamcinolone ointment (KENALOG) 0.1 % APPLY 1 APPLICATION TOPICALLY 2 TIMES DAILY   No facility-administered encounter medications on file as of 11/09/2018.     Activities of Daily Living In your present state of health, do you have any difficulty performing the following activities: 11/09/2018  Hearing? N  Vision? N  Difficulty concentrating or making decisions? N  Walking or climbing stairs? N  Dressing or bathing? N  Doing errands, shopping? N  Preparing Food and eating ?  N  Using the Toilet? N  In the past six months, have you accidently leaked urine? N  Do you have problems with loss of bowel control? N  Managing your Medications? N  Managing your Finances? N  Housekeeping or managing your Housekeeping? N  Some recent data might be hidden    Patient Care Team: Vivi Barrack, MD as PCP - General (Family Medicine) Festus Aloe, MD as Consulting Physician (Urology) Minus Breeding, MD as Consulting Physician (Cardiology) Ladene Artist, MD as Consulting Physician (Gastroenterology) Altheimer, Legrand Como, MD as Consulting Physician (Endocrinology) Hayden Pedro, MD as Consulting Physician (Ophthalmology) [facility], [facility] (Specialist)   Assessment:   This is a routine wellness examination for Scott Herman.  Exercise Activities and Dietary recommendations Current Exercise Habits: Home exercise routine, Time (Minutes): 60, Frequency (Times/Week): 3, Weekly Exercise (Minutes/Week): 180, Intensity: Mild  Goals    . Weight (lb) < 170 lb (77.1 kg)     Would like to lose 7 lbs by increasing activity.        Fall Risk Fall Risk  11/09/2018 10/27/2017 02/18/2016 05/12/2014 02/26/2013  Falls in the past year? 0 No No No No  Number falls in past yr: 0 - - - -  Injury with Fall? 0 - - - -  Follow up Education provided;Falls evaluation completed - - - -   Is the patient's home free of loose throw rugs in walkways, pet beds, electrical cords, etc?   yes      Grab bars in the  bathroom? yes      Handrails on the stairs?   yes      Adequate lighting?   yes  Timed Get Up and Go Performed: completed and within normal timeframe; no gait abnormalities noted    Depression Screen PHQ 2/9 Scores 11/09/2018 10/27/2017 02/18/2016 05/12/2014  PHQ - 2 Score 0 0 0 0    Cognitive Function- no cognitive concerns at this time      6CIT Screen 11/09/2018 10/27/2017  What Year? 0 points 0 points  What month? 0 points 0 points  What time? 0 points 0 points  Count back from 20 0 points 0 points  Months in reverse 0 points 0 points  Repeat phrase 0 points -  Total Score 0 -    Immunization History  Administered Date(s) Administered  . Fluad Quad(high Dose 65+) 11/09/2018  . Influenza, High Dose Seasonal PF 01/13/2014, 12/08/2017  . Influenza-Unspecified 12/08/2014, 01/04/2016, 11/28/2016  . Pneumococcal Conjugate-13 02/18/2016  . Zoster 02/26/2010    Qualifies for Shingles Vaccine? Discussed and patient will check with pharmacy for coverage.  Patient education handout provided    Screening Tests Health Maintenance  Topic Date Due  . TETANUS/TDAP  05/03/1952  . PNA vac Low Risk Adult (2 of 2 - PPSV23) 02/17/2017  . HEMOGLOBIN A1C  05/09/2018  . FOOT EXAM  05/17/2018  . OPHTHALMOLOGY EXAM  09/18/2019  . INFLUENZA VACCINE  Completed   Cancer Screenings: Lung: Low Dose CT Chest recommended if Age 83-80 years, 30 pack-year currently smoking OR have quit w/in 15years. Patient does not qualify. Colorectal: completed; last 12/22/16      Plan:    I have personally reviewed and addressed the Medicare Annual Wellness questionnaire and have noted the following in the patient's chart:  A. Medical and social history B. Use of alcohol, tobacco or illicit drugs  C. Current medications and supplements D. Functional ability and status E.  Nutritional status F.  Physical activity G.  Advance directives H. List of other physicians I.  Hospitalizations, surgeries, and ER  visits in previous 12 months J.  [place] such as hearing and vision if needed, cognitive and depression L. Referrals, records requested, and appointments- none   In addition, I have reviewed and discussed with patient certain preventive protocols, quality metrics, and best practice recommendations. A written personalized care plan for preventive services as well as general preventive health recommendations were provided to patient.   Signed,  Denman George, LPN  Nurse Health Advisor   Nurse Notes: no additional

## 2018-11-13 DIAGNOSIS — Z794 Long term (current) use of insulin: Secondary | ICD-10-CM | POA: Diagnosis not present

## 2018-11-13 DIAGNOSIS — E782 Mixed hyperlipidemia: Secondary | ICD-10-CM | POA: Diagnosis not present

## 2018-11-13 DIAGNOSIS — E1165 Type 2 diabetes mellitus with hyperglycemia: Secondary | ICD-10-CM | POA: Diagnosis not present

## 2018-11-13 LAB — LIPID PANEL
Cholesterol: 121 (ref 0–200)
HDL: 47 (ref 35–70)
LDL Cholesterol: 63
Triglycerides: 104 (ref 40–160)

## 2018-11-13 LAB — HEMOGLOBIN A1C: Hemoglobin A1C: 6.6

## 2018-11-13 LAB — BASIC METABOLIC PANEL
BUN: 18 (ref 4–21)
Creatinine: 0.8 (ref 0.6–1.3)
Glucose: 93
Potassium: 3.9 (ref 3.4–5.3)
Sodium: 140 (ref 137–147)

## 2018-11-13 LAB — HEPATIC FUNCTION PANEL
ALT: 15 (ref 10–40)
AST: 18 (ref 14–40)
Alkaline Phosphatase: 72 (ref 25–125)
Bilirubin, Total: 0.5

## 2018-11-19 DIAGNOSIS — R350 Frequency of micturition: Secondary | ICD-10-CM | POA: Diagnosis not present

## 2018-11-19 DIAGNOSIS — N476 Balanoposthitis: Secondary | ICD-10-CM | POA: Diagnosis not present

## 2018-11-19 DIAGNOSIS — N4342 Spermatocele of epididymis, multiple: Secondary | ICD-10-CM | POA: Diagnosis not present

## 2018-11-19 DIAGNOSIS — N401 Enlarged prostate with lower urinary tract symptoms: Secondary | ICD-10-CM | POA: Diagnosis not present

## 2018-11-22 DIAGNOSIS — E782 Mixed hyperlipidemia: Secondary | ICD-10-CM | POA: Diagnosis not present

## 2018-11-22 DIAGNOSIS — E1165 Type 2 diabetes mellitus with hyperglycemia: Secondary | ICD-10-CM | POA: Diagnosis not present

## 2018-11-22 DIAGNOSIS — Z794 Long term (current) use of insulin: Secondary | ICD-10-CM | POA: Diagnosis not present

## 2018-11-22 DIAGNOSIS — Z125 Encounter for screening for malignant neoplasm of prostate: Secondary | ICD-10-CM | POA: Diagnosis not present

## 2018-12-11 ENCOUNTER — Encounter: Payer: Self-pay | Admitting: Family Medicine

## 2019-01-21 ENCOUNTER — Other Ambulatory Visit: Payer: Self-pay

## 2019-01-22 ENCOUNTER — Encounter: Payer: Self-pay | Admitting: Family Medicine

## 2019-01-22 ENCOUNTER — Ambulatory Visit (INDEPENDENT_AMBULATORY_CARE_PROVIDER_SITE_OTHER): Payer: Medicare Other | Admitting: Family Medicine

## 2019-01-22 VITALS — BP 129/78 | HR 91 | Temp 98.1°F | Ht 71.0 in | Wt 173.2 lb

## 2019-01-22 DIAGNOSIS — N4889 Other specified disorders of penis: Secondary | ICD-10-CM | POA: Diagnosis not present

## 2019-01-22 DIAGNOSIS — R350 Frequency of micturition: Secondary | ICD-10-CM | POA: Diagnosis not present

## 2019-01-22 DIAGNOSIS — L570 Actinic keratosis: Secondary | ICD-10-CM | POA: Diagnosis not present

## 2019-01-22 DIAGNOSIS — R351 Nocturia: Secondary | ICD-10-CM

## 2019-01-22 MED ORDER — OXYMETAZOLINE HCL 1 % EX CREA: 1.0000 "application " | TOPICAL_CREAM | Freq: Every day | CUTANEOUS | 0 refills | Status: DC

## 2019-01-22 MED ORDER — DOXAZOSIN MESYLATE 2 MG PO TABS: 2.0000 mg | ORAL_TABLET | Freq: Every day | ORAL | 5 refills | Status: DC

## 2019-01-22 NOTE — Patient Instructions (Signed)
It was very nice to see you today!  STOP the flomax. Start the cardura.  Please try mixing the oxymetolazine ointment and use one daily.  Let me know how you are doing in a couple of weeks.   Take care, Dr [name]  Please try these tips to maintain a healthy lifestyle:   Eat at least 3 REAL meals and 1-2 snacks per day.  Aim for no more than 5 hours between eating.  If you eat breakfast, please do so within one hour of getting up.    Obtain twice as many fruits/vegetables as protein or carbohydrate foods for both lunch and dinner. (Half of each meal should be fruits/vegetables, one quarter protein, and one quarter starchy carbs)   Cut down on sweet beverages. This includes juice, soda, and sweet tea.    Exercise at least 150 minutes every week.

## 2019-01-22 NOTE — Assessment & Plan Note (Signed)
Due to BPH.  Start Flomax as it has not been effective.  Will start Cardura.  Discussed potential side effects.  Follow-up with me in a few weeks.

## 2019-01-22 NOTE — Assessment & Plan Note (Signed)
Patient has tried and failed several topical antibiotic ointments and corticosteroids.  We will try small amount of topical oxymetazoline in addition to zinc oxide.  If does not improve, would consider topical Brimonidine.

## 2019-01-22 NOTE — Progress Notes (Signed)
   Chief Complaint:  Scott Herman is a 85 y.o. male who presents today with a chief complaint of nocturia.   Assessment/Plan:  Nocturia Due to BPH.  Start Flomax as it has not been effective.  Will start Cardura.  Discussed potential side effects.  Follow-up with me in a few weeks.  Penile irritation Patient has tried and failed several topical antibiotic ointments and corticosteroids.  We will try small amount of topical oxymetazoline in addition to zinc oxide.  If does not improve, would consider topical Brimonidine.   Actinic Keratosis  Cryotherapy applied today.  See below procedure note.    Subjective:  HPI:  Nocturia Chronic problem.  On Flomax.  Symptoms seem to be worsening.  Flomax does not seem to help.  Gets up 2-3 times per night to urinate.  Has tried cutting out caffeine and evening fluid intake.  No dysuria.  No hematuria.  Penile irritation Chronic problem.  Has tried several creams in the past with no improvement.  Zinc oxide helps some.  Has significant erythema around foreskin whenever he showers or does anything that irritates the area.  Does not have any Herman.  No drainage.  No other obvious aggravating or alleviating factors.   Patient also has concern for several rough spots on forehead, bilateral ears, and arms.  He has seen dermatology in the past.  Lesions seem to be getting worse.  No treatments tried.  ROS: Per HPI  PMH: He reports that he has never smoked. He has never used smokeless tobacco. He reports that he does not drink alcohol or use drugs.      Objective:  Physical Exam: BP 129/78   Pulse 91   Temp 98.1 F (36.7 C)   Ht 5\' 11"  (1.803 m)   Wt 173 lb 3.2 oz (78.6 kg)   SpO2 96%   BMI 24.16 kg/m   Gen: NAD, resting comfortably CV: Regular rate and rhythm with no murmurs appreciated Pulm: Normal work of breathing, clear to auscultation bilaterally with no crackles, wheezes, or rhonchi GI: Normal bowel sounds present. Soft, Nontender,  Nondistended. MSK: No edema, cyanosis, or clubbing noted Skin: Warm, dry.  Rough, hyperkeratotic lesions noted on upper extremities, bilateral ears, and bilateral forehead Neuro: Grossly normal, moves all extremities Psych: Normal affect and thought content  Cryotherapy Procedure Note  Pre-operative Diagnosis: Actinic keratosis  Locations: Arms, ears, scalp  Indications: Therapeutic  Procedure Details  Patient informed of risks (permanent scarring, infection, light or dark discoloration, bleeding, infection, weakness, numbness and recurrence of the lesion) and benefits of the procedure and verbal informed consent obtained.  The areas are treated with liquid nitrogen therapy, frozen until ice ball extended 3 mm beyond lesion, allowed to thaw, and treated again. A total of 7 lesions were treated. The patient tolerated procedure well.  The patient was instructed on post-op care, warned that there may be blister formation, redness and Herman. Recommend OTC analgesia as needed for Herman.  Condition: Stable  Complications: none.         Scott Herman. Scott Pain, MD 01/22/2019 3:07 PM

## 2019-02-05 DIAGNOSIS — E1165 Type 2 diabetes mellitus with hyperglycemia: Secondary | ICD-10-CM | POA: Diagnosis not present

## 2019-02-05 DIAGNOSIS — E782 Mixed hyperlipidemia: Secondary | ICD-10-CM | POA: Diagnosis not present

## 2019-02-05 DIAGNOSIS — Z794 Long term (current) use of insulin: Secondary | ICD-10-CM | POA: Diagnosis not present

## 2019-02-10 ENCOUNTER — Encounter (HOSPITAL_COMMUNITY): Payer: Self-pay | Admitting: Emergency Medicine

## 2019-02-10 ENCOUNTER — Other Ambulatory Visit: Payer: Self-pay

## 2019-02-10 DIAGNOSIS — Z79899 Other long term (current) drug therapy: Secondary | ICD-10-CM | POA: Diagnosis not present

## 2019-02-10 DIAGNOSIS — Z794 Long term (current) use of insulin: Secondary | ICD-10-CM | POA: Insufficient documentation

## 2019-02-10 DIAGNOSIS — E119 Type 2 diabetes mellitus without complications: Secondary | ICD-10-CM | POA: Diagnosis not present

## 2019-02-10 DIAGNOSIS — Z7982 Long term (current) use of aspirin: Secondary | ICD-10-CM | POA: Diagnosis not present

## 2019-02-10 DIAGNOSIS — I10 Essential (primary) hypertension: Secondary | ICD-10-CM | POA: Diagnosis not present

## 2019-02-10 NOTE — ED Triage Notes (Signed)
Pt arriving via POV from home with concerns for HTN. Pt reports taking medications as prescribed. Pt also reports seeing slight edema on lower extremities.

## 2019-02-11 ENCOUNTER — Emergency Department (HOSPITAL_COMMUNITY)
Admission: EM | Admit: 2019-02-11 | Discharge: 2019-02-11 | Disposition: A | Discharge status: Home / Self Care | Payer: Medicare Other | Attending: Emergency Medicine | Admitting: Emergency Medicine

## 2019-02-11 ENCOUNTER — Ambulatory Visit (INDEPENDENT_AMBULATORY_CARE_PROVIDER_SITE_OTHER): Payer: Medicare Other | Admitting: Family Medicine

## 2019-02-11 ENCOUNTER — Encounter: Payer: Self-pay | Admitting: Family Medicine

## 2019-02-11 DIAGNOSIS — I10 Essential (primary) hypertension: Secondary | ICD-10-CM

## 2019-02-11 DIAGNOSIS — E1159 Type 2 diabetes mellitus with other circulatory complications: Secondary | ICD-10-CM

## 2019-02-11 DIAGNOSIS — I152 Hypertension secondary to endocrine disorders: Secondary | ICD-10-CM

## 2019-02-11 MED ORDER — HYDROCHLOROTHIAZIDE 12.5 MG PO TABS: 12.5000 mg | ORAL_TABLET | Freq: Every day | ORAL | 3 refills | Status: DC

## 2019-02-11 NOTE — ED Provider Notes (Signed)
[facility] DEPT Provider Note   CSN: [id number] Arrival date & time: 02/10/19  2219     History Chief Complaint  Patient presents with  . Hypertension    Scott Herman is a 85 y.o. male.  The history is provided by the patient.  Hypertension This is a chronic problem. The problem occurs constantly. The problem has been gradually worsening. Pertinent negatives include no chest pain, no headaches and no shortness of breath. Nothing aggravates the symptoms. Nothing relieves the symptoms.  Patient presents with elevated blood pressure.  He reports his blood pressure at home was 159/92 He reports he has not missed any medications.  He took a dose and a half of his nightly Norvasc without any improvement Patient denies any headache/weakness.  No chest pain or shortness of breath He otherwise feels well.     Past Medical History:  Diagnosis Date  . Abnormal stress electrocardiogram test Jan 2013   Negative cardiac catheterization.   . Diabetes mellitus, type 2 (HCC)    Dr Altheimer  . Diverticulosis   . GERD (gastroesophageal reflux disease)   . Hiatal hernia   . Hyperlipidemia   . Hypertension   . Normal cardiac stress test Jan 2009   Normal ETT  . Palpitations    Negative holter in 2009 except for tachycardia with bicycling    Patient Active Problem List   Diagnosis Date Noted  . Insomnia 08/28/2017  . Penile irritation 08/28/2017  . Benign neoplasm of sigmoid colon   . Lower GI bleed 12/20/2016  . BPH (benign prostatic hyperplasia) 01/12/2015  . Hypertension associated with diabetes ([place]) 06/26/2014  . Abnormal stress test 04/19/2011  . GERD 08/27/2009  . DIVERTICULOSIS-COLON 08/27/2009  . Diabetes ([place]) 05/28/2007  . Microscopic hematuria 05/28/2007  . Nocturia 05/28/2007  . Palpitations 02/09/2007    Past Surgical History:  Procedure Laterality Date  . CARDIAC CATHETERIZATION  Feb 2013   Mild nonobstructive CAD. Normal LV  function  . CHOLECYSTECTOMY    . COLONOSCOPY     Dr Fuller Plan  . COLONOSCOPY WITH PROPOFOL N/A 12/22/2016   Procedure: COLONOSCOPY WITH PROPOFOL;  Surgeon: Gatha Mayer, MD;  Location: [facility];  Service: Endoscopy;  Laterality: N/A;  . HEMORRHOID SURGERY    . INGUINAL HERNIA REPAIR    . ORIF CLAVICULAR FRACTURE     left  . TONSILLECTOMY         Family History  Problem Relation Age of Onset  . Prostate cancer Father   . Diabetes Father   . Diverticulosis Father   . Hypertension Brother   . Diabetes Brother   . Prostate cancer Paternal Grandfather   . Diabetes Paternal Grandmother   . Colon cancer Neg Hx     Social History   Tobacco Use  . Smoking status: Never Smoker  . Smokeless tobacco: Never Used  Substance Use Topics  . Alcohol use: No    Alcohol/week: 0.0 standard drinks  . Drug use: No    Home Medications Prior to Admission medications   Medication Sig Start Date End Date Taking? Authorizing Provider  acetaminophen (TYLENOL) 500 MG tablet Take 1,000 mg by mouth daily as needed for moderate pain.    [provider]  amLODipine (NORVASC) 5 MG tablet TAKE 1 TABLET BY MOUTH DAILY GENERIC EQUIVALENT FOR NORVASC 11/06/18   Vivi Barrack, MD  Ascorbic Acid (VITAMIN C) 1000 MG tablet Take 1,000 mg by mouth daily.    [provider]  aspirin 81 MG tablet Take 1 tablet (81 mg total) by mouth at bedtime. 12/29/16   Charlynne Cousins, MD  CINNAMON PO Take 2,000 mg by mouth at bedtime.    [provider]  co-enzyme Q-10 30 MG capsule Take 30 mg by mouth daily.      [provider]  doxazosin (CARDURA) 2 MG tablet Take 1 tablet (2 mg total) by mouth daily. 01/22/19   Vivi Barrack, MD  esomeprazole (NEXIUM) 40 MG capsule Take 1 capsule (40 mg total) by mouth daily before breakfast. Patient taking differently: Take 20 mg by mouth daily before breakfast.  04/25/12   Ladene Artist, MD  Insulin Glargine (TOUJEO SOLOSTAR [place]) Inject 24  Units into the skin daily.     [provider]  metFORMIN (GLUCOPHAGE-XR) 750 MG 24 hr tablet Take 750 mg by mouth 2 (two) times daily.      [provider]  Multiple Vitamins-Minerals (MENS 50+ MULTI VITAMIN/MIN) TABS Take 1 tablet by mouth daily.    [provider]  Oxymetazoline HCl 1 % CREA Apply 1 application topically daily. 01/22/19   Vivi Barrack, MD  pioglitazone (ACTOS) 45 MG tablet Take 45 mg by mouth daily.    [provider]  pravastatin (PRAVACHOL) 80 MG tablet Take 80 mg by mouth at bedtime.     [provider]    Allergies    Patient has no known allergies.  Review of Systems   Review of Systems  Constitutional: Negative for fever.  Respiratory: Negative for shortness of breath.   Cardiovascular: Negative for chest pain.  Neurological: Negative for weakness and headaches.  All other systems reviewed and are negative.   Physical Exam Updated Vital Signs BP (!) 161/89   Pulse (!) 106   Temp 98.6 F (37 C) (Oral)   Resp 20   Ht 1.803 m (5\' 11" )   Wt 78.9 kg   SpO2 98%   BMI 24.27 kg/m   Physical Exam CONSTITUTIONAL: Elderly, no acute distress, laughing HEAD: Normocephalic/atraumatic EYES: EOMI ENMT: Mucous membranes moist NECK: supple no meningeal signs CV: S1/S2 noted, no murmurs/rubs/gallops noted LUNGS: Lungs are clear to auscultation bilaterally, no apparent distress ABDOMEN: soft, nontender NEURO: Pt is awake/alert/appropriate, moves all extremitiesx4.  No facial droop.  No arm or leg drift EXTREMITIES: pulses normal/equal, full ROM SKIN: warm, color normal PSYCH: no abnormalities of mood noted, alert and oriented to situation  ED Results / Procedures / Treatments   Labs (all labs ordered are listed, but only abnormal results are displayed) Labs Reviewed - No data to display  EKG None  Radiology No results found.  Procedures Procedures Medications Ordered in ED Medications - No data to  display  ED Course  I have reviewed the triage vital signs and the nursing notes.      MDM Rules/Calculators/A&P                        Patient presents with asymptomatic hypertension.  He reports med compliance. Patient will call his PCP for follow-up.  No medication change at this time.  Patient agreed with plan Final Clinical Impression(s) / ED Diagnoses Final diagnoses:  Essential hypertension    Rx / DC Orders ED Discharge Orders    None       Ripley Fraise, MD 02/11/19 0145

## 2019-02-11 NOTE — Assessment & Plan Note (Addendum)
Chronic problem.  Not at goal.  We will switch off amlodipine due to lower extremity swelling and lack of adequate blood pressure control.  Start HCTZ 12.5 mg daily.  He will continue home monitoring with goal 140/90 or lower.  He will check in with me in 1 to 2 weeks.  Would consider addition of ACE/ARB depending on response to HCTZ.

## 2019-02-11 NOTE — ED Notes (Signed)
Triage RN Ria Comment aware of pt's last documented blood pressure of 148/92

## 2019-02-11 NOTE — Progress Notes (Signed)
   Chief Complaint:  Scott Herman is a 85 y.o. male who presents today for a virtual office visit with a chief complaint of elevated BP.   Assessment/Plan:  Hypertension associated with diabetes ([place]) Chronic problem.  Not at goal.  We will switch off amlodipine due to lower extremity swelling and lack of adequate blood pressure control.  Start HCTZ 12.5 mg daily.  He will continue home monitoring with goal 140/90 or lower.  He will check in with me in 1 to 2 weeks.  Would consider addition of ACE/ARB depending on response to HCTZ.    Subjective:  HPI:  Elevated BP  Patient found to have elevated BP yesterday of 159/92. Went to ED and was found to have BP of 161/89. Did not have any chest pain or shortness of breath.  Blood pressure this morning is in the 140s.  He has tried taking higher doses of amlodipine the past but is not tolerated due to lower extremity swelling.  ROS: Per HPI  PMH: He reports that he has never smoked. He has never used smokeless tobacco. He reports that he does not drink alcohol or use drugs.      Objective/Observations  Physical Exam: Gen: NAD, resting comfortably Pulm: Normal work of breathing Neuro: Grossly normal, moves all extremities Psych: Normal affect and thought content  No results found for this or any previous visit (from the past 24 hour(s)).   Virtual Visit via Video   I connected with [name] on 02/11/19 at 10:40 AM EST by a video enabled telemedicine application and verified that I am speaking with the correct person using two identifiers. The limitations of evaluation and management by telemedicine and the availability of in person appointments were discussed. The patient expressed understanding and agreed to proceed.   Patient location: Home Provider location: [place] participating in the virtual visit: Myself and Patient     [name]. [name], MD 02/11/2019 10:51 AM

## 2019-02-12 DIAGNOSIS — E1165 Type 2 diabetes mellitus with hyperglycemia: Secondary | ICD-10-CM | POA: Diagnosis not present

## 2019-02-12 DIAGNOSIS — Z79899 Other long term (current) drug therapy: Secondary | ICD-10-CM | POA: Diagnosis not present

## 2019-02-12 DIAGNOSIS — Z794 Long term (current) use of insulin: Secondary | ICD-10-CM | POA: Diagnosis not present

## 2019-02-12 DIAGNOSIS — E782 Mixed hyperlipidemia: Secondary | ICD-10-CM | POA: Diagnosis not present

## 2019-02-14 ENCOUNTER — Encounter: Payer: Self-pay | Admitting: Family Medicine

## 2019-02-16 ENCOUNTER — Other Ambulatory Visit: Payer: Self-pay

## 2019-02-16 ENCOUNTER — Encounter (HOSPITAL_COMMUNITY): Payer: Self-pay

## 2019-02-16 ENCOUNTER — Emergency Department (HOSPITAL_COMMUNITY)
Admission: EM | Admit: 2019-02-16 | Discharge: 2019-02-17 | Disposition: A | Discharge status: Home / Self Care | Payer: Medicare Other | Attending: Emergency Medicine | Admitting: Emergency Medicine

## 2019-02-16 DIAGNOSIS — Z79899 Other long term (current) drug therapy: Secondary | ICD-10-CM | POA: Diagnosis not present

## 2019-02-16 DIAGNOSIS — E119 Type 2 diabetes mellitus without complications: Secondary | ICD-10-CM | POA: Insufficient documentation

## 2019-02-16 DIAGNOSIS — Z794 Long term (current) use of insulin: Secondary | ICD-10-CM | POA: Diagnosis not present

## 2019-02-16 DIAGNOSIS — Z7982 Long term (current) use of aspirin: Secondary | ICD-10-CM | POA: Insufficient documentation

## 2019-02-16 DIAGNOSIS — I10 Essential (primary) hypertension: Secondary | ICD-10-CM | POA: Insufficient documentation

## 2019-02-16 NOTE — ED Notes (Signed)
Pt waiting in Car with Wife till room available: [phone]

## 2019-02-16 NOTE — ED Triage Notes (Signed)
Pt presents with c/o of HTN. Pt states he was seen here recently for BP issues. Pt states he took his BP at home as well as went to [employer] to use their machine to see if he was hypertensive. In triage pt BP is 135/92 (104) with HR of 101. Pt has no other complaints, denies pain, CP, SHOB or N/V.

## 2019-02-17 NOTE — ED Provider Notes (Signed)
[facility] DEPT Provider Note   CSN: [id number] Arrival date & time: 02/16/19  2152     History Chief Complaint  Patient presents with  . Hypertension    Scott Herman is a 85 y.o. male.  HPI     This is an 85 year old male with a history of hypertension, hyperlipidemia, diabetes who presents with high blood pressure.  Patient reports that he was seen and evaluated last week for the same.  He was started on HCTZ.  He has been taking that daily.  He actually got it increased by his primary physician.  He states he takes his blood pressure several times per day.  Today his blood pressure this morning was normal; however, tonight he took his blood pressure and it was in the [id number] systolic.  He rechecked and confirmed.  He also went to [facility] and noted that his systolic blood pressure was greater than 200.  This concerned him.  At no time today did he have any symptoms.  He completely denies shortness of breath, chest pain, headache, dizziness.  Denies any new medications.  Does report that he ate pizza for dinner.  Past Medical History:  Diagnosis Date  . Abnormal stress electrocardiogram test Jan 2013   Negative cardiac catheterization.   . Diabetes mellitus, type 2 (HCC)    Dr Altheimer  . Diverticulosis   . GERD (gastroesophageal reflux disease)   . Hiatal hernia   . Hyperlipidemia   . Hypertension   . Normal cardiac stress test Jan 2009   Normal ETT  . Palpitations    Negative holter in 2009 except for tachycardia with bicycling    Patient Active Problem List   Diagnosis Date Noted  . Insomnia 08/28/2017  . Penile irritation 08/28/2017  . Benign neoplasm of sigmoid colon   . Lower GI bleed 12/20/2016  . BPH (benign prostatic hyperplasia) 01/12/2015  . Hypertension associated with diabetes ([place]) 06/26/2014  . Abnormal stress test 04/19/2011  . GERD 08/27/2009  . DIVERTICULOSIS-COLON 08/27/2009  . Diabetes ([place]) 05/28/2007  .  Microscopic hematuria 05/28/2007  . Nocturia 05/28/2007  . Palpitations 02/09/2007    Past Surgical History:  Procedure Laterality Date  . CARDIAC CATHETERIZATION  Feb 2013   Mild nonobstructive CAD. Normal LV function  . CHOLECYSTECTOMY    . COLONOSCOPY     Dr Fuller Plan  . COLONOSCOPY WITH PROPOFOL N/A 12/22/2016   Procedure: COLONOSCOPY WITH PROPOFOL;  Surgeon: Gatha Mayer, MD;  Location: [facility];  Service: Endoscopy;  Laterality: N/A;  . HEMORRHOID SURGERY    . INGUINAL HERNIA REPAIR    . ORIF CLAVICULAR FRACTURE     left  . TONSILLECTOMY         Family History  Problem Relation Age of Onset  . Prostate cancer Father   . Diabetes Father   . Diverticulosis Father   . Hypertension Brother   . Diabetes Brother   . Prostate cancer Paternal Grandfather   . Diabetes Paternal Grandmother   . Colon cancer Neg Hx     Social History   Tobacco Use  . Smoking status: Never Smoker  . Smokeless tobacco: Never Used  Substance Use Topics  . Alcohol use: No    Alcohol/week: 0.0 standard drinks  . Drug use: No    Home Medications Prior to Admission medications   Medication Sig Start Date End Date Taking? Authorizing Provider  acetaminophen (TYLENOL) 650 MG CR tablet Take 1,300 mg by mouth every 8 (eight)  hours as needed for pain.   Yes [provider]  Ascorbic Acid (VITAMIN C) 1000 MG tablet Take 1,000 mg by mouth daily.   Yes [provider]  aspirin 81 MG tablet Take 1 tablet (81 mg total) by mouth at bedtime. 12/29/16  Yes Charlynne Cousins, MD  CINNAMON PO Take 2,000 mg by mouth at bedtime.   Yes [provider]  co-enzyme Q-10 30 MG capsule Take 30 mg by mouth daily.     Yes [provider]  esomeprazole (NEXIUM) 20 MG capsule Take 20 mg by mouth daily at 12 noon.   Yes [provider]  hydrochlorothiazide (HYDRODIURIL) 12.5 MG tablet Take 1 tablet (12.5 mg total) by mouth daily. Patient taking differently: Take 25 mg  by mouth daily.  02/11/19  Yes Vivi Barrack, MD  Insulin Glargine (TOUJEO SOLOSTAR [place]) Inject 24 Units into the skin every morning.    Yes [provider]  metFORMIN (GLUCOPHAGE-XR) 750 MG 24 hr tablet Take 750 mg by mouth 2 (two) times daily.     Yes [provider]  Multiple Vitamins-Minerals (MENS 50+ MULTI VITAMIN/MIN) TABS Take 1 tablet by mouth daily.   Yes [provider]  pioglitazone (ACTOS) 45 MG tablet Take 45 mg by mouth daily.   Yes [provider]  pravastatin (PRAVACHOL) 80 MG tablet Take 80 mg by mouth at bedtime.    Yes [provider]  doxazosin (CARDURA) 2 MG tablet Take 1 tablet (2 mg total) by mouth daily. Patient not taking: Reported on 02/16/2019 01/22/19   Vivi Barrack, MD  esomeprazole (NEXIUM) 40 MG capsule Take 1 capsule (40 mg total) by mouth daily before breakfast. Patient not taking: Reported on 02/16/2019 04/25/12   Ladene Artist, MD  Oxymetazoline HCl 1 % CREA Apply 1 application topically daily. Patient not taking: Reported on 02/16/2019 01/22/19   Vivi Barrack, MD    Allergies    Patient has no known allergies.  Review of Systems   Review of Systems  Constitutional: Negative for fever.  Respiratory: Negative for shortness of breath.   Cardiovascular: Negative for chest pain and leg swelling.  Gastrointestinal: Negative for abdominal pain, diarrhea, nausea and vomiting.  Neurological: Negative for dizziness, weakness and numbness.  All other systems reviewed and are negative.   Physical Exam Updated Vital Signs BP (!) 151/77   Pulse 91   Temp 97.8 F (36.6 C) (Oral)   Resp 18   SpO2 98%   Physical Exam Vitals and nursing note reviewed.  Constitutional:      Appearance: He is well-developed.     Comments: Elderly, nontoxic-appearing  HENT:     Head: Normocephalic and atraumatic.     Mouth/Throat:     Mouth: Mucous membranes are moist.  Eyes:     Pupils: Pupils are equal, round, and  reactive to light.  Cardiovascular:     Rate and Rhythm: Normal rate and regular rhythm.     Heart sounds: Normal heart sounds. No murmur.  Pulmonary:     Effort: Pulmonary effort is normal. No respiratory distress.     Breath sounds: Normal breath sounds. No wheezing.  Abdominal:     Palpations: Abdomen is soft.     Tenderness: There is no abdominal tenderness. There is no rebound.  Musculoskeletal:        General: No swelling.     Cervical back: Neck supple.  Skin:    General: Skin is warm and dry.  Neurological:     Mental Status: He is alert and oriented to person, place, and time.     Comments: 5 out of 5 strength in all 4 extremities, no dysmetria to finger-nose-finger, fluent speech  Psychiatric:        Mood and Affect: Mood normal.     ED Results / Procedures / Treatments   Labs (all labs ordered are listed, but only abnormal results are displayed) Labs Reviewed - No data to display  EKG EKG Interpretation  Date/Time:  Saturday February 16 2019 23:56:25 EST Ventricular Rate:  90 PR Interval:    QRS Duration: 95 QT Interval:  379 QTC Calculation: 464 R Axis:   -41 Text Interpretation: Sinus rhythm Probable left atrial enlargement Left axis deviation RSR' in V1 or V2, probably normal variant No significant change since last tracing Confirmed by Thayer Jew [phone]) on 02/16/2019 11:58:06 PM   Radiology No results found.  Procedures Procedures (including critical care time)  Medications Ordered in ED Medications - No data to display  ED Course  I have reviewed the triage vital signs and the nursing notes.  Pertinent labs & imaging results that were available during my care of the patient were reviewed by me and considered in my medical decision making (see chart for details).    MDM Rules/Calculators/A&P                       Patient reports from home with high blood pressure readings without symptoms.  He is otherwise asymptomatic and nontoxic.   Blood pressure at time of my evaluation was 151/77.  His exam is reassuring.  I had a long conversation with patient regarding blood pressure monitoring and symptom monitoring.  At this time he has no signs or symptoms of hypertensive urgency or emergency.  EKG is nonischemic.  I instructed the patient to continue to document his blood pressures 3 times daily.  As long as he is asymptomatic, he does not need to seek treatment for isolated high blood pressure readings.  However, I do encourage him to follow-up with his primary physician with his log for medication adjustment.  If he develops symptoms in the setting of high blood pressure, he was encouraged to seek treatment.  After history, exam, and medical workup I feel the patient has been appropriately medically screened and is safe for discharge home. Pertinent diagnoses were discussed with the patient. Patient was given return precautions.   Final Clinical Impression(s) / ED Diagnoses Final diagnoses:  Essential hypertension    Rx / DC Orders ED Discharge Orders    None       Karo Rog, Barbette Hair, MD 02/17/19 [phone]

## 2019-02-17 NOTE — Discharge Instructions (Addendum)
You were seen today for high blood pressure.  Your blood pressure readings here are reassuring.  Given that you are asymptomatic, you should continue to monitor 3 times per day.  If you are feeling well but your blood pressure readings remain high, you should follow-up with your primary physician for blood pressure medication adjustment.  If you develop any symptoms including headache, chest pain, shortness of breath, dizziness, that is when you should seek emergency treatment.

## 2019-02-18 ENCOUNTER — Ambulatory Visit: Payer: Medicare Other | Admitting: Family Medicine

## 2019-02-19 ENCOUNTER — Ambulatory Visit: Payer: Medicare Other | Admitting: Family Medicine

## 2019-02-19 ENCOUNTER — Other Ambulatory Visit: Payer: Self-pay

## 2019-02-20 ENCOUNTER — Ambulatory Visit (INDEPENDENT_AMBULATORY_CARE_PROVIDER_SITE_OTHER): Payer: Medicare Other | Admitting: Family Medicine

## 2019-02-20 ENCOUNTER — Encounter: Payer: Self-pay | Admitting: Family Medicine

## 2019-02-20 VITALS — BP 129/74 | HR 88 | Temp 98.6°F | Ht 71.0 in | Wt 171.2 lb

## 2019-02-20 DIAGNOSIS — E1159 Type 2 diabetes mellitus with other circulatory complications: Secondary | ICD-10-CM | POA: Diagnosis not present

## 2019-02-20 DIAGNOSIS — L570 Actinic keratosis: Secondary | ICD-10-CM | POA: Diagnosis not present

## 2019-02-20 DIAGNOSIS — N4889 Other specified disorders of penis: Secondary | ICD-10-CM

## 2019-02-20 DIAGNOSIS — N401 Enlarged prostate with lower urinary tract symptoms: Secondary | ICD-10-CM

## 2019-02-20 DIAGNOSIS — I152 Hypertension secondary to endocrine disorders: Secondary | ICD-10-CM

## 2019-02-20 DIAGNOSIS — I10 Essential (primary) hypertension: Secondary | ICD-10-CM

## 2019-02-20 NOTE — Assessment & Plan Note (Addendum)
Chronic problem. Stable. Not able to afford topical oxymetazoline.  We will continue zinc oxide.

## 2019-02-20 NOTE — Progress Notes (Signed)
Chief Complaint:  Scott Herman is a 85 y.o. male who presents today with a chief complaint of hypertension.   Assessment/Plan:  BPH (benign prostatic hyperplasia) Patient stopped Cardura.  Will restart at 4 mg daily.  Hopefully this will help some with his blood pressure as well.  Hypertension associated with diabetes ([place]) At goal today.  Discussed normal fluctuation in blood pressure.  Discussed importance of average blood pressure readings.  Discussed checking blood pressure at most 1 time daily if not having any symptoms.  Recommended checking first thing in the morning after sitting for at least 5 to 10 minutes.  We will continue HCTZ 12.5 mg daily for now.  He will follow-up with me in a few weeks if blood pressures continue to be elevated.  Will have some improvement of blood pressure with addition his Cardura as noted above.  Penile irritation Chronic problem. Stable. Not able to afford topical oxymetazoline.  We will continue zinc oxide.  Actinic Keratosis Chronic problem.  Cryotherapy applied today.  Tolerated well.  See below procedure note.    Subjective:  HPI:  Hypertension Patient went to ED 4 days ago with elevated blood pressure readings and had a 180s.  In the ED he was found to have blood pressure 151/77.  Did not have any symptoms at that time.  Had EKG which was nonischemic.  However last few days blood pressures have returned back into the 140s over 80s.  Does not have any reported chest pain or shortness of breath.  No clear reason for elevated blood pressure use.  He has been eating canned green beans few times per week.  Otherwise thinks he does well on salt intake.  He had a few skin lesions froze about a month ago.  He would like to have them refroze today.  He was also started on topical oxymetazoline for penile redness.  States that this cream was over $600 and he was not able to start it.  He has been using zinc oxide with minimal improvement.  ROS: Per  HPI  PMH: He reports that he has never smoked. He has never used smokeless tobacco. He reports that he does not drink alcohol or use drugs.      Objective:  Physical Exam: BP 129/74   Pulse 88   Temp 98.6 F (37 C)   Ht 5\' 11"  (1.803 m)   Wt 171 lb 4 oz (77.7 kg)   SpO2 96%   BMI 23.88 kg/m   Gen: NAD, resting comfortably CV: Regular rate and rhythm with no murmurs appreciated Pulm: Normal work of breathing, clear to auscultation bilaterally with no crackles, wheezes, or rhonchi GI: Normal bowel sounds present. Soft, Nontender, Nondistended. Skin: Hyperkeratotic lesions scattered across bilateral ears and right upper extremity.  Cryotherapy Procedure Note  Pre-operative Diagnosis: Actinic keratosis  Locations: Bilateral ears and right arm.  Indications: Therapeutic  Procedure Details  Patient informed of risks (permanent scarring, infection, light or dark discoloration, bleeding, infection, weakness, numbness and recurrence of the lesion) and benefits of the procedure and verbal informed consent obtained.  The areas are treated with liquid nitrogen therapy, frozen until ice ball extended 3 mm beyond lesion, allowed to thaw, and treated again.  A total of 3 lesions were treated.  The patient tolerated procedure well.  The patient was instructed on post-op care, warned that there may be blister formation, redness and pain. Recommend OTC analgesia as needed for pain.  Condition: Stable  Complications: none.  [name]. [name], MD 02/20/2019 10:31 AM

## 2019-02-20 NOTE — Assessment & Plan Note (Signed)
Patient stopped Cardura.  Will restart at 4 mg daily.  Hopefully this will help some with his blood pressure as well.

## 2019-02-20 NOTE — Assessment & Plan Note (Signed)
At goal today.  Discussed normal fluctuation in blood pressure.  Discussed importance of average blood pressure readings.  Discussed checking blood pressure at most 1 time daily if not having any symptoms.  Recommended checking first thing in the morning after sitting for at least 5 to 10 minutes.  We will continue HCTZ 12.5 mg daily for now.  He will follow-up with me in a few weeks if blood pressures continue to be elevated.  Will have some improvement of blood pressure with addition his Cardura as noted above.

## 2019-03-01 ENCOUNTER — Encounter: Payer: Self-pay | Admitting: Family Medicine

## 2019-03-05 DIAGNOSIS — H43813 Vitreous degeneration, bilateral: Secondary | ICD-10-CM | POA: Diagnosis not present

## 2019-03-05 DIAGNOSIS — E119 Type 2 diabetes mellitus without complications: Secondary | ICD-10-CM | POA: Diagnosis not present

## 2019-03-05 DIAGNOSIS — H353131 Nonexudative age-related macular degeneration, bilateral, early dry stage: Secondary | ICD-10-CM | POA: Diagnosis not present

## 2019-03-20 ENCOUNTER — Ambulatory Visit: Payer: Medicare Other | Attending: Internal Medicine

## 2019-03-20 DIAGNOSIS — Z23 Encounter for immunization: Secondary | ICD-10-CM | POA: Insufficient documentation

## 2019-03-20 NOTE — Progress Notes (Signed)
   Covid-19 Vaccination Clinic  Name:  Scott Herman    MRN: [id number] DOB: 01/23/1934  03/20/2019  [name] was observed post Covid-19 immunization for 15 minutes without incidence. He was provided with Vaccine Information Sheet and instruction to access the V-Safe system.   [name] was instructed to call 911 with any severe reactions post vaccine: [name] Kitchen Difficulty breathing  . Swelling of your face and throat  . A fast heartbeat  . A bad rash all over your body  . Dizziness and weakness    Immunizations Administered    Name Date Dose VIS Date Route   Pfizer COVID-19 Vaccine 03/20/2019  1:31 PM 0.3 mL 02/08/2019 Intramuscular   Manufacturer: [place]   Lot: [id number]   [place]: [id number]

## 2019-03-24 ENCOUNTER — Encounter: Payer: Self-pay | Admitting: Family Medicine

## 2019-03-25 ENCOUNTER — Other Ambulatory Visit: Payer: Self-pay

## 2019-03-25 MED ORDER — DOXAZOSIN MESYLATE 4 MG PO TABS: 4.0000 mg | ORAL_TABLET | Freq: Every day | ORAL | 3 refills | Status: DC

## 2019-04-10 ENCOUNTER — Ambulatory Visit: Payer: Medicare Other | Attending: Internal Medicine

## 2019-04-10 DIAGNOSIS — Z23 Encounter for immunization: Secondary | ICD-10-CM | POA: Insufficient documentation

## 2019-04-10 NOTE — Progress Notes (Signed)
   Covid-19 Vaccination Clinic  Name:  Scott Herman    MRN: [id number] DOB: 08-Feb-1934  04/10/2019  [name] was observed post Covid-19 immunization for 15 minutes without incidence. He was provided with Vaccine Information Sheet and instruction to access the V-Safe system.   [name] was instructed to call 911 with any severe reactions post vaccine: [name] Kitchen Difficulty breathing  . Swelling of your face and throat  . A fast heartbeat  . A bad rash all over your body  . Dizziness and weakness    Immunizations Administered    Name Date Dose VIS Date Route   Pfizer COVID-19 Vaccine 04/10/2019 12:42 PM 0.3 mL 02/08/2019 Intramuscular   Manufacturer: [place]   Lot: [id number]   [place]: [id number]

## 2019-04-25 ENCOUNTER — Encounter: Payer: Self-pay | Admitting: Family Medicine

## 2019-04-29 ENCOUNTER — Encounter: Payer: Self-pay | Admitting: Family Medicine

## 2019-04-29 ENCOUNTER — Ambulatory Visit (INDEPENDENT_AMBULATORY_CARE_PROVIDER_SITE_OTHER): Payer: Medicare Other | Admitting: Family Medicine

## 2019-04-29 ENCOUNTER — Other Ambulatory Visit: Payer: Self-pay

## 2019-04-29 VITALS — BP 107/67 | HR 86 | Temp 97.2°F | Ht 71.0 in | Wt 171.2 lb

## 2019-04-29 DIAGNOSIS — N401 Enlarged prostate with lower urinary tract symptoms: Secondary | ICD-10-CM

## 2019-04-29 DIAGNOSIS — E1159 Type 2 diabetes mellitus with other circulatory complications: Secondary | ICD-10-CM

## 2019-04-29 DIAGNOSIS — I10 Essential (primary) hypertension: Secondary | ICD-10-CM

## 2019-04-29 DIAGNOSIS — I152 Hypertension secondary to endocrine disorders: Secondary | ICD-10-CM

## 2019-04-29 NOTE — Progress Notes (Signed)
   Scott Herman is a 86 y.o. male who presents today for an office visit.  Assessment/Plan:  New/Acute Problems: Hand Pain Unclear etiology.  No current symptoms.  Possibly related to osteoarthritis.  Also possibility mild nerve pain.  Normal exam today.  Given that symptoms are improving and has normal exam, we will continue with watchful waiting for now.  Would consider referral to neurology if symptoms recur.  Chronic Problems Addressed Today: Hypertension associated with diabetes ([place]) At goal.  Continue HCTZ 12.5 mg daily.  BPH (benign prostatic hyperplasia) Stable.  Continue Cardura 4 mg daily.     Subjective:  HPI:  Patient woke up a few nights ago with throbbing sensation in all of his fingertips.  Lasted for a few hours and then subsided spontaneously.  He has had intermittent issues since then however much more mild.  Only experiences the pain when pressing fingertips against each other.  No reported weakness or numbness.  No neck pain or pressure        Objective:  Physical Exam: BP 107/67   Pulse 86   Temp (!) 97.2 F (36.2 C)   Ht 5\' 11"  (1.803 m)   Wt 171 lb 3.2 oz (77.7 kg)   SpO2 96%   BMI 23.88 kg/m   Wt Readings from Last 3 Encounters:  04/29/19 171 lb 3.2 oz (77.7 kg)  02/20/19 171 lb 4 oz (77.7 kg)  02/10/19 174 lb (78.9 kg)  Gen: No acute distress, resting comfortably CV: Regular rate and rhythm with no murmurs appreciated Pulm: Normal work of breathing, clear to auscultation bilaterally with no crackles, wheezes, or rhonchi MSK: Bilateral upper extremities without deformities.  Allen test normal bilaterally.  Tinel's sign and Phalen test normal bilaterally.  Radial pulses 2+ and symmetric bilaterally. Neuro: Grossly normal, moves all extremities Psych: Normal affect and thought content      Nazirah Tri M. [name], MD 04/29/2019 1:37 PM

## 2019-04-29 NOTE — Patient Instructions (Signed)
It was very nice to see you today!  Keep up the good work.  Let me know if your symptoms return or worsen.  Take care, Dr [name]  Please try these tips to maintain a healthy lifestyle:   Eat at least 3 REAL meals and 1-2 snacks per day.  Aim for no more than 5 hours between eating.  If you eat breakfast, please do so within one hour of getting up.    Each meal should contain half fruits/vegetables, one quarter protein, and one quarter carbs (no bigger than a computer mouse)   Cut down on sweet beverages. This includes juice, soda, and sweet tea.     Drink at least 1 glass of water with each meal and aim for at least 8 glasses per day   Exercise at least 150 minutes every week.

## 2019-04-29 NOTE — Assessment & Plan Note (Signed)
At goal.  Continue HCTZ 12.5 mg daily.

## 2019-04-29 NOTE — Assessment & Plan Note (Signed)
Stable.  Continue Cardura 4 mg daily.

## 2019-05-07 DIAGNOSIS — E1165 Type 2 diabetes mellitus with hyperglycemia: Secondary | ICD-10-CM | POA: Diagnosis not present

## 2019-05-07 DIAGNOSIS — Z79899 Other long term (current) drug therapy: Secondary | ICD-10-CM | POA: Diagnosis not present

## 2019-05-07 DIAGNOSIS — I10 Essential (primary) hypertension: Secondary | ICD-10-CM | POA: Diagnosis not present

## 2019-05-07 DIAGNOSIS — Z794 Long term (current) use of insulin: Secondary | ICD-10-CM | POA: Diagnosis not present

## 2019-05-13 DIAGNOSIS — I10 Essential (primary) hypertension: Secondary | ICD-10-CM | POA: Diagnosis not present

## 2019-05-13 DIAGNOSIS — E1165 Type 2 diabetes mellitus with hyperglycemia: Secondary | ICD-10-CM | POA: Diagnosis not present

## 2019-05-13 DIAGNOSIS — E782 Mixed hyperlipidemia: Secondary | ICD-10-CM | POA: Diagnosis not present

## 2019-05-13 DIAGNOSIS — Z794 Long term (current) use of insulin: Secondary | ICD-10-CM | POA: Diagnosis not present

## 2019-07-01 ENCOUNTER — Encounter: Payer: Self-pay | Admitting: Family Medicine

## 2019-07-01 ENCOUNTER — Ambulatory Visit (INDEPENDENT_AMBULATORY_CARE_PROVIDER_SITE_OTHER): Payer: Medicare Other | Admitting: Family Medicine

## 2019-07-01 ENCOUNTER — Other Ambulatory Visit: Payer: Self-pay

## 2019-07-01 VITALS — BP 110/62 | HR 87 | Temp 98.8°F | Ht 71.0 in | Wt 173.0 lb

## 2019-07-01 DIAGNOSIS — M7989 Other specified soft tissue disorders: Secondary | ICD-10-CM

## 2019-07-01 DIAGNOSIS — L570 Actinic keratosis: Secondary | ICD-10-CM | POA: Insufficient documentation

## 2019-07-01 NOTE — Assessment & Plan Note (Signed)
No red flags.  Likely side effect of pioglitazone.  Recommend he discuss with endocrinology about coming off this medication.  Discussed importance of salt avoidance, leg elevation, and frequent movement.

## 2019-07-01 NOTE — Assessment & Plan Note (Signed)
Cryotherapy applied today.  See below procedure note.  He tolerated well. 

## 2019-07-01 NOTE — Progress Notes (Signed)
   Scott Herman is a 86 y.o. male who presents today for an office visit.  Assessment/Plan:  Chronic Problems Addressed Today: Leg swelling No red flags.  Likely side effect of pioglitazone.  Recommend he discuss with endocrinology about coming off this medication.  Discussed importance of salt avoidance, leg elevation, and frequent movement.  Actinic keratosis Cryotherapy applied today.  See below procedure note.  He tolerated well.     Subjective:  HPI:  Patient here with concern for spot on ear and forehead. They have been there for months. Stable over that time.   He has also had ongoing issues with swelling in his lower legs.  Worse in his right leg.  He has been trying to watch his sodium intake.  No reported orthopnea.       Objective:  Physical Exam: BP 110/62 (BP Location: Left Arm, Patient Position: Sitting, Cuff Size: Normal)   Pulse 87   Temp 98.8 F (37.1 C) (Temporal)   Ht 5\' 11"  (1.803 m)   Wt 173 lb (78.5 kg)   SpO2 95%   BMI 24.13 kg/m   Wt Readings from Last 3 Encounters:  07/01/19 173 lb (78.5 kg)  04/29/19 171 lb 3.2 oz (77.7 kg)  02/20/19 171 lb 4 oz (77.7 kg)    Gen: No acute distress, resting comfortably CV: Regular rate and rhythm with no murmurs appreciated Pulm: Normal work of breathing, clear to auscultation bilaterally with no crackles, wheezes, or rhonchi MSK: Bilateral lower extremities with 2+ pitting edema. Neuro: Grossly normal, moves all extremities Skin: Hyperkeratotic lesions on posterior right ear and midline forehead. Psych: Normal affect and thought content  Cryotherapy Procedure Note  Pre-operative Diagnosis: Actinic keratosis  Locations: Right ear and forehead.  Indications: Therapeutic  Procedure Details  Patient informed of risks (permanent scarring, infection, light or dark discoloration, bleeding, infection, weakness, numbness and recurrence of the lesion) and benefits of the procedure and verbal informed consent  obtained.  The areas are treated with liquid nitrogen therapy, frozen until ice ball extended 3 mm beyond lesion, allowed to thaw, and treated again. The patient tolerated procedure well.  A total of 2 lesions were treated.  The patient was instructed on post-op care, warned that there may be blister formation, redness and pain. Recommend OTC analgesia as needed for pain.  Condition: Stable  Complications: none.        [name]. [name], MD 07/01/2019 10:49 AM

## 2019-07-01 NOTE — Patient Instructions (Signed)
It was very nice to see you today!  We froze a spot on your ear and your forehead today.  Please talk with your endocrinologist about stopping your pioglitazone.  Please let me know if your swelling does not improve with this.  Take care, Dr [name]  Please try these tips to maintain a healthy lifestyle:   Eat at least 3 REAL meals and 1-2 snacks per day.  Aim for no more than 5 hours between eating.  If you eat breakfast, please do so within one hour of getting up.    Each meal should contain half fruits/vegetables, one quarter protein, and one quarter carbs (no bigger than a computer mouse)   Cut down on sweet beverages. This includes juice, soda, and sweet tea.     Drink at least 1 glass of water with each meal and aim for at least 8 glasses per day   Exercise at least 150 minutes every week.

## 2019-07-23 ENCOUNTER — Other Ambulatory Visit: Payer: Self-pay | Admitting: Family Medicine

## 2019-08-12 DIAGNOSIS — E1165 Type 2 diabetes mellitus with hyperglycemia: Secondary | ICD-10-CM | POA: Diagnosis not present

## 2019-08-12 DIAGNOSIS — E782 Mixed hyperlipidemia: Secondary | ICD-10-CM | POA: Diagnosis not present

## 2019-08-12 DIAGNOSIS — Z794 Long term (current) use of insulin: Secondary | ICD-10-CM | POA: Diagnosis not present

## 2019-08-13 DIAGNOSIS — Z79899 Other long term (current) drug therapy: Secondary | ICD-10-CM | POA: Insufficient documentation

## 2019-08-14 DIAGNOSIS — I10 Essential (primary) hypertension: Secondary | ICD-10-CM | POA: Diagnosis not present

## 2019-08-14 DIAGNOSIS — Z794 Long term (current) use of insulin: Secondary | ICD-10-CM | POA: Diagnosis not present

## 2019-08-14 DIAGNOSIS — E1165 Type 2 diabetes mellitus with hyperglycemia: Secondary | ICD-10-CM | POA: Diagnosis not present

## 2019-08-14 DIAGNOSIS — E782 Mixed hyperlipidemia: Secondary | ICD-10-CM | POA: Diagnosis not present

## 2019-09-19 ENCOUNTER — Encounter (INDEPENDENT_AMBULATORY_CARE_PROVIDER_SITE_OTHER): Payer: Medicare Other | Admitting: Ophthalmology

## 2019-09-19 ENCOUNTER — Other Ambulatory Visit: Payer: Self-pay

## 2019-09-19 DIAGNOSIS — H43813 Vitreous degeneration, bilateral: Secondary | ICD-10-CM | POA: Diagnosis not present

## 2019-09-19 DIAGNOSIS — E11319 Type 2 diabetes mellitus with unspecified diabetic retinopathy without macular edema: Secondary | ICD-10-CM | POA: Diagnosis not present

## 2019-09-19 DIAGNOSIS — H353132 Nonexudative age-related macular degeneration, bilateral, intermediate dry stage: Secondary | ICD-10-CM

## 2019-09-19 DIAGNOSIS — E113293 Type 2 diabetes mellitus with mild nonproliferative diabetic retinopathy without macular edema, bilateral: Secondary | ICD-10-CM | POA: Diagnosis not present

## 2019-10-15 ENCOUNTER — Encounter: Payer: Self-pay | Admitting: Family Medicine

## 2019-10-23 DIAGNOSIS — H2513 Age-related nuclear cataract, bilateral: Secondary | ICD-10-CM | POA: Diagnosis not present

## 2019-10-23 DIAGNOSIS — E113293 Type 2 diabetes mellitus with mild nonproliferative diabetic retinopathy without macular edema, bilateral: Secondary | ICD-10-CM | POA: Diagnosis not present

## 2019-10-23 DIAGNOSIS — H353132 Nonexudative age-related macular degeneration, bilateral, intermediate dry stage: Secondary | ICD-10-CM | POA: Diagnosis not present

## 2019-10-23 DIAGNOSIS — H25013 Cortical age-related cataract, bilateral: Secondary | ICD-10-CM | POA: Diagnosis not present

## 2019-10-27 ENCOUNTER — Other Ambulatory Visit: Payer: Self-pay | Admitting: Family Medicine

## 2019-11-07 DIAGNOSIS — E782 Mixed hyperlipidemia: Secondary | ICD-10-CM | POA: Diagnosis not present

## 2019-11-07 DIAGNOSIS — Z794 Long term (current) use of insulin: Secondary | ICD-10-CM | POA: Diagnosis not present

## 2019-11-07 DIAGNOSIS — E1165 Type 2 diabetes mellitus with hyperglycemia: Secondary | ICD-10-CM | POA: Diagnosis not present

## 2019-11-15 DIAGNOSIS — Z23 Encounter for immunization: Secondary | ICD-10-CM | POA: Diagnosis not present

## 2019-11-15 DIAGNOSIS — E1165 Type 2 diabetes mellitus with hyperglycemia: Secondary | ICD-10-CM | POA: Diagnosis not present

## 2019-11-15 DIAGNOSIS — E782 Mixed hyperlipidemia: Secondary | ICD-10-CM | POA: Diagnosis not present

## 2019-11-15 DIAGNOSIS — I10 Essential (primary) hypertension: Secondary | ICD-10-CM | POA: Diagnosis not present

## 2019-11-19 ENCOUNTER — Encounter: Payer: Self-pay | Admitting: Family Medicine

## 2019-11-19 DIAGNOSIS — M79674 Pain in right toe(s): Secondary | ICD-10-CM

## 2019-11-19 DIAGNOSIS — E119 Type 2 diabetes mellitus without complications: Secondary | ICD-10-CM

## 2019-11-20 NOTE — Telephone Encounter (Signed)
Please advise 

## 2019-11-26 ENCOUNTER — Other Ambulatory Visit: Payer: Self-pay | Admitting: Family Medicine

## 2019-11-28 ENCOUNTER — Ambulatory Visit (INDEPENDENT_AMBULATORY_CARE_PROVIDER_SITE_OTHER): Payer: Medicare Other

## 2019-11-28 DIAGNOSIS — Z0000 Encounter for general adult medical examination without abnormal findings: Secondary | ICD-10-CM | POA: Diagnosis not present

## 2019-11-28 NOTE — Progress Notes (Signed)
Virtual Visit via Telephone Note  I connected with  [name] on 11/28/19 at  1:45 PM EDT by telephone and verified that I am speaking with the correct person using two identifiers.  Medicare Annual Wellness visit completed telephonically due to Covid-19 pandemic.   Persons participating in this call: This Health Coach and this patient.   Location: Patient: Home Provider: Office   I discussed the limitations, risks, security and privacy concerns of performing an evaluation and management service by telephone and the availability of in person appointments. The patient expressed understanding and agreed to proceed.  Unable to perform video visit due to video visit attempted and failed and/or patient does not have video capability.   Some vital signs may be absent or patient reported.   Willette Brace, LPN    Subjective:   GEOVANNY SARTIN is a 86 y.o. male who presents for Medicare Annual/Subsequent preventive examination.  Review of Systems     Cardiac Risk Factors include: hypertension;diabetes mellitus;dyslipidemia;male gender     Objective:    There were no vitals filed for this visit. There is no height or weight on file to calculate BMI.  Advanced Directives 11/28/2019 02/10/2019 11/09/2018 10/27/2017 12/20/2016 02/18/2016 04/01/2011  Does Patient Have a Medical Advance Directive? Yes Yes Yes Yes No Yes Patient does not have advance directive  Type of Advance Directive [place];Living will [place];Living will Healthcare Power of [place];Living will -  Does patient want to make changes to medical advance directive? - - No - Patient declined No - Patient declined - No - Patient declined -  Copy of [place] in Chart? No - copy requested - No - copy requested No - copy requested - No - copy requested -  Would patient like information on creating a  medical advance directive? - - - - No - Patient declined - -  Pre-existing out of facility DNR order (yellow form or pink MOST form) - - - - - - No    Current Medications (verified) Outpatient Encounter Medications as of 11/28/2019  Medication Sig  . acetaminophen (TYLENOL) 650 MG CR tablet Take 1,300 mg by mouth every 8 (eight) hours as needed for pain.  . Ascorbic Acid (VITAMIN C) 1000 MG tablet Take 1,000 mg by mouth daily.  [name] Kitchen aspirin 81 MG tablet Take 1 tablet (81 mg total) by mouth at bedtime.  . B-D UF III MINI PEN NEEDLES 31G X 5 MM MISC SMARTSIG:1 Needle SUB-Q Daily  . CINNAMON PO Take 2,000 mg by mouth at bedtime.  [name] Kitchen co-enzyme Q-10 30 MG capsule Take 30 mg by mouth daily.    [name] Kitchen doxazosin (CARDURA) 4 MG tablet Take 1 tablet by mouth once daily  . esomeprazole (NEXIUM) 40 MG capsule Take 1 capsule (40 mg total) by mouth daily before breakfast.  . glucose blood (ONETOUCH VERIO) test strip Use to test blood sugars 4 times a day or as directed (Dx: E11.65)  . hydrochlorothiazide (HYDRODIURIL) 12.5 MG tablet Take 1 tablet (12.5 mg total) by mouth daily. (Patient taking differently: Take 25 mg by mouth daily. )  . Insulin Glargine (TOUJEO SOLOSTAR [place]) Inject 24 Units into the skin every morning.   . metFORMIN (GLUCOPHAGE-XR) 750 MG 24 hr tablet Take 750 mg by mouth 2 (two) times daily.    . Multiple Vitamins-Minerals (MENS 50+ MULTI VITAMIN/MIN) TABS Take 1 tablet by mouth daily.  [name] Kitchen  pioglitazone (ACTOS) 45 MG tablet Take 45 mg by mouth daily.  . pravastatin (PRAVACHOL) 80 MG tablet Take 80 mg by mouth at bedtime.   . [DISCONTINUED] Insulin Pen Needle (B-D UF III MINI PEN NEEDLES) 31G X 5 MM MISC Use one per day for insulin injection  . [DISCONTINUED] ONETOUCH VERIO test strip 1 each 4 (four) times daily.  . Oxymetazoline HCl 1 % CREA Apply 1 application topically daily. (Patient not taking: Reported on 07/01/2019)   No facility-administered encounter medications on file as of 11/28/2019.     Allergies (verified) Patient has no known allergies.   History: Past Medical History:  Diagnosis Date  . Abnormal stress electrocardiogram test Jan 2013   Negative cardiac catheterization.   . Diabetes mellitus, type 2 (HCC)    Dr Altheimer  . Diverticulosis   . GERD (gastroesophageal reflux disease)   . Hiatal hernia   . Hyperlipidemia   . Hypertension   . Normal cardiac stress test Jan 2009   Normal ETT  . Palpitations    Negative holter in 2009 except for tachycardia with bicycling   Past Surgical History:  Procedure Laterality Date  . CARDIAC CATHETERIZATION  Feb 2013   Mild nonobstructive CAD. Normal LV function  . CHOLECYSTECTOMY    . COLONOSCOPY     Dr Fuller Plan  . COLONOSCOPY WITH PROPOFOL N/A 12/22/2016   Procedure: COLONOSCOPY WITH PROPOFOL;  Surgeon: Gatha Mayer, MD;  Location: [facility];  Service: Endoscopy;  Laterality: N/A;  . HEMORRHOID SURGERY    . INGUINAL HERNIA REPAIR    . ORIF CLAVICULAR FRACTURE     left  . TONSILLECTOMY     Family History  Problem Relation Age of Onset  . Prostate cancer Father   . Diabetes Father   . Diverticulosis Father   . Hypertension Brother   . Diabetes Brother   . Prostate cancer Paternal Grandfather   . Diabetes Paternal Grandmother   . Colon cancer Neg Hx    Social History   Socioeconomic History  . Marital status: Married    Spouse name: Not on file  . Number of children: Not on file  . Years of education: Not on file  . Highest education level: Not on file  Occupational History  . Occupation: Retired  Tobacco Use  . Smoking status: Never Smoker  . Smokeless tobacco: Never Used  Vaping Use  . Vaping Use: Never used  Substance and Sexual Activity  . Alcohol use: No    Alcohol/week: 0.0 standard drinks  . Drug use: No  . Sexual activity: Yes  Other Topics Concern  . Not on file  Social History Narrative  . Not on file   Social Determinants of Health   Financial Resource Strain: Low Risk    . Difficulty of Paying Living Expenses: Not hard at all  Food Insecurity: No Food Insecurity  . Worried About Charity fundraiser in the Last Year: Never true  . Ran Out of Food in the Last Year: Never true  Transportation Needs: No Transportation Needs  . Lack of Transportation (Medical): No  . Lack of Transportation (Non-Medical): No  Physical Activity: Inactive  . Days of Exercise per Week: 0 days  . Minutes of Exercise per Session: 0 min  Stress: No Stress Concern Present  . Feeling of Stress : Not at all  Social Connections: Moderately Integrated  . Frequency of Communication with Friends and Family: More than three times a week  . Frequency of  Social Gatherings with Friends and Family: More than three times a week  . Attends Religious Services: 1 to 4 times per year  . Active Member of Clubs or Organizations: No  . Attends Archivist Meetings: Never  . Marital Status: Married    Tobacco Counseling Counseling given: Not Answered   Clinical Intake:  Pre-visit preparation completed: Yes        BMI - recorded: 24.14 Nutritional Status: BMI of 19-24  Normal Diabetes: Yes     Diabetic?Yes  Interpreter Needed?: No  Information entered by :: Charlott Rakes, LPN   Activities of Daily Living In your present state of health, do you have any difficulty performing the following activities: 11/28/2019  Hearing? N  Vision? N  Difficulty concentrating or making decisions? N  Walking or climbing stairs? N  Dressing or bathing? N  Doing errands, shopping? N  Preparing Food and eating ? N  Using the Toilet? N  In the past six months, have you accidently leaked urine? N  Do you have problems with loss of bowel control? N  Managing your Medications? N  Managing your Finances? N  Housekeeping or managing your Housekeeping? N  Some recent data might be hidden    Patient Care Team: Vivi Barrack, MD as PCP - General (Family Medicine) Festus Aloe, MD  as Consulting Physician (Urology) Minus Breeding, MD as Consulting Physician (Cardiology) Ladene Artist, MD as Consulting Physician (Gastroenterology) Altheimer, Legrand Como, MD as Consulting Physician (Endocrinology) Hayden Pedro, MD as Consulting Physician (Ophthalmology) [facility], [facility] (Specialist)  Indicate any recent Medical Services you may have received from other than [facility] providers in the past year (date may be approximate).     Assessment:   This is a routine wellness examination for Moussa.  Hearing/Vision screen  Hearing Screening   125Hz  250Hz  500Hz  1000Hz  2000Hz  3000Hz  4000Hz  6000Hz  8000Hz   Right ear:           Left ear:           Comments: No difficulty hearing at this time  Vision Screening Comments: Pt follows up with Dr Tempie Hoist for eye exams  Dietary issues and exercise activities discussed: Current Exercise Habits: The patient does not participate in regular exercise at present  Goals    . Patient Stated     More exercise    . Weight (lb) < 170 lb (77.1 kg)     Would like to lose 7 lbs by increasing activity.       Depression Screen PHQ 2/9 Scores 11/28/2019 01/22/2019 11/09/2018 10/27/2017 02/18/2016 05/12/2014 02/26/2013  PHQ - 2 Score 0 0 0 0 0 0 0    Fall Risk Fall Risk  11/28/2019 01/22/2019 11/09/2018 10/27/2017 02/18/2016  Falls in the past year? 0 0 0 No No  Number falls in past yr: 0 - 0 - -  Injury with Fall? 0 - 0 - -  Follow up Falls prevention discussed - Education provided;Falls evaluation completed - -    Any stairs in or around the home? Yes  If so, are there any without handrails? Yes  Home free of loose throw rugs in walkways, pet beds, electrical cords, etc? Yes  Adequate lighting in your home to reduce risk of falls? Yes   ASSISTIVE DEVICES UTILIZED TO PREVENT FALLS:  Life alert? No  Use of a cane, walker or w/c? No  Grab bars in the bathroom? Yes  Shower chair or bench in shower? Yes  Elevated toilet seat  or a  handicapped toilet? No   TIMED UP AND GO:  Was the test performed? No .     Cognitive Function:     6CIT Screen 11/28/2019 11/09/2018 10/27/2017  What Year? 0 points 0 points 0 points  What month? 0 points 0 points 0 points  What time? - 0 points 0 points  Count back from 20 0 points 0 points 0 points  Months in reverse 0 points 0 points 0 points  Repeat phrase 0 points 0 points -  Total Score - 0 -    Immunizations Immunization History  Administered Date(s) Administered  . Fluad Quad(high Dose 65+) 11/09/2018  . Influenza, High Dose Seasonal PF 01/13/2014, 12/08/2017, 11/09/2018  . Influenza-Unspecified 12/08/2014, 01/04/2016, 11/28/2016  . PFIZER SARS-COV-2 Vaccination 03/20/2019, 04/10/2019  . Pneumococcal Conjugate-13 02/18/2016  . Zoster 02/26/2010    TDAP status: Due, Education has been provided regarding the importance of this vaccine. Advised may receive this vaccine at local pharmacy or Health Dept. Aware to provide a copy of the vaccination record if obtained from local pharmacy or Health Dept. Verbalized acceptance and understanding. Flu Vaccine status: Declined, Education has been provided regarding the importance of this vaccine but patient still declined. Advised may receive this vaccine at local pharmacy or Health Dept. Aware to provide a copy of the vaccination record if obtained from local pharmacy or Health Dept. Verbalized acceptance and understanding. Pneumococcal vaccine status: Up to date Covid-19 vaccine status: Completed vaccines  Qualifies for Shingles Vaccine? Yes   Zostavax completed Yes   Shingrix Completed?: No.    Education has been provided regarding the importance of this vaccine. Patient has been advised to call insurance company to determine out of pocket expense if they have not yet received this vaccine. Advised may also receive vaccine at local pharmacy or Health Dept. Verbalized acceptance and understanding.  Screening Tests Health  Maintenance  Topic Date Due  . FOOT EXAM  05/17/2018  . HEMOGLOBIN A1C  05/13/2019  . OPHTHALMOLOGY EXAM  09/18/2019  . INFLUENZA VACCINE  09/29/2019  . TETANUS/TDAP  01/22/2020 (Originally 05/03/1952)  . PNA vac Low Risk Adult (2 of 2 - PPSV23) 01/22/2020 (Originally 02/17/2017)  . COVID-19 Vaccine  Completed    Health Maintenance  Health Maintenance Due  Topic Date Due  . FOOT EXAM  05/17/2018  . HEMOGLOBIN A1C  05/13/2019  . OPHTHALMOLOGY EXAM  09/18/2019  . INFLUENZA VACCINE  09/29/2019    Colorectal cancer screening: No longer required.     Additional Screening:  Vision Screening: Recommended annual ophthalmology exams for early detection of glaucoma and other disorders of the eye. Is the patient up to date with their annual eye exam?  Yes  Who is the provider or what is the name of the office in which the patient attends annual eye exams? Dr Tempie Hoist  Dental Screening: Recommended annual dental exams for proper oral hygiene  Community Resource Referral / Chronic Care Management: CRR required this visit?  No   CCM required this visit?  No      Plan:     I have personally reviewed and noted the following in the patient's chart:   . Medical and social history . Use of alcohol, tobacco or illicit drugs  . Current medications and supplements . Functional ability and status . Nutritional status . Physical activity . Advanced directives . List of other physicians . Hospitalizations, surgeries, and ER visits in previous 12 months . Vitals . Screenings to include cognitive,  depression, and falls . Referrals and appointments  In addition, I have reviewed and discussed with patient certain preventive protocols, quality metrics, and best practice recommendations. A written personalized care plan for preventive services as well as general preventive health recommendations were provided to patient.     Willette Brace, LPN   [id number]   Nurse Notes:  None

## 2019-11-28 NOTE — Patient Instructions (Signed)
Mr. Scott Herman , Thank you for taking time to come for your Medicare Wellness Visit. I appreciate your ongoing commitment to your health goals. Please review the following plan we discussed and let me know if I can assist you in the future.   Screening recommendations/referrals: Colonoscopy: No longer required Recommended yearly ophthalmology/optometry visit for glaucoma screening and checkup Recommended yearly dental visit for hygiene and checkup  Vaccinations: Influenza vaccine: Due and discussed Pneumococcal vaccine: Up to date Tdap vaccine: Due and discussed Shingles vaccine: Shingrix discussed. Please contact your pharmacy for coverage information.    Covid-19: Completed 1/20 &04/10/19  Advanced directives: Please bring a copy of your health care power of attorney and living will to the office at your convenience.  Conditions/risks identified: get more exercise  Next appointment: Follow up in one year for your annual wellness visit.   Preventive Care 84 Years and Older, Male Preventive care refers to lifestyle choices and visits with your health care provider that can promote health and wellness. What does preventive care include?  A yearly physical exam. This is also called an annual well check.  Dental exams once or twice a year.  Routine eye exams. Ask your health care provider how often you should have your eyes checked.  Personal lifestyle choices, including:  Daily care of your teeth and gums.  Regular physical activity.  Eating a healthy diet.  Avoiding tobacco and drug use.  Limiting alcohol use.  Practicing safe sex.  Taking low doses of aspirin every day.  Taking vitamin and mineral supplements as recommended by your health care provider. What happens during an annual well check? The services and screenings done by your health care provider during your annual well check will depend on your age, overall health, lifestyle risk factors, and family history of  disease. Counseling  Your health care provider may ask you questions about your:  Alcohol use.  Tobacco use.  Drug use.  Emotional well-being.  Home and relationship well-being.  Sexual activity.  Eating habits.  History of falls.  Memory and ability to understand (cognition).  Work and work Statistician. Screening  You may have the following tests or measurements:  Height, weight, and BMI.  Blood pressure.  Lipid and cholesterol levels. These may be checked every 5 years, or more frequently if you are over 84 years old.  Skin check.  Lung cancer screening. You may have this screening every year starting at age 84 if you have a 30-pack-year history of smoking and currently smoke or have quit within the past 15 years.  Fecal occult blood test (FOBT) of the stool. You may have this test every year starting at age 84.  Flexible sigmoidoscopy or colonoscopy. You may have a sigmoidoscopy every 5 years or a colonoscopy every 10 years starting at age 84.  Prostate cancer screening. Recommendations will vary depending on your family history and other risks.  Hepatitis C blood test.  Hepatitis B blood test.  Sexually transmitted disease (STD) testing.  Diabetes screening. This is done by checking your blood sugar (glucose) after you have not eaten for a while (fasting). You may have this done every 1-3 years.  Abdominal aortic aneurysm (AAA) screening. You may need this if you are a current or former smoker.  Osteoporosis. You may be screened starting at age 84 if you are at high risk. Talk with your health care provider about your test results, treatment options, and if necessary, the need for more tests. Vaccines  Your health  care provider may recommend certain vaccines, such as:  Influenza vaccine. This is recommended every year.  Tetanus, diphtheria, and acellular pertussis (Tdap, Td) vaccine. You may need a Td booster every 10 years.  Zoster vaccine. You may  need this after age 84.  Pneumococcal 13-valent conjugate (PCV13) vaccine. One dose is recommended after age 84.  Pneumococcal polysaccharide (PPSV23) vaccine. One dose is recommended after age 40. Talk to your health care provider about which screenings and vaccines you need and how often you need them. This information is not intended to replace advice given to you by your health care provider. Make sure you discuss any questions you have with your health care provider. Document Released: 03/13/2015 Document Revised: 11/04/2015 Document Reviewed: 12/16/2014 Elsevier Interactive Patient Education  2017 [place] Prevention in the Home Falls can cause injuries. They can happen to people of all ages. There are many things you can do to make your home safe and to help prevent falls. What can I do on the outside of my home?  Regularly fix the edges of walkways and driveways and fix any cracks.  Remove anything that might make you trip as you walk through a door, such as a raised step or threshold.  Trim any bushes or trees on the path to your home.  Use bright outdoor lighting.  Clear any walking paths of anything that might make someone trip, such as rocks or tools.  Regularly check to see if handrails are loose or broken. Make sure that both sides of any steps have handrails.  Any raised decks and porches should have guardrails on the edges.  Have any leaves, snow, or ice cleared regularly.  Use sand or salt on walking paths during winter.  Clean up any spills in your garage right away. This includes oil or grease spills. What can I do in the bathroom?  Use night lights.  Install grab bars by the toilet and in the tub and shower. Do not use towel bars as grab bars.  Use non-skid mats or decals in the tub or shower.  If you need to sit down in the shower, use a plastic, non-slip stool.  Keep the floor dry. Clean up any water that spills on the floor as soon as it  happens.  Remove soap buildup in the tub or shower regularly.  Attach bath mats securely with double-sided non-slip rug tape.  Do not have throw rugs and other things on the floor that can make you trip. What can I do in the bedroom?  Use night lights.  Make sure that you have a light by your bed that is easy to reach.  Do not use any sheets or blankets that are too big for your bed. They should not hang down onto the floor.  Have a firm chair that has side arms. You can use this for support while you get dressed.  Do not have throw rugs and other things on the floor that can make you trip. What can I do in the kitchen?  Clean up any spills right away.  Avoid walking on wet floors.  Keep items that you use a lot in easy-to-reach places.  If you need to reach something above you, use a strong step stool that has a grab bar.  Keep electrical cords out of the way.  Do not use floor polish or wax that makes floors slippery. If you must use wax, use non-skid floor wax.  Do not have  throw rugs and other things on the floor that can make you trip. What can I do with my stairs?  Do not leave any items on the stairs.  Make sure that there are handrails on both sides of the stairs and use them. Fix handrails that are broken or loose. Make sure that handrails are as long as the stairways.  Check any carpeting to make sure that it is firmly attached to the stairs. Fix any carpet that is loose or worn.  Avoid having throw rugs at the top or bottom of the stairs. If you do have throw rugs, attach them to the floor with carpet tape.  Make sure that you have a light switch at the top of the stairs and the bottom of the stairs. If you do not have them, ask someone to add them for you. What else can I do to help prevent falls?  Wear shoes that:  Do not have high heels.  Have rubber bottoms.  Are comfortable and fit you well.  Are closed at the toe. Do not wear sandals.  If you  use a stepladder:  Make sure that it is fully opened. Do not climb a closed stepladder.  Make sure that both sides of the stepladder are locked into place.  Ask someone to hold it for you, if possible.  Clearly mark and make sure that you can see:  Any grab bars or handrails.  First and last steps.  Where the edge of each step is.  Use tools that help you move around (mobility aids) if they are needed. These include:  Canes.  Walkers.  Scooters.  Crutches.  Turn on the lights when you go into a dark area. Replace any light bulbs as soon as they burn out.  Set up your furniture so you have a clear path. Avoid moving your furniture around.  If any of your floors are uneven, fix them.  If there are any pets around you, be aware of where they are.  Review your medicines with your doctor. Some medicines can make you feel dizzy. This can increase your chance of falling. Ask your doctor what other things that you can do to help prevent falls. This information is not intended to replace advice given to you by your health care provider. Make sure you discuss any questions you have with your health care provider. Document Released: 12/11/2008 Document Revised: 07/23/2015 Document Reviewed: 03/21/2014 Elsevier Interactive Patient Education  2017 [employer].

## 2019-12-10 ENCOUNTER — Other Ambulatory Visit: Payer: Self-pay

## 2019-12-10 ENCOUNTER — Ambulatory Visit: Payer: Medicare Other | Admitting: Podiatry

## 2019-12-10 DIAGNOSIS — B351 Tinea unguium: Secondary | ICD-10-CM | POA: Diagnosis not present

## 2019-12-10 DIAGNOSIS — E119 Type 2 diabetes mellitus without complications: Secondary | ICD-10-CM | POA: Diagnosis not present

## 2019-12-10 MED ORDER — CICLOPIROX 8 % EX SOLN: Freq: Every day | CUTANEOUS | 0 refills | Status: DC

## 2019-12-10 NOTE — Patient Instructions (Signed)
Diabetes Mellitus and Foot Care Foot care is an important part of your health, especially when you have diabetes. Diabetes may cause you to have problems because of poor blood flow (circulation) to your feet and legs, which can cause your skin to:  Become thinner and drier.  Break more easily.  Heal more slowly.  Peel and crack. You may also have nerve damage (neuropathy) in your legs and feet, causing decreased feeling in them. This means that you may not notice minor injuries to your feet that could lead to more serious problems. Noticing and addressing any potential problems early is the best way to prevent future foot problems. How to care for your feet Foot hygiene  Wash your feet daily with warm water and mild soap. Do not use hot water. Then, pat your feet and the areas between your toes until they are completely dry. Do not soak your feet as this can dry your skin.  Trim your toenails straight across. Do not dig under them or around the cuticle. File the edges of your nails with an emery board or nail file.  Apply a moisturizing lotion or petroleum jelly to the skin on your feet and to dry, brittle toenails. Use lotion that does not contain alcohol and is unscented. Do not apply lotion between your toes. Shoes and socks  Wear clean socks or stockings every day. Make sure they are not too tight. Do not wear knee-high stockings since they may decrease blood flow to your legs.  Wear shoes that fit properly and have enough cushioning. Always look in your shoes before you put them on to be sure there are no objects inside.  To break in new shoes, wear them for just a few hours a day. This prevents injuries on your feet. Wounds, scrapes, corns, and calluses  Check your feet daily for blisters, cuts, bruises, sores, and redness. If you cannot see the bottom of your feet, use a mirror or ask someone for help.  Do not cut corns or calluses or try to remove them with medicine.  If you  find a minor scrape, cut, or break in the skin on your feet, keep it and the skin around it clean and dry. You may clean these areas with mild soap and water. Do not clean the area with peroxide, alcohol, or iodine.  If you have a wound, scrape, corn, or callus on your foot, look at it several times a day to make sure it is healing and not infected. Check for: ? Redness, swelling, or pain. ? Fluid or blood. ? Warmth. ? Pus or a bad smell. General instructions  Do not cross your legs. This may decrease blood flow to your feet.  Do not use heating pads or hot water bottles on your feet. They may burn your skin. If you have lost feeling in your feet or legs, you may not know this is happening until it is too late.  Protect your feet from hot and cold by wearing shoes, such as at the beach or on hot pavement.  Schedule a complete foot exam at least once a year (annually) or more often if you have foot problems. If you have foot problems, report any cuts, sores, or bruises to your health care provider immediately. Contact a health care provider if:  You have a medical condition that increases your risk of infection and you have any cuts, sores, or bruises on your feet.  You have an injury that is not   healing.  You have redness on your legs or feet.  You feel burning or tingling in your legs or feet.  You have pain or cramps in your legs and feet.  Your legs or feet are numb.  Your feet always feel cold.  You have pain around a toenail. Get help right away if:  You have a wound, scrape, corn, or callus on your foot and: ? You have pain, swelling, or redness that gets worse. ? You have fluid or blood coming from the wound, scrape, corn, or callus. ? Your wound, scrape, corn, or callus feels warm to the touch. ? You have pus or a bad smell coming from the wound, scrape, corn, or callus. ? You have a fever. ? You have a red line going up your leg. Summary  Check your feet every day  for cuts, sores, red spots, swelling, and blisters.  Moisturize feet and legs daily.  Wear shoes that fit properly and have enough cushioning.  If you have foot problems, report any cuts, sores, or bruises to your health care provider immediately.  Schedule a complete foot exam at least once a year (annually) or more often if you have foot problems. This information is not intended to replace advice given to you by your health care provider. Make sure you discuss any questions you have with your health care provider. Document Revised: 11/07/2018 Document Reviewed: 03/18/2016 Elsevier Patient Education  2020 Elsevier Inc.  

## 2019-12-10 NOTE — Progress Notes (Signed)
  Subjective:  Patient ID: Scott Herman, male    DOB: 1933-09-07,  MRN: [id number]  Chief Complaint  Patient presents with  . Nail Problem    Bilateral nail fungus 1-5, pt interested in topical antifungal.    86 y.o. male presents with the above complaint. History confirmed with patient. DM, well controlled per patient.  Objective:  Physical Exam: warm, good capillary refill, nail exam onychomycosis of the toenails, no trophic changes or ulcerative lesions. DP pulses palpable, PT pulses palpable and protective sensation intact Left Foot: hammertoes noted Right Foot: hammertoes noted  No images are attached to the encounter.  Assessment:   1. Type 2 diabetes mellitus without complication, without long-term current use of insulin ([place])   2. Onychomycosis   3. Encounter for diabetic foot exam [facility])      Plan:  Patient was evaluated and treated and all questions answered.  Diabetes without complication, Onychomycosis -Educated on diabetic footcare. Diabetic risk level 0 -Nails x10 debrided sharply and manually with large nail nipper and rotary burr. -Rx Penlac -F/u in 3 months for recheck   Return in about 3 months (around 03/11/2020) for Nail Fungus.

## 2019-12-21 ENCOUNTER — Other Ambulatory Visit: Payer: Self-pay | Admitting: Family Medicine

## 2019-12-28 ENCOUNTER — Other Ambulatory Visit: Payer: Self-pay | Admitting: Family Medicine

## 2020-01-27 ENCOUNTER — Other Ambulatory Visit: Payer: Self-pay | Admitting: Family Medicine

## 2020-01-30 ENCOUNTER — Emergency Department (HOSPITAL_COMMUNITY)
Admission: EM | Admit: 2020-01-30 | Discharge: 2020-01-31 | Disposition: A | Discharge status: Home / Self Care | Payer: Medicare Other | Attending: Emergency Medicine | Admitting: Emergency Medicine

## 2020-01-30 ENCOUNTER — Encounter (HOSPITAL_COMMUNITY): Payer: Self-pay

## 2020-01-30 ENCOUNTER — Other Ambulatory Visit: Payer: Self-pay

## 2020-01-30 ENCOUNTER — Emergency Department (HOSPITAL_COMMUNITY): Payer: Medicare Other

## 2020-01-30 DIAGNOSIS — E119 Type 2 diabetes mellitus without complications: Secondary | ICD-10-CM | POA: Diagnosis not present

## 2020-01-30 DIAGNOSIS — R079 Chest pain, unspecified: Secondary | ICD-10-CM | POA: Diagnosis not present

## 2020-01-30 DIAGNOSIS — I10 Essential (primary) hypertension: Secondary | ICD-10-CM | POA: Diagnosis not present

## 2020-01-30 DIAGNOSIS — Z7984 Long term (current) use of oral hypoglycemic drugs: Secondary | ICD-10-CM | POA: Diagnosis not present

## 2020-01-30 DIAGNOSIS — Z86010 Personal history of colonic polyps: Secondary | ICD-10-CM | POA: Diagnosis not present

## 2020-01-30 DIAGNOSIS — Z794 Long term (current) use of insulin: Secondary | ICD-10-CM | POA: Insufficient documentation

## 2020-01-30 DIAGNOSIS — R0789 Other chest pain: Secondary | ICD-10-CM | POA: Diagnosis not present

## 2020-01-30 DIAGNOSIS — Z79899 Other long term (current) drug therapy: Secondary | ICD-10-CM | POA: Diagnosis not present

## 2020-01-30 DIAGNOSIS — Z7982 Long term (current) use of aspirin: Secondary | ICD-10-CM | POA: Diagnosis not present

## 2020-01-30 LAB — CBC
HCT: 42 % (ref 39.0–52.0)
Hemoglobin: 13.8 g/dL (ref 13.0–17.0)
MCH: 30.1 pg (ref 26.0–34.0)
MCHC: 32.9 g/dL (ref 30.0–36.0)
MCV: 91.5 fL (ref 80.0–100.0)
Platelets: 264 10*3/uL (ref 150–400)
RBC: 4.59 MIL/uL (ref 4.22–5.81)
RDW: 13.3 % (ref 11.5–15.5)
WBC: 7 10*3/uL (ref 4.0–10.5)
nRBC: 0 % (ref 0.0–0.2)

## 2020-01-30 LAB — BASIC METABOLIC PANEL
Anion gap: 10 (ref 5–15)
BUN: 21 mg/dL (ref 8–23)
CO2: 29 mmol/L (ref 22–32)
Calcium: 9.1 mg/dL (ref 8.9–10.3)
Chloride: 97 mmol/L — ABNORMAL LOW (ref 98–111)
Creatinine, Ser: 0.87 mg/dL (ref 0.61–1.24)
GFR, Estimated: >60 mL/min (ref >60)
Glucose, Bld: 88 mg/dL (ref 70–99)
Potassium: 3.3 mmol/L — ABNORMAL LOW (ref 3.5–5.1)
Sodium: 136 mmol/L (ref 135–145)

## 2020-01-30 LAB — TROPONIN I (HIGH SENSITIVITY): Troponin I (High Sensitivity): 8 ng/L (ref <18)

## 2020-01-30 LAB — GLUCOSE, CAPILLARY: Glucose-Capillary: 78 mg/dL (ref 70–99)

## 2020-01-30 NOTE — ED Triage Notes (Signed)
Pt reports left sided chest pain that started today. Pt denies SHOB. Pt also endorses left wrist pain but believes it is arthritis.

## 2020-01-31 ENCOUNTER — Telehealth: Payer: Self-pay

## 2020-01-31 NOTE — ED Provider Notes (Signed)
[facility] DEPT Provider Note  CSN: [id number] Arrival date & time: 01/30/20 1638  Chief Complaint(s) Chest Pain and Wrist Pain  HPI Scott Herman is a 86 y.o. male with a past medical history listed below who presents to the emergency department at the recommendation of the on-call nurse his PCPs office.  Patient reports calling the office to inquire about the possibility of his Covid booster given him chest pains.  Scott Herman reports Scott Herman has been having intermittent chest pains which she attributes to pain radiating from his left wrist radiates up his left arm and into his chest.  This pain is sharp and shooting.  Scott Herman reports no pain today.  Believes Scott Herman last felt this pain yesterday.  Denies any associated shortness of breath.  Patient reports prior catheterizations have been clean.  No stenting or PCI.  Reports that Scott Herman has been doing yard work recently and has not had any associated chest pain.  Scott Herman denies any recent fevers or infections.  No coughing or congestion.  No abdominal pain.  No other physical complaints.  HPI  Past Medical History Past Medical History:  Diagnosis Date  . Abnormal stress electrocardiogram test Jan 2013   Negative cardiac catheterization.   . Diabetes mellitus, type 2 (HCC)    Dr Altheimer  . Diverticulosis   . GERD (gastroesophageal reflux disease)   . Hiatal hernia   . Hyperlipidemia   . Hypertension   . Normal cardiac stress test Jan 2009   Normal ETT  . Palpitations    Negative holter in 2009 except for tachycardia with bicycling   Patient Active Problem List   Diagnosis Date Noted  . Encounter for long-term (current) use of high-risk medication 08/13/2019  . Actinic keratosis 07/01/2019  . Leg swelling 07/01/2019  . Low back pain 11/01/2018  . Pain in right knee 11/21/2017  . Insomnia 08/28/2017  . Penile irritation 08/28/2017  . Yeast dermatitis of penis 08/17/2017  . Ankle pain 05/23/2017  . Rash of genital area  05/16/2017  . Benign neoplasm of sigmoid colon   . Lower GI bleed 12/20/2016  . Mixed dyslipidemia 04/15/2016  . BPH (benign prostatic hyperplasia) 01/12/2015  . Hypertension associated with diabetes ([place]) 06/26/2014  . Abnormal stress test 04/19/2011  . GERD 08/27/2009  . DIVERTICULOSIS-COLON 08/27/2009  . Diabetes ([place]) 05/28/2007  . Microscopic hematuria 05/28/2007  . Palpitations 02/09/2007   Home Medication(s) Prior to Admission medications   Medication Sig Start Date End Date Taking? Authorizing Provider  acetaminophen (TYLENOL) 650 MG CR tablet Take 1,300 mg by mouth every 8 (eight) hours as needed for pain.    [provider]  Ascorbic Acid (VITAMIN C) 1000 MG tablet Take 1,000 mg by mouth daily.    [provider]  aspirin 81 MG tablet Take 1 tablet (81 mg total) by mouth at bedtime. 12/29/16   Charlynne Cousins, MD  B-D UF III MINI PEN NEEDLES 31G X 5 MM MISC SMARTSIG:1 Needle SUB-Q Daily 11/14/19   [provider]  Blood Glucose Calibration (OT ULTRA/FASTTK CNTRL SOLN) SOLN Use to test blood sugars (Dx: E11.9)    [provider]  ciclopirox (PENLAC) 8 % solution Apply topically at bedtime. Apply over nail and surrounding skin. Apply daily over previous coat. Remove weekly with file or polish remover. 12/10/19   Evelina Bucy, DPM  CINNAMON PO Take 2,000 mg by mouth at bedtime.    [provider]  co-enzyme Q-10 30 MG capsule Take  30 mg by mouth daily.      [provider]  doxazosin (CARDURA) 4 MG tablet Take 1 tablet by mouth once daily 01/27/20   Vivi Barrack, MD  esomeprazole (NEXIUM) 40 MG capsule Take 1 capsule (40 mg total) by mouth daily before breakfast. 04/25/12   Ladene Artist, MD  glucose blood (ONETOUCH VERIO) test strip Use to test blood sugars 4 times a day or as directed (Dx: E11.65) 05/13/19   [provider]  hydrochlorothiazide (HYDRODIURIL) 12.5 MG tablet Take 1 tablet by mouth once daily  12/22/19   Vivi Barrack, MD  Insulin Glargine (TOUJEO SOLOSTAR [place]) Inject 24 Units into the skin every morning.     [provider]  metFORMIN (GLUCOPHAGE-XR) 750 MG 24 hr tablet Take 750 mg by mouth 2 (two) times daily.      [provider]  Multiple Vitamins-Minerals (MENS 50+ MULTI VITAMIN/MIN) TABS Take 1 tablet by mouth daily.    [provider]  Oxymetazoline HCl 1 % CREA Apply 1 application topically daily. 01/22/19   Vivi Barrack, MD  pioglitazone (ACTOS) 45 MG tablet Take 45 mg by mouth daily.    [provider]  pravastatin (PRAVACHOL) 80 MG tablet Take 80 mg by mouth at bedtime.     [provider]                                                                                                                                    Past Surgical History Past Surgical History:  Procedure Laterality Date  . CARDIAC CATHETERIZATION  Feb 2013   Mild nonobstructive CAD. Normal LV function  . CHOLECYSTECTOMY    . COLONOSCOPY     Dr Fuller Plan  . COLONOSCOPY WITH PROPOFOL N/A 12/22/2016   Procedure: COLONOSCOPY WITH PROPOFOL;  Surgeon: Gatha Mayer, MD;  Location: [facility] ENDOSCOPY;  Service: Endoscopy;  Laterality: N/A;  . HEMORRHOID SURGERY    . INGUINAL HERNIA REPAIR    . ORIF CLAVICULAR FRACTURE     left  . TONSILLECTOMY     Family History Family History  Problem Relation Age of Onset  . Prostate cancer Father   . Diabetes Father   . Diverticulosis Father   . Hypertension Brother   . Diabetes Brother   . Prostate cancer Paternal Grandfather   . Diabetes Paternal Grandmother   . Colon cancer Neg Hx     Social History Social History   Tobacco Use  . Smoking status: Never Smoker  . Smokeless tobacco: Never Used  Vaping Use  . Vaping Use: Never used  Substance Use Topics  . Alcohol use: No    Alcohol/week: 0.0 standard drinks  . Drug use: No   Allergies Patient has no known allergies.  Review of Systems Review of  Systems All other systems are reviewed and are negative for acute change except as noted in the HPI  Physical  Exam Vital Signs  I have reviewed the triage vital signs BP 114/72   Pulse 97   Temp 97.8 F (36.6 C) (Oral)   Resp 16   SpO2 98%   Physical Exam Vitals reviewed.  Constitutional:      General: Scott Herman is not in acute distress.    Appearance: Scott Herman is well-developed. Scott Herman is not diaphoretic.  HENT:     Head: Normocephalic and atraumatic.     Nose: Nose normal.  Eyes:     General: No scleral icterus.       Right eye: No discharge.        Left eye: No discharge.     Conjunctiva/sclera: Conjunctivae normal.     Pupils: Pupils are equal, round, and reactive to light.  Cardiovascular:     Rate and Rhythm: Normal rate and regular rhythm.     Heart sounds: No murmur heard.  No friction rub. No gallop.   Pulmonary:     Effort: Pulmonary effort is normal. No respiratory distress.     Breath sounds: Normal breath sounds. No stridor. No rales.  Abdominal:     General: There is no distension.     Palpations: Abdomen is soft.     Tenderness: There is no abdominal tenderness.  Musculoskeletal:        General: No tenderness.     Cervical back: Normal range of motion and neck supple.  Skin:    General: Skin is warm and dry.     Findings: No erythema or rash.  Neurological:     Mental Status: Scott Herman is alert and oriented to person, place, and time.     ED Results and Treatments Labs (all labs ordered are listed, but only abnormal results are displayed) Labs Reviewed  BASIC METABOLIC PANEL - Abnormal; Notable for the following components:      Result Value   Potassium 3.3 (*)    Chloride 97 (*)    All other components within normal limits  CBC  GLUCOSE, CAPILLARY  TROPONIN I (HIGH SENSITIVITY)  TROPONIN I (HIGH SENSITIVITY)                                                                                                                         EKG  EKG  Interpretation  Date/Time:  Thursday January 30 2020 17:11:04 EST Ventricular Rate:  90 PR Interval:    QRS Duration: 93 QT Interval:  367 QTC Calculation: 449 R Axis:   -63 Text Interpretation: Sinus rhythm Probable left atrial enlargement Left anterior fascicular block RSR' in V1 or V2, right VCD or RVH Baseline wander in lead(s) V1 12 Lead; [place]ar No significant change since last tracing Confirmed by Addison Lank [phone]) on 01/30/2020 11:33:18 PM      Radiology DG Chest 2 View  Result Date: 01/30/2020 CLINICAL DATA:  Onset left chest pain today. EXAM: CHEST - 2 VIEW COMPARISON:  PA and lateral chest 03/31/2011. FINDINGS: Lungs clear. Heart size normal. No pneumothorax  or pleural fluid. No acute or focal bony abnormality. IMPRESSION: No acute disease. Electronically Signed   By: Inge Rise M.D.   On: 01/30/2020 18:30    Pertinent labs & imaging results that were available during my care of the patient were reviewed by me and considered in my medical decision making (see chart for details).  Medications Ordered in ED Medications - No data to display                                                                                                                                  Procedures Procedures  (including critical care time)  Medical Decision Making / ED Course I have reviewed the nursing notes for this encounter and the patient's prior records (if available in EHR or on provided paperwork).   DINA MOBLEY was evaluated in Emergency Department on 01/31/2020 for the symptoms described in the history of present illness. Scott Herman was evaluated in the context of the global COVID-19 pandemic, which necessitated consideration that the patient might be at risk for infection with the SARS-CoV-2 virus that causes COVID-19. Institutional protocols and algorithms that pertain to the evaluation of patients at risk for COVID-19 are in a state of rapid change based on information  released by regulatory bodies including the CDC and federal and state organizations. These policies and algorithms were followed during the patient's care in the ED.  Patient denies any current chest pain within the last 24 hours. The chest pain that Scott Herman was inquired about appears to be atypical and related to radiating pain from his left wrist EKG without acute ischemic changes or evidence of pericarditis. Troponin is negative.  Do not feel that additional cardiac markers are warranted at this time given his presentation.  Low suspicion for pulmonary embolism.  Presentation not classic for aortic dissection or esophageal perforation.  Chest x-ray without evidence suggestive of pneumonia, pneumothorax, pneumomediastinum.  No abnormal contour of the mediastinum to suggest dissection. No evidence of acute injuries.       Final Clinical Impression(s) / ED Diagnoses Final diagnoses:  Intermittent chest pain    The patient appears reasonably screened and/or stabilized for discharge and I doubt any other medical condition or other [facility] requiring further screening, evaluation, or treatment in the ED at this time prior to discharge. Safe for discharge with strict return precautions.  Disposition: Discharge  Condition: Good  I have discussed the results, Dx and Tx plan with the patient/family who expressed understanding and agree(s) with the plan. Discharge instructions discussed at length. The patient/family was given strict return precautions who verbalized understanding of the instructions. No further questions at time of discharge.    ED Discharge Orders    None        Follow Up: Vivi Barrack, [place] [address] [phone]  Schedule an appointment as soon as possible for a visit  This chart was dictated using voice recognition software.  Despite best efforts to proofread,  errors can occur which can change the documentation meaning.   Fatima Blank, MD 01/31/20 0230

## 2020-01-31 NOTE — Telephone Encounter (Signed)
Pt went to ER yesterday.

## 2020-01-31 NOTE — Telephone Encounter (Signed)
Nurse Assessment Nurse: Loletha Carrow, RN, Ronalee Belts Date/Time (Eastern Time): 01/30/2020 3:45:20 PM Confirm and document reason for call. If symptomatic, describe symptoms. ---Caller states: He is having left sided chest pain and joint pain in my left arm. Denies any other symptoms Does the patient have any new or worsening symptoms? ---Yes Will a triage be completed? ---Yes Related visit to physician within the last 2 weeks? ---No Does the PT have any chronic conditions? (i.e. diabetes, asthma, this includes High risk factors for pregnancy, etc.) ---Yes List chronic conditions. ---Dm, HTN Is this a behavioral health or substance abuse call? ---No Guidelines Guideline Title Affirmed Question Affirmed Notes Nurse Date/Time (Eastern Time) Chest Pain Pain also in shoulder(s) or arm(s) or jaw (Exception: pain is clearly made worse by movement) Emch, RN, Ronalee Belts 01/30/2020 3:48:50 PM Disp. Time Eilene Ghazi Time) Disposition Final User 01/30/2020 3:40:42 PM Send to Urgent Mellody Dance 01/30/2020 3:53:52 PM Go to ED Now Yes Emch, RN, Vicenta Dunning Disagree/Comply Comply PLEASE NOTE: All timestamps contained within this report are represented as [place] Standard Time. CONFIDENTIALTY NOTICE: This fax transmission is intended only for the addressee. It contains information that is legally privileged, confidential or otherwise protected from use or disclosure. If you are not the intended recipient, you are strictly prohibited from reviewing, disclosing, copying using or disseminating any of this information or taking any action in reliance on or regarding this information. If you have received this fax in error, please notify us immediately by telephone so that we can arrange for its return to [place]. Phone: [phone], Toll-Free: [phone], Fax: [phone] Page: 2 of 2 Call Id: [id number] Jerome Understands Yes PreDisposition Did not know what to do Care Advice Given Per Guideline GO TO ED NOW: * Leave  now. Drive carefully. NOTE TO TRIAGER - DRIVING: * Another adult should drive. BRING MEDICINES: * Bring a list of your current medicines when you go to the Emergency Department (ER). CALL EMS IF: * Severe difficulty breathing occurs * You become worse CARE ADVICE given per Chest Pain (Adult) guideline. Comments User: Doretha Sou, RN Date/Time Eilene Ghazi Time): 01/30/2020 3:55:36 PM It does not seem to me that the caller will seek treatment in the recommended time frame User: Doretha Sou, RN Date/Time Eilene Ghazi Time): 01/30/2020 4:01:25 PM Office was informed of this outcome Referrals GO TO FACILITY REFUSED GO TO [facility]

## 2020-02-24 ENCOUNTER — Ambulatory Visit (INDEPENDENT_AMBULATORY_CARE_PROVIDER_SITE_OTHER): Payer: Medicare Other | Admitting: Family Medicine

## 2020-02-24 ENCOUNTER — Other Ambulatory Visit: Payer: Self-pay

## 2020-02-24 ENCOUNTER — Encounter: Payer: Self-pay | Admitting: Family Medicine

## 2020-02-24 VITALS — BP 124/72 | HR 100 | Temp 98.3°F | Resp 18 | Ht 71.0 in | Wt 177.6 lb

## 2020-02-24 DIAGNOSIS — M7989 Other specified soft tissue disorders: Secondary | ICD-10-CM

## 2020-02-24 DIAGNOSIS — E1159 Type 2 diabetes mellitus with other circulatory complications: Secondary | ICD-10-CM | POA: Diagnosis not present

## 2020-02-24 DIAGNOSIS — M79645 Pain in left finger(s): Secondary | ICD-10-CM | POA: Diagnosis not present

## 2020-02-24 DIAGNOSIS — E119 Type 2 diabetes mellitus without complications: Secondary | ICD-10-CM

## 2020-02-24 DIAGNOSIS — I152 Hypertension secondary to endocrine disorders: Secondary | ICD-10-CM

## 2020-02-24 DIAGNOSIS — M1990 Unspecified osteoarthritis, unspecified site: Secondary | ICD-10-CM | POA: Insufficient documentation

## 2020-02-24 DIAGNOSIS — G8929 Other chronic pain: Secondary | ICD-10-CM

## 2020-02-24 NOTE — Patient Instructions (Signed)
It was very nice to see you today!  I am glad the swelling in your legs has improved.  Please try using Voltaren gel for your hand.  Let me know if not improving.  Take care, Dr [name]  Please try these tips to maintain a healthy lifestyle:   Eat at least 3 REAL meals and 1-2 snacks per day.  Aim for no more than 5 hours between eating.  If you eat breakfast, please do so within one hour of getting up.    Each meal should contain half fruits/vegetables, one quarter protein, and one quarter carbs (no bigger than a computer mouse)   Cut down on sweet beverages. This includes juice, soda, and sweet tea.     Drink at least 1 glass of water with each meal and aim for at least 8 glasses per day   Exercise at least 150 minutes every week.

## 2020-02-24 NOTE — Assessment & Plan Note (Signed)
Mild flare and right thumb.  No history of gout.  Recommended over-the-counter Voltaren. He let me know if not improving.

## 2020-02-24 NOTE — Assessment & Plan Note (Signed)
At goal.  Continue HCTZ 12.5 mg daily. 

## 2020-02-24 NOTE — Progress Notes (Signed)
   Scott Herman is a 86 y.o. male who presents today for an office visit.  Assessment/Plan:  Chronic Problems Addressed Today: Diabetes (HCC) Managed by endocrinology.  Currently on Actos as part of his regimen which could be contributing.  Hypertension associated with diabetes (HCC) At goal.  Continue HCTZ 12.5mg  daily.   Osteoarthritis Mild flare and right thumb.  No history of gout.  Recommended over-the-counter Voltaren. He let me know if not improving.      Subjective:  HPI:  Patient here with concern for swelling of the lower extremities.  This is been a recurrent issue.  Symptoms have improved over the last couple of days.  No chest pain or shortness of breath.  He has also had some left thumb pain.  Started a few days ago.  No specific treatments tried.  No injuries.       Objective:  Physical Exam: BP 124/72   Pulse 100   Temp 98.3 F (36.8 C) (Temporal)   Resp 18   Ht 5\' 11"  (1.803 m)   Wt 177 lb 9.6 oz (80.6 kg)   SpO2 91%   BMI 24.77 kg/m   Gen: No acute distress, resting comfortably CV: Regular rate and rhythm with no murmurs appreciated Pulm: Normal work of breathing, clear to auscultation bilaterally with no crackles, wheezes, or rhonchi MSK: Pain to left  interphalangeal joint on first digit.  Neurovascular intact distally.  1+ pitting edema bilaterally to shins. Neuro: Grossly normal, moves all extremities Psych: Normal affect and thought content      Scott Herman M. , MD 02/24/2020 10:44 AM

## 2020-02-24 NOTE — Assessment & Plan Note (Signed)
Managed by endocrinology.  Currently on Actos as part of his regimen which could be contributing.

## 2020-03-09 ENCOUNTER — Other Ambulatory Visit: Payer: Self-pay | Admitting: Family Medicine

## 2020-03-13 ENCOUNTER — Ambulatory Visit: Payer: Medicare Other | Admitting: Podiatry

## 2020-03-31 ENCOUNTER — Other Ambulatory Visit: Payer: Medicare Other

## 2020-03-31 DIAGNOSIS — Z20822 Contact with and (suspected) exposure to covid-19: Secondary | ICD-10-CM | POA: Diagnosis not present

## 2020-04-02 LAB — NOVEL CORONAVIRUS, NAA: SARS-CoV-2, NAA: NOT DETECTED

## 2020-04-02 LAB — SARS-COV-2, NAA 2 DAY TAT

## 2020-04-13 DIAGNOSIS — I10 Essential (primary) hypertension: Secondary | ICD-10-CM | POA: Diagnosis not present

## 2020-04-13 DIAGNOSIS — E782 Mixed hyperlipidemia: Secondary | ICD-10-CM | POA: Diagnosis not present

## 2020-04-13 DIAGNOSIS — E1165 Type 2 diabetes mellitus with hyperglycemia: Secondary | ICD-10-CM | POA: Diagnosis not present

## 2020-04-13 DIAGNOSIS — Z794 Long term (current) use of insulin: Secondary | ICD-10-CM | POA: Diagnosis not present

## 2020-04-17 DIAGNOSIS — I10 Essential (primary) hypertension: Secondary | ICD-10-CM | POA: Diagnosis not present

## 2020-04-17 DIAGNOSIS — E782 Mixed hyperlipidemia: Secondary | ICD-10-CM | POA: Diagnosis not present

## 2020-04-17 DIAGNOSIS — E1165 Type 2 diabetes mellitus with hyperglycemia: Secondary | ICD-10-CM | POA: Diagnosis not present

## 2020-04-17 DIAGNOSIS — Z794 Long term (current) use of insulin: Secondary | ICD-10-CM | POA: Diagnosis not present

## 2020-04-24 ENCOUNTER — Other Ambulatory Visit: Payer: Self-pay | Admitting: Family Medicine

## 2020-05-26 ENCOUNTER — Other Ambulatory Visit: Payer: Self-pay

## 2020-05-26 ENCOUNTER — Ambulatory Visit (INDEPENDENT_AMBULATORY_CARE_PROVIDER_SITE_OTHER): Payer: Medicare Other | Admitting: Family Medicine

## 2020-05-26 ENCOUNTER — Encounter: Payer: Self-pay | Admitting: Family Medicine

## 2020-05-26 VITALS — BP 112/69 | HR 87 | Temp 98.7°F | Ht 71.0 in | Wt 176.2 lb

## 2020-05-26 DIAGNOSIS — N401 Enlarged prostate with lower urinary tract symptoms: Secondary | ICD-10-CM

## 2020-05-26 DIAGNOSIS — L570 Actinic keratosis: Secondary | ICD-10-CM | POA: Diagnosis not present

## 2020-05-26 DIAGNOSIS — M7989 Other specified soft tissue disorders: Secondary | ICD-10-CM | POA: Diagnosis not present

## 2020-05-26 LAB — PSA: PSA: 1.55 ng/mL (ref 0.10–4.00)

## 2020-05-26 NOTE — Assessment & Plan Note (Addendum)
Reassured patient.  Has trace edema today.  Recommended conservative management with leg elevation and salt avoidance.

## 2020-05-26 NOTE — Progress Notes (Signed)
   Scott Herman is a 87 y.o. male who presents today for an office visit.  Assessment/Plan:  Chronic Problems Addressed Today: BPH (benign prostatic hyperplasia) Stable on Cardura 4 mg daily.  Check PSA per patient request.  Leg swelling Reassured patient.  Has trace edema today.  Recommended conservative management with leg elevation and salt avoidance.  Actinic keratosis Cryotherapy applied.  See below procedure note.  He tolerated well.     Subjective:  HPI:  See a/p.         Objective:  Physical Exam: BP 112/69   Pulse 87   Temp 98.7 F (37.1 C) (Temporal)   Ht 5\' 11"  (1.803 m)   Wt 176 lb 3.2 oz (79.9 kg)   SpO2 97%   BMI 24.57 kg/m   Gen: No acute distress, resting comfortably Skin: Several scattered AKs on ears and scalp. Neuro: Grossly normal, moves all extremities Psych: Normal affect and thought content  Cryotherapy Procedure Note  Pre-operative Diagnosis: Actinic keratosis  Locations: Scalp, face, right ear  Indications: Therapeutic  Procedure Details  Patient informed of risks (permanent scarring, infection, light or dark discoloration, bleeding, infection, weakness, numbness and recurrence of the lesion) and benefits of the procedure and verbal informed consent obtained.  The areas are treated with liquid nitrogen therapy, frozen until ice ball extended 3 mm beyond lesion, allowed to thaw, and treated again.  A total of 6 lesions were treated the patient tolerated procedure well.  The patient was instructed on post-op care, warned that there may be blister formation, redness and pain. Recommend OTC analgesia as needed for pain.  Condition: Stable  Complications: none.        [name]. [name], MD 05/26/2020 2:50 PM

## 2020-05-26 NOTE — Assessment & Plan Note (Signed)
Cryotherapy applied.  See below procedure note.  He tolerated well.

## 2020-05-26 NOTE — Assessment & Plan Note (Signed)
Stable on Cardura 4 mg daily.  Check PSA per patient request.

## 2020-05-26 NOTE — Patient Instructions (Signed)
It was very nice to see you today!  We froze the spots on your head today.  We will check blood work.  I would like for you to see the dermatologist if the spots do not go away.  Take care, Dr [name]  PLEASE NOTE:  If you had any lab tests please let us know if you have not heard back within a few days. You may see your results on mychart before we have a chance to review them but we will give you a call once they are reviewed by [place]. If we ordered any referrals today, please let us know if you have not heard from their office within the next week.   Please try these tips to maintain a healthy lifestyle:   Eat at least 3 REAL meals and 1-2 snacks per day.  Aim for no more than 5 hours between eating.  If you eat breakfast, please do so within one hour of getting up.    Each meal should contain half fruits/vegetables, one quarter protein, and one quarter carbs (no bigger than a computer mouse)   Cut down on sweet beverages. This includes juice, soda, and sweet tea.     Drink at least 1 glass of water with each meal and aim for at least 8 glasses per day   Exercise at least 150 minutes every week.

## 2020-05-27 NOTE — Progress Notes (Signed)
Please inform patient of the following:  Good news! PSA level is normal.  Jorrell Kuster M. [name], MD 05/27/2020 11:12 AM

## 2020-06-01 ENCOUNTER — Other Ambulatory Visit: Payer: Self-pay | Admitting: Family Medicine

## 2020-07-02 ENCOUNTER — Other Ambulatory Visit: Payer: Self-pay | Admitting: Family Medicine

## 2020-07-09 ENCOUNTER — Other Ambulatory Visit: Payer: Self-pay | Admitting: Family Medicine

## 2020-07-14 DIAGNOSIS — E1165 Type 2 diabetes mellitus with hyperglycemia: Secondary | ICD-10-CM | POA: Diagnosis not present

## 2020-07-14 DIAGNOSIS — Z79899 Other long term (current) drug therapy: Secondary | ICD-10-CM | POA: Diagnosis not present

## 2020-07-14 DIAGNOSIS — I10 Essential (primary) hypertension: Secondary | ICD-10-CM | POA: Diagnosis not present

## 2020-07-14 DIAGNOSIS — E782 Mixed hyperlipidemia: Secondary | ICD-10-CM | POA: Diagnosis not present

## 2020-07-14 DIAGNOSIS — Z794 Long term (current) use of insulin: Secondary | ICD-10-CM | POA: Diagnosis not present

## 2020-07-17 DIAGNOSIS — Z125 Encounter for screening for malignant neoplasm of prostate: Secondary | ICD-10-CM | POA: Diagnosis not present

## 2020-07-17 DIAGNOSIS — E1165 Type 2 diabetes mellitus with hyperglycemia: Secondary | ICD-10-CM | POA: Diagnosis not present

## 2020-07-17 DIAGNOSIS — Z794 Long term (current) use of insulin: Secondary | ICD-10-CM | POA: Diagnosis not present

## 2020-07-17 DIAGNOSIS — I10 Essential (primary) hypertension: Secondary | ICD-10-CM | POA: Diagnosis not present

## 2020-07-28 ENCOUNTER — Encounter: Payer: Medicare Other | Admitting: Family Medicine

## 2020-08-03 ENCOUNTER — Telehealth: Payer: Self-pay

## 2020-08-03 NOTE — Telephone Encounter (Signed)
Called patient to reschedule physical, Scott Herman would like to know if he has to have fasting blood work done and if so is it okay to come in prior to the appointment so he doesn't have to fast until 220 on 8/9

## 2020-08-04 NOTE — Telephone Encounter (Signed)
Ok for him to come in early to get labs. We can order the same labs as last year.  Scott Herman. [name], MD 08/04/2020 8:29 AM

## 2020-08-04 NOTE — Telephone Encounter (Signed)
Patient requesting labs prior to appt on 10/06/20

## 2020-08-04 NOTE — Telephone Encounter (Signed)
Labs not needed patient voices understanding

## 2020-08-07 ENCOUNTER — Other Ambulatory Visit: Payer: Self-pay | Admitting: Family Medicine

## 2020-08-27 ENCOUNTER — Encounter: Payer: Medicare Other | Admitting: Family Medicine

## 2020-09-21 ENCOUNTER — Other Ambulatory Visit: Payer: Self-pay

## 2020-09-21 ENCOUNTER — Encounter (INDEPENDENT_AMBULATORY_CARE_PROVIDER_SITE_OTHER): Payer: Medicare Other | Admitting: Ophthalmology

## 2020-09-21 DIAGNOSIS — H353132 Nonexudative age-related macular degeneration, bilateral, intermediate dry stage: Secondary | ICD-10-CM

## 2020-09-21 DIAGNOSIS — E113392 Type 2 diabetes mellitus with moderate nonproliferative diabetic retinopathy without macular edema, left eye: Secondary | ICD-10-CM | POA: Diagnosis not present

## 2020-09-21 DIAGNOSIS — E113291 Type 2 diabetes mellitus with mild nonproliferative diabetic retinopathy without macular edema, right eye: Secondary | ICD-10-CM

## 2020-09-21 DIAGNOSIS — I10 Essential (primary) hypertension: Secondary | ICD-10-CM

## 2020-09-21 DIAGNOSIS — H35033 Hypertensive retinopathy, bilateral: Secondary | ICD-10-CM

## 2020-09-21 DIAGNOSIS — H43813 Vitreous degeneration, bilateral: Secondary | ICD-10-CM

## 2020-09-22 ENCOUNTER — Encounter: Payer: Medicare Other | Admitting: Family Medicine

## 2020-10-02 ENCOUNTER — Other Ambulatory Visit: Payer: Medicare Other

## 2020-10-06 ENCOUNTER — Ambulatory Visit (INDEPENDENT_AMBULATORY_CARE_PROVIDER_SITE_OTHER): Payer: Medicare Other | Admitting: Family Medicine

## 2020-10-06 ENCOUNTER — Other Ambulatory Visit: Payer: Self-pay

## 2020-10-06 ENCOUNTER — Encounter: Payer: Self-pay | Admitting: Family Medicine

## 2020-10-06 VITALS — BP 129/73 | HR 82 | Temp 97.8°F | Ht 71.0 in | Wt 178.2 lb

## 2020-10-06 DIAGNOSIS — E1159 Type 2 diabetes mellitus with other circulatory complications: Secondary | ICD-10-CM | POA: Diagnosis not present

## 2020-10-06 DIAGNOSIS — E782 Mixed hyperlipidemia: Secondary | ICD-10-CM | POA: Diagnosis not present

## 2020-10-06 DIAGNOSIS — Z0001 Encounter for general adult medical examination with abnormal findings: Secondary | ICD-10-CM

## 2020-10-06 DIAGNOSIS — E119 Type 2 diabetes mellitus without complications: Secondary | ICD-10-CM | POA: Diagnosis not present

## 2020-10-06 DIAGNOSIS — I152 Hypertension secondary to endocrine disorders: Secondary | ICD-10-CM

## 2020-10-06 NOTE — Patient Instructions (Signed)
It was very nice to see you today!  We will check blood work today.  We will see back in year for your next annual physical.  Please come back to see me sooner if needed.  Take care, Dr [name]  PLEASE NOTE:  If you had any lab tests please let us know if you have not heard back within a few days. You may see your results on mychart before we have a chance to review them but we will give you a call once they are reviewed by [place]. If we ordered any referrals today, please let us know if you have not heard from their office within the next week.   Please try these tips to maintain a healthy lifestyle:  Eat at least 3 REAL meals and 1-2 snacks per day.  Aim for no more than 5 hours between eating.  If you eat breakfast, please do so within one hour of getting up.   Each meal should contain half fruits/vegetables, one quarter protein, and one quarter carbs (no bigger than a computer mouse)  Cut down on sweet beverages. This includes juice, soda, and sweet tea.   Drink at least 1 glass of water with each meal and aim for at least 8 glasses per day  Exercise at least 150 minutes every week.    Preventive Care 85 Years and Older, Male Preventive care refers to lifestyle choices and visits with your health care provider that can promote health and wellness. This includes: A yearly physical exam. This is also called an annual wellness visit. Regular dental and eye exams. Immunizations. Screening for certain conditions. Healthy lifestyle choices, such as: Eating a healthy diet. Getting regular exercise. Not using drugs or products that contain nicotine and tobacco. Limiting alcohol use. What can I expect for my preventive care visit? Physical exam Your health care provider will check your: Height and weight. These may be used to calculate your BMI (body mass index). BMI is a measurement that tells if you are at a healthy weight. Heart rate and blood pressure. Body temperature. Skin for  abnormal spots. Counseling Your health care provider may ask you questions about your: Past medical problems. Family's medical history. Alcohol, tobacco, and drug use. Emotional well-being. Home life and relationship well-being. Sexual activity. Diet, exercise, and sleep habits. History of falls. Memory and ability to understand (cognition). Work and work Statistician. Access to firearms. What immunizations do I need?  Vaccines are usually given at various ages, according to a schedule. Your health care provider will recommend vaccines for you based on your age, medicalhistory, and lifestyle or other factors, such as travel or where you work. What tests do I need? Blood tests Lipid and cholesterol levels. These may be checked every 5 years, or more often depending on your overall health. Hepatitis C test. Hepatitis B test. Screening Lung cancer screening. You may have this screening every year starting at age 96 if you have a 30-pack-year history of smoking and currently smoke or have quit within the past 15 years. Colorectal cancer screening. All adults should have this screening starting at age 30 and continuing until age 78. Your health care provider may recommend screening at age 42 if you are at increased risk. You will have tests every 1-10 years, depending on your results and the type of screening test. Prostate cancer screening. Recommendations will vary depending on your family history and other risks. Genital exam to check for testicular cancer or hernias. Diabetes screening. This is  done by checking your blood sugar (glucose) after you have not eaten for a while (fasting). You may have this done every 1-3 years. Abdominal aortic aneurysm (AAA) screening. You may need this if you are a current or former smoker. STD (sexually transmitted disease) testing, if you are at risk. Follow these instructions at home: Eating and drinking  Eat a diet that includes fresh fruits and  vegetables, whole grains, lean protein, and low-fat dairy products. Limit your intake of foods with high amounts of sugar, saturated fats, and salt. Take vitamin and mineral supplements as recommended by your health care provider. Do not drink alcohol if your health care provider tells you not to drink. If you drink alcohol: Limit how much you have to 0-2 drinks a day. Be aware of how much alcohol is in your drink. In the [place] one drink equals one 12 oz bottle of beer (355 mL), one 5 oz glass of wine (148 mL), or one 1 oz glass of hard liquor (44 mL).  Lifestyle Take daily care of your teeth and gums. Brush your teeth every morning and night with fluoride toothpaste. Floss one time each day. Stay active. Exercise for at least 30 minutes 5 or more days each week. Do not use any products that contain nicotine or tobacco, such as cigarettes, e-cigarettes, and chewing tobacco. If you need help quitting, ask your health care provider. Do not use drugs. If you are sexually active, practice safe sex. Use a condom or other form of protection to prevent STIs (sexually transmitted infections). Talk with your health care provider about taking a low-dose aspirin or statin. Find healthy ways to cope with stress, such as: Meditation, yoga, or listening to music. Journaling. Talking to a trusted person. Spending time with friends and family. Safety Always wear your seat belt while driving or riding in a vehicle. Do not drive: If you have been drinking alcohol. Do not ride with someone who has been drinking. When you are tired or distracted. While texting. Wear a helmet and other protective equipment during sports activities. If you have firearms in your house, make sure you follow all gun safety procedures. What's next? Visit your health care provider once a year for an annual wellness visit. Ask your health care provider how often you should have your eyes and teeth checked. Stay up to date on all  vaccines. This information is not intended to replace advice given to you by your health care provider. Make sure you discuss any questions you have with your healthcare provider. Document Revised: 11/13/2018 Document Reviewed: 02/08/2018 Elsevier Patient Education  2022 [employer].

## 2020-10-06 NOTE — Assessment & Plan Note (Addendum)
Check labs.  Continue pravastatin 80 mg daily.

## 2020-10-06 NOTE — Progress Notes (Signed)
Chief Complaint:  Scott Herman is a 87 y.o. male who presents today for his annual comprehensive physical exam.    Assessment/Plan:  Chronic Problems Addressed Today: Diabetes ([place]) Follows with endocrinology.  A1c has been stable.  Hypertension associated with diabetes ([place]) At goal.  Continue HCTZ 12.5 mg daily.  We will check labs today.  Mixed dyslipidemia Check labs.  Continue pravastatin 80 mg daily.    Preventative Healthcare: Due for pneumonia vaccine, however deferred today.. Check labs.   Patient Counseling(The following topics were reviewed and/or handout was given):  -Nutrition: Stressed importance of moderation in sodium/caffeine intake, saturated fat and cholesterol, caloric balance, sufficient intake of fresh fruits, vegetables, and fiber.  -Stressed the importance of regular exercise.   -Substance Abuse: Discussed cessation/primary prevention of tobacco, alcohol, or other drug use; driving or other dangerous activities under the influence; availability of treatment for abuse.   -Injury prevention: Discussed safety belts, safety helmets, smoke detector, smoking near bedding or upholstery.   -Sexuality: Discussed sexually transmitted diseases, partner selection, use of condoms, avoidance of unintended pregnancy and contraceptive alternatives.   -Dental health: Discussed importance of regular tooth brushing, flossing, and dental visits.  -Health maintenance and immunizations reviewed. Please refer to Health maintenance section.  Return to care in 1 year for next preventative visit.     Subjective:  HPI:  He has no acute complaints today.   Lifestyle Diet: Balanced,drink plenty of fluids. Exercise: He walk 30 minutes a day or more.  Depression screen PHQ 2/9 10/06/2020  Decreased Interest 0  Down, Depressed, Hopeless 0  PHQ - 2 Score 0    Health Maintenance Due  Topic Date Due   TETANUS/TDAP  Never done   Zoster Vaccines- Shingrix (1 of 2) Never done    PNA vac Low Risk Adult (2 of 2 - PPSV23) 02/17/2017   FOOT EXAM  05/17/2018   HEMOGLOBIN A1C  05/13/2019   OPHTHALMOLOGY EXAM  09/18/2019   COVID-19 Vaccine (4 - Booster for Pfizer series) 02/29/2020   INFLUENZA VACCINE  09/28/2020     ROS: Per HPI, otherwise a complete review of systems was negative.   PMH:  The following were reviewed and entered/updated in epic: Past Medical History:  Diagnosis Date   Abnormal stress electrocardiogram test Jan 2013   Negative cardiac catheterization.    Diabetes mellitus, type 2 ([place])    Dr Altheimer   Diverticulosis    GERD (gastroesophageal reflux disease)    Hiatal hernia    Hyperlipidemia    Hypertension    Normal cardiac stress test Jan 2009   Normal ETT   Palpitations    Negative holter in 2009 except for tachycardia with bicycling   Patient Active Problem List   Diagnosis Date Noted   Osteoarthritis 02/24/2020   Encounter for long-term (current) use of high-risk medication 08/13/2019   Actinic keratosis 07/01/2019   Leg swelling 07/01/2019   Low back pain 11/01/2018   Pain in right knee 11/21/2017   Insomnia 08/28/2017   Penile irritation 08/28/2017   Yeast dermatitis of penis 08/17/2017   Ankle pain 05/23/2017   Rash of genital area 05/16/2017   Benign neoplasm of sigmoid colon    Lower GI bleed 12/20/2016   Mixed dyslipidemia 04/15/2016   BPH (benign prostatic hyperplasia) 01/12/2015   Hypertension associated with diabetes ([place]) 06/26/2014   Abnormal stress test 04/19/2011   GERD 08/27/2009   DIVERTICULOSIS-COLON 08/27/2009   Diabetes ([place]) 05/28/2007   Microscopic hematuria 05/28/2007  Palpitations 02/09/2007   Past Surgical History:  Procedure Laterality Date   CARDIAC CATHETERIZATION  Feb 2013   Mild nonobstructive CAD. Normal LV function   CHOLECYSTECTOMY     COLONOSCOPY     Dr Fuller Plan   COLONOSCOPY WITH PROPOFOL N/A 12/22/2016   Procedure: COLONOSCOPY WITH PROPOFOL;  Surgeon: Gatha Mayer, MD;   Location: [facility];  Service: Endoscopy;  Laterality: N/A;   HEMORRHOID SURGERY     INGUINAL HERNIA REPAIR     ORIF CLAVICULAR FRACTURE     left   TONSILLECTOMY      Family History  Problem Relation Age of Onset   Prostate cancer Father    Diabetes Father    Diverticulosis Father    Hypertension Brother    Diabetes Brother    Prostate cancer Paternal Grandfather    Diabetes Paternal Grandmother    Colon cancer Neg Hx     Medications- reviewed and updated Current Outpatient Medications  Medication Sig Dispense Refill   acetaminophen (TYLENOL) 650 MG CR tablet Take 1,300 mg by mouth every 8 (eight) hours as needed for pain.     Ascorbic Acid (VITAMIN C) 1000 MG tablet Take 1,000 mg by mouth daily.     aspirin 81 MG tablet Take 1 tablet (81 mg total) by mouth at bedtime. 30 tablet    B-D UF III MINI PEN NEEDLES 31G X 5 MM MISC SMARTSIG:1 Needle SUB-Q Daily     Blood Glucose Calibration (OT ULTRA/FASTTK CNTRL SOLN) SOLN Use to test blood sugars (Dx: E11.9)     ciclopirox (PENLAC) 8 % solution Apply topically at bedtime. Apply over nail and surrounding skin. Apply daily over previous coat. Remove weekly with file or polish remover. 6.6 mL 0   CINNAMON PO Take 2,000 mg by mouth at bedtime.     co-enzyme Q-10 30 MG capsule Take 30 mg by mouth daily.     doxazosin (CARDURA) 4 MG tablet Take 1 tablet by mouth once daily 90 tablet 1   esomeprazole (NEXIUM) 40 MG capsule Take 1 capsule (40 mg total) by mouth daily before breakfast. 90 capsule 1   glucose blood (ONETOUCH VERIO) test strip Use to test blood sugars 4 times a day or as directed (Dx: E11.65)     hydrochlorothiazide (HYDRODIURIL) 12.5 MG tablet Take 1 tablet by mouth once daily 90 tablet 0   Insulin Glargine (TOUJEO SOLOSTAR [place]) Inject 24 Units into the skin every morning.      metFORMIN (GLUCOPHAGE-XR) 750 MG 24 hr tablet Take 750 mg by mouth 2 (two) times daily.     Multiple Vitamins-Minerals (MENS 50+ MULTI VITAMIN/MIN)  TABS Take 1 tablet by mouth daily.     pioglitazone (ACTOS) 45 MG tablet Take 45 mg by mouth daily.     pravastatin (PRAVACHOL) 80 MG tablet Take 80 mg by mouth at bedtime.     No current facility-administered medications for this visit.    Allergies-reviewed and updated No Known Allergies  Social History   Socioeconomic History   Marital status: Married    Spouse name: Not on file   Number of children: Not on file   Years of education: Not on file   Highest education level: Not on file  Occupational History   Occupation: Retired  Tobacco Use   Smoking status: Never   Smokeless tobacco: Never  Vaping Use   Vaping Use: Never used  Substance and Sexual Activity   Alcohol use: No    Alcohol/week: 0.0 standard  drinks   Drug use: No   Sexual activity: Yes  Other Topics Concern   Not on file  Social History Narrative   Not on file   Social Determinants of Health   Financial Resource Strain: Low Risk    Difficulty of Paying Living Expenses: Not hard at all  Food Insecurity: No Food Insecurity   Worried About Charity fundraiser in the Last Year: Never true   [place] in the Last Year: Never true  Transportation Needs: No Transportation Needs   Lack of Transportation (Medical): No   Lack of Transportation (Non-Medical): No  Physical Activity: Inactive   Days of Exercise per Week: 0 days   Minutes of Exercise per Session: 0 min  Stress: No Stress Concern Present   Feeling of Stress : Not at all  Social Connections: Moderately Integrated   Frequency of Communication with Friends and Family: More than three times a week   Frequency of Social Gatherings with Friends and Family: More than three times a week   Attends Religious Services: 1 to 4 times per year   Active Member of [employer] or Organizations: No   Attends Archivist Meetings: Never   Marital Status: Married        Objective:  Physical Exam: BP 129/73   Pulse 82   Temp 97.8 F (36.6 C)  (Temporal)   Ht '5\' 11"'$  (1.803 m)   Wt 178 lb 3.2 oz (80.8 kg)   SpO2 96%   BMI 24.85 kg/m   Body mass index is 24.85 kg/m. Wt Readings from Last 3 Encounters:  10/06/20 178 lb 3.2 oz (80.8 kg)  05/26/20 176 lb 3.2 oz (79.9 kg)  02/24/20 177 lb 9.6 oz (80.6 kg)   Gen: NAD, resting comfortably HEENT: TMs normal bilaterally. OP clear. No thyromegaly noted.  CV: RRR with no murmurs appreciated Pulm: NWOB, CTAB with no crackles, wheezes, or rhonchi GI: Normal bowel sounds present. Soft, Nontender, Nondistended. MSK: no edema, cyanosis, or clubbing noted Skin: warm, dry Neuro: CN2-12 grossly intact. Strength 5/5 in upper and lower extremities. Reflexes symmetric and intact bilaterally.  Psych: Normal affect and thought content      I,Savera Zaman,acting as a scribe for Dimas Chyle, MD.,have documented all relevant documentation on the behalf of Dimas Chyle, MD,as directed by  Dimas Chyle, MD while in the presence of Dimas Chyle, MD.  I, Dimas Chyle, MD, have reviewed all documentation for this visit. The documentation on 10/06/20 for the exam, diagnosis, procedures, and orders are all accurate and complete.  [name]. [name], MD 10/06/2020 2:45 PM

## 2020-10-06 NOTE — Assessment & Plan Note (Signed)
Follows with endocrinology.  A1c has been stable.

## 2020-10-06 NOTE — Assessment & Plan Note (Signed)
At goal.  Continue HCTZ 12.5 mg daily.  We will check labs today.

## 2020-10-07 LAB — LIPID PANEL
Cholesterol: 109 mg/dL (ref 0–200)
HDL: 51.4 mg/dL (ref >39.00)
LDL Cholesterol: 36 mg/dL (ref 0–99)
NonHDL: 58.06
Total CHOL/HDL Ratio: 2
Triglycerides: 110 mg/dL (ref 0.0–149.0)
VLDL: 22 mg/dL (ref 0.0–40.0)

## 2020-10-07 LAB — COMPREHENSIVE METABOLIC PANEL
ALT: 12 U/L (ref 0–53)
AST: 17 U/L (ref 0–37)
Albumin: 3.8 g/dL (ref 3.5–5.2)
Alkaline Phosphatase: 72 U/L (ref 39–117)
BUN: 16 mg/dL (ref 6–23)
CO2: 26 mEq/L (ref 19–32)
Calcium: 9.3 mg/dL (ref 8.4–10.5)
Chloride: 101 mEq/L (ref 96–112)
Creatinine, Ser: 0.86 mg/dL (ref 0.40–1.50)
GFR: 79 mL/min (ref >60.00)
Glucose, Bld: 122 mg/dL — ABNORMAL HIGH (ref 70–99)
Potassium: 4 mEq/L (ref 3.5–5.1)
Sodium: 137 mEq/L (ref 135–145)
Total Bilirubin: 0.5 mg/dL (ref 0.2–1.2)
Total Protein: 6.4 g/dL (ref 6.0–8.3)

## 2020-10-07 LAB — CBC
HCT: 38.7 % — ABNORMAL LOW (ref 39.0–52.0)
Hemoglobin: 13.3 g/dL (ref 13.0–17.0)
MCHC: 34.4 g/dL (ref 30.0–36.0)
MCV: 88 fl (ref 78.0–100.0)
Platelets: 281 10*3/uL (ref 150.0–400.0)
RBC: 4.4 Mil/uL (ref 4.22–5.81)
RDW: 13.6 % (ref 11.5–15.5)
WBC: 6.9 10*3/uL (ref 4.0–10.5)

## 2020-10-07 LAB — TSH: TSH: 1.86 u[IU]/mL (ref 0.35–5.50)

## 2020-10-12 NOTE — Progress Notes (Signed)
Please inform patient of the following:  Labs are all stable. Would like for him to keep up the good work and we can recheck in a year or so.  [name]. [name], MD 10/12/2020 12:59 PM

## 2020-10-15 DIAGNOSIS — E782 Mixed hyperlipidemia: Secondary | ICD-10-CM | POA: Diagnosis not present

## 2020-10-15 DIAGNOSIS — I10 Essential (primary) hypertension: Secondary | ICD-10-CM | POA: Diagnosis not present

## 2020-10-15 DIAGNOSIS — Z794 Long term (current) use of insulin: Secondary | ICD-10-CM | POA: Diagnosis not present

## 2020-10-15 DIAGNOSIS — E1165 Type 2 diabetes mellitus with hyperglycemia: Secondary | ICD-10-CM | POA: Diagnosis not present

## 2020-10-20 DIAGNOSIS — I129 Hypertensive chronic kidney disease with stage 1 through stage 4 chronic kidney disease, or unspecified chronic kidney disease: Secondary | ICD-10-CM | POA: Diagnosis not present

## 2020-10-20 DIAGNOSIS — E1122 Type 2 diabetes mellitus with diabetic chronic kidney disease: Secondary | ICD-10-CM | POA: Diagnosis not present

## 2020-10-20 DIAGNOSIS — E1165 Type 2 diabetes mellitus with hyperglycemia: Secondary | ICD-10-CM | POA: Diagnosis not present

## 2020-10-20 DIAGNOSIS — E11649 Type 2 diabetes mellitus with hypoglycemia without coma: Secondary | ICD-10-CM | POA: Diagnosis not present

## 2020-10-25 ENCOUNTER — Other Ambulatory Visit: Payer: Self-pay | Admitting: Family Medicine

## 2020-11-10 ENCOUNTER — Other Ambulatory Visit: Payer: Self-pay

## 2020-11-10 ENCOUNTER — Encounter: Payer: Self-pay | Admitting: Dermatology

## 2020-11-10 ENCOUNTER — Ambulatory Visit: Payer: Medicare Other | Admitting: Dermatology

## 2020-11-10 DIAGNOSIS — D225 Melanocytic nevi of trunk: Secondary | ICD-10-CM

## 2020-11-10 DIAGNOSIS — L819 Disorder of pigmentation, unspecified: Secondary | ICD-10-CM | POA: Diagnosis not present

## 2020-11-10 DIAGNOSIS — Z1283 Encounter for screening for malignant neoplasm of skin: Secondary | ICD-10-CM

## 2020-11-10 DIAGNOSIS — L729 Follicular cyst of the skin and subcutaneous tissue, unspecified: Secondary | ICD-10-CM

## 2020-11-10 DIAGNOSIS — L821 Other seborrheic keratosis: Secondary | ICD-10-CM

## 2020-11-10 DIAGNOSIS — Z85828 Personal history of other malignant neoplasm of skin: Secondary | ICD-10-CM

## 2020-11-10 DIAGNOSIS — Z808 Family history of malignant neoplasm of other organs or systems: Secondary | ICD-10-CM

## 2020-11-10 DIAGNOSIS — L570 Actinic keratosis: Secondary | ICD-10-CM | POA: Diagnosis not present

## 2020-11-10 NOTE — Progress Notes (Signed)
    New Patient   Subjective  Scott Herman is a 87 y.o. male who presents for the following: Annual Exam (Right back of ear - x months- ln2 in past but still comes back + itch no bleed, left ear- no itch no bleed, left chest- past non mole skin cancer removed there about 25 years ago).  General skin examination; check spots on ear and chest. Location:  Duration:  Quality:  Associated Signs/Symptoms: Modifying Factors:  Severity:  Timing: Context:    The following portions of the chart were reviewed this encounter and updated as appropriate:      Objective  Well appearing patient in no apparent distress; mood and affect are within normal limits. Torso - Posterior (Back) Waist up exam right post ear papilloma, ak's on the forehead, large nevus x3 on the left chest, thin discolored areas on the top of both hands, keratoses neck, scalp, face. Cyst front neck upper chest.  Patient stated his middle son had MM of the eye.  Left Ear, Right Ear Left ear rim    All skin waist up examined.   Assessment & Plan  Screening exam for skin cancer Torso - Posterior (Back)  No atypical moles or skin cancer found today, follow up 1 year  AK (actinic keratosis) (2) Left Ear; Right Ear  Patient aware the areas may swell and peel no Band-Aid needed  Destruction of lesion - Left Ear, Right Ear Complexity: simple   Destruction method: cryotherapy   Informed consent: discussed and consent obtained   Timeout:  patient name, date of birth, surgical site, and procedure verified Lesion destroyed using liquid nitrogen: Yes   Cryotherapy cycles:  3 Outcome: patient tolerated procedure well with no complications    Cyst below right inner clavicle (tiny black dot with 5 mm dermal papule).  Seborrheic keratoses on the top of the left hand and the left collarbone, left cheek, and middle back.  Several skin tags.  Actinic keratoses on each ear treated with LN2 freeze..  Subtle UV damage  on the upper forehead which we will observe.

## 2020-11-21 ENCOUNTER — Encounter: Payer: Self-pay | Admitting: Dermatology

## 2020-11-29 NOTE — Addendum Note (Signed)
Addended by: Lavonna Monarch on: 11/29/2020 05:19 PM   Modules accepted: Level of Service

## 2020-11-30 ENCOUNTER — Encounter: Payer: Self-pay | Admitting: Family Medicine

## 2020-12-04 ENCOUNTER — Ambulatory Visit (INDEPENDENT_AMBULATORY_CARE_PROVIDER_SITE_OTHER): Payer: Medicare Other | Admitting: *Deleted

## 2020-12-04 ENCOUNTER — Other Ambulatory Visit: Payer: Self-pay

## 2020-12-04 DIAGNOSIS — Z23 Encounter for immunization: Secondary | ICD-10-CM | POA: Diagnosis not present

## 2020-12-10 ENCOUNTER — Emergency Department (HOSPITAL_COMMUNITY)
Admission: EM | Admit: 2020-12-10 | Discharge: 2020-12-10 | Disposition: A | Discharge status: Home / Self Care | Payer: Medicare Other | Attending: Emergency Medicine | Admitting: Emergency Medicine

## 2020-12-10 ENCOUNTER — Telehealth: Payer: Self-pay

## 2020-12-10 ENCOUNTER — Encounter (HOSPITAL_COMMUNITY): Payer: Self-pay | Admitting: Emergency Medicine

## 2020-12-10 ENCOUNTER — Emergency Department (HOSPITAL_COMMUNITY): Payer: Medicare Other

## 2020-12-10 DIAGNOSIS — R4182 Altered mental status, unspecified: Secondary | ICD-10-CM | POA: Diagnosis not present

## 2020-12-10 DIAGNOSIS — Z794 Long term (current) use of insulin: Secondary | ICD-10-CM | POA: Insufficient documentation

## 2020-12-10 DIAGNOSIS — R000 Tachycardia, unspecified: Secondary | ICD-10-CM | POA: Diagnosis not present

## 2020-12-10 DIAGNOSIS — Z7982 Long term (current) use of aspirin: Secondary | ICD-10-CM | POA: Insufficient documentation

## 2020-12-10 DIAGNOSIS — E119 Type 2 diabetes mellitus without complications: Secondary | ICD-10-CM | POA: Diagnosis not present

## 2020-12-10 DIAGNOSIS — Z79899 Other long term (current) drug therapy: Secondary | ICD-10-CM | POA: Diagnosis not present

## 2020-12-10 DIAGNOSIS — I10 Essential (primary) hypertension: Secondary | ICD-10-CM | POA: Insufficient documentation

## 2020-12-10 DIAGNOSIS — R413 Other amnesia: Secondary | ICD-10-CM | POA: Diagnosis not present

## 2020-12-10 DIAGNOSIS — Z7984 Long term (current) use of oral hypoglycemic drugs: Secondary | ICD-10-CM | POA: Insufficient documentation

## 2020-12-10 LAB — CBC WITH DIFFERENTIAL/PLATELET
Abs Immature Granulocytes: 0.03 10*3/uL (ref 0.00–0.07)
Basophils Absolute: 0.1 10*3/uL (ref 0.0–0.1)
Basophils Relative: 1 %
Eosinophils Absolute: 0.1 10*3/uL (ref 0.0–0.5)
Eosinophils Relative: 1 %
HCT: 41.6 % (ref 39.0–52.0)
Hemoglobin: 13.9 g/dL (ref 13.0–17.0)
Immature Granulocytes: 1 %
Lymphocytes Relative: 19 %
Lymphs Abs: 1.3 10*3/uL (ref 0.7–4.0)
MCH: 30.3 pg (ref 26.0–34.0)
MCHC: 33.4 g/dL (ref 30.0–36.0)
MCV: 90.6 fL (ref 80.0–100.0)
Monocytes Absolute: 0.4 10*3/uL (ref 0.1–1.0)
Monocytes Relative: 6 %
Neutro Abs: 4.8 10*3/uL (ref 1.7–7.7)
Neutrophils Relative %: 72 %
Platelets: 275 10*3/uL (ref 150–400)
RBC: 4.59 MIL/uL (ref 4.22–5.81)
RDW: 13.2 % (ref 11.5–15.5)
WBC: 6.6 10*3/uL (ref 4.0–10.5)
nRBC: 0 % (ref 0.0–0.2)

## 2020-12-10 LAB — URINALYSIS, COMPLETE (UACMP) WITH MICROSCOPIC
Bacteria, UA: NONE SEEN
Bilirubin Urine: NEGATIVE
Glucose, UA: NEGATIVE mg/dL
Hgb urine dipstick: NEGATIVE
Ketones, ur: NEGATIVE mg/dL
Nitrite: NEGATIVE
Protein, ur: NEGATIVE mg/dL
Specific Gravity, Urine: 1.015 (ref 1.005–1.030)
pH: 6 (ref 5.0–8.0)

## 2020-12-10 LAB — RAPID URINE DRUG SCREEN, HOSP PERFORMED
Amphetamines: NOT DETECTED
Barbiturates: NOT DETECTED
Benzodiazepines: NOT DETECTED
Cocaine: NOT DETECTED
Opiates: NOT DETECTED
Tetrahydrocannabinol: NOT DETECTED

## 2020-12-10 LAB — COMPREHENSIVE METABOLIC PANEL
ALT: 17 U/L (ref 0–44)
AST: 20 U/L (ref 15–41)
Albumin: 3.8 g/dL (ref 3.5–5.0)
Alkaline Phosphatase: 77 U/L (ref 38–126)
Anion gap: 7 (ref 5–15)
BUN: 21 mg/dL (ref 8–23)
CO2: 27 mmol/L (ref 22–32)
Calcium: 9 mg/dL (ref 8.9–10.3)
Chloride: 100 mmol/L (ref 98–111)
Creatinine, Ser: 0.8 mg/dL (ref 0.61–1.24)
GFR, Estimated: >60 mL/min (ref >60)
Glucose, Bld: 155 mg/dL — ABNORMAL HIGH (ref 70–99)
Potassium: 4.1 mmol/L (ref 3.5–5.1)
Sodium: 134 mmol/L — ABNORMAL LOW (ref 135–145)
Total Bilirubin: 0.6 mg/dL (ref 0.3–1.2)
Total Protein: 6.7 g/dL (ref 6.5–8.1)

## 2020-12-10 LAB — MAGNESIUM: Magnesium: 2.1 mg/dL (ref 1.7–2.4)

## 2020-12-10 LAB — AMMONIA: Ammonia: 21 umol/L (ref 9–35)

## 2020-12-10 LAB — CBG MONITORING, ED: Glucose-Capillary: 156 mg/dL — ABNORMAL HIGH (ref 70–99)
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
## 2020-12-10 NOTE — Discharge Instructions (Addendum)
Your CT scan and your labs and urinalysis were unremarkable today.   You can see a neurologist for follow-up.  Hold off on driving until you are cleared by neurology  Return to ER if you have another episode of memory loss, seizure activity, lethargy, chest pain, passing out
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
## 2020-12-10 NOTE — ED Notes (Signed)
Blue and Gold top tubes sent to lab. 

## 2020-12-10 NOTE — Telephone Encounter (Signed)
Pt went to ER @ [facility].

## 2020-12-15 ENCOUNTER — Other Ambulatory Visit: Payer: Self-pay

## 2020-12-15 ENCOUNTER — Ambulatory Visit (INDEPENDENT_AMBULATORY_CARE_PROVIDER_SITE_OTHER): Payer: Medicare Other | Admitting: Family Medicine

## 2020-12-15 ENCOUNTER — Encounter: Payer: Self-pay | Admitting: Family Medicine

## 2020-12-15 VITALS — BP 103/64 | HR 98 | Temp 98.2°F | Ht 71.0 in | Wt 175.2 lb

## 2020-12-15 DIAGNOSIS — G459 Transient cerebral ischemic attack, unspecified: Secondary | ICD-10-CM

## 2020-12-15 DIAGNOSIS — R413 Other amnesia: Secondary | ICD-10-CM | POA: Diagnosis not present

## 2020-12-15 DIAGNOSIS — I152 Hypertension secondary to endocrine disorders: Secondary | ICD-10-CM | POA: Diagnosis not present

## 2020-12-15 DIAGNOSIS — E1159 Type 2 diabetes mellitus with other circulatory complications: Secondary | ICD-10-CM | POA: Diagnosis not present

## 2020-12-15 MED ORDER — HYDROCHLOROTHIAZIDE 12.5 MG PO TABS: 6.2500 mg | ORAL_TABLET | Freq: Every day | ORAL | 0 refills | Status: DC
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
## 2020-12-15 NOTE — Patient Instructions (Signed)
It was very nice to see you today!  Please decrease your hydrochlorothiazide to 6.25 mg twice daily.  This is half of your current dose.  We will check an MRI and echocardiogram to look for possible TIA.  I would like for you also to follow-up with the neurologist.  Please send me message in a week or so to let me know how your blood pressures are looking.  Take care, Dr [name]  PLEASE NOTE:  If you had any lab tests please let us know if you have not heard back within a few days. You may see your results on mychart before we have a chance to review them but we will give you a call once they are reviewed by [place]. If we ordered any referrals today, please let us know if you have not heard from their office within the next week.   Please try these tips to maintain a healthy lifestyle:  Eat at least 3 REAL meals and 1-2 snacks per day.  Aim for no more than 5 hours between eating.  If you eat breakfast, please do so within one hour of getting up.   Each meal should contain half fruits/vegetables, one quarter protein, and one quarter carbs (no bigger than a computer mouse)  Cut down on sweet beverages. This includes juice, soda, and sweet tea.   Drink at least 1 glass of water with each meal and aim for at least 8 glasses per day  Exercise at least 150 minutes every week.

## 2020-12-15 NOTE — Assessment & Plan Note (Addendum)
Positive orthostatic vitals today.  Doubt that this is contributing to his recent neurologic episode, however we will decrease his HCTZ to 6.25mg  daily due to his vitals today.  He is reluctant to stop fully due to past history of labile blood pressures off of any medications.  He will check in with me in 1 to 2 weeks via MyChart to let me know how his home readings are looking.  We will be checking echocardiogram for TIA work-up as above.

## 2020-12-16 ENCOUNTER — Encounter: Payer: Self-pay | Admitting: Neurology

## 2020-12-16 ENCOUNTER — Telehealth: Payer: Self-pay

## 2020-12-16 NOTE — Telephone Encounter (Signed)
Imaging has been reordered to GI

## 2020-12-16 NOTE — Addendum Note (Signed)
Addended by: Doran Clay A on: 12/16/2020 10:27 AM   Modules accepted: Orders

## 2020-12-16 NOTE — Telephone Encounter (Signed)
Cindy from [facility] called stating that Dr [name] put in an order for an MRI and [facility] does not do MRIs. Jenny Reichmann can be reached at [phone]. Please Advise.

## 2020-12-18 ENCOUNTER — Encounter: Payer: Self-pay | Admitting: Family Medicine

## 2020-12-28 ENCOUNTER — Other Ambulatory Visit: Payer: Self-pay | Admitting: Family Medicine

## 2020-12-29 NOTE — Telephone Encounter (Signed)
Can we make sure this wasn't supposed to go to his endocrinologist?

## 2020-12-29 NOTE — Telephone Encounter (Signed)
Last Prescribed by historical provider

## 2021-01-03 ENCOUNTER — Ambulatory Visit
Admission: RE | Admit: 2021-01-03 | Discharge: 2021-01-03 | Disposition: A | Discharge status: Home / Self Care | Payer: Medicare Other | Source: Ambulatory Visit | Attending: Family Medicine | Admitting: Family Medicine

## 2021-01-03 DIAGNOSIS — G459 Transient cerebral ischemic attack, unspecified: Secondary | ICD-10-CM

## 2021-01-04 ENCOUNTER — Telehealth: Payer: Self-pay

## 2021-01-04 NOTE — Telephone Encounter (Signed)
Patient is calling in stating that he has not heard from anyone to get the Echocardiogram scheduled. I spoke with Lattie Haw on Friday who informed me that she wasn't notified about the order but would call the patient back that day to get something scheduled.

## 2021-01-04 NOTE — Progress Notes (Signed)
NEUROLOGY CONSULTATION NOTE  Scott Herman MRN: [id number] DOB: 08/19/1933  Referring provider: Dimas Chyle, MD Primary care provider: Dimas Chyle, MD  Reason for consult:  Transient global amnesia  Assessment/Plan:   Probable transient global amnesia, however atypical presentation due to brief duration.  MRI of brain unremarkable.  Semilogy not convincing for TIA in absence of lateralizing symptoms.  Low suspicion for seizure, but must be evaluated.  1  Routine EEG - further recommendations pending results. 2  If the semiology was more of a typical presentation of TGA, I would tell him it would be okay to drive.  However, due to the atypical presentation, I recommend no driving for 6 months. 3  Follow up in 6 months or sooner if needed.   Subjective:  Scott Herman is an 87 year old male with DM II, HTN, HLD and GERD who presents for transient global amnesia.  History supplemented by ED and referring provider's notes.  CT head and MRI/MRA of brain personally reviewed.  He is accompanied by his wife who provides collateral history.  On 12/09/2020, he was driving home from dinner and Bible study at there church when he suddenly lost track of time for about 5 minutes.  During that period, he was conversing with his wife normally.  He stayed in his lane.  But his wife became concern when he suddenly asked if he had missed a turn to go back home.  He did not exhibit slurred speech, facial droop, abnormal movements, incontinence or loss of consciousness.  He felt fine prior and after the event.  The next day, he went to the ED for further evaluation.  CT head and labs were unremarkable.  He followed up with his PCP who ordered further testing.  MRI and MRA of brain on 01/03/2021 personally reviewed showed no stroke or large vessel occlusion or hemodynamically significant stenosis.  He has no prior history of similar events and has not had any recurrent event.  He denies history of syncope  or seizure.  When he was a child, he fell backwards and hit the back of his head, but otherwise no history of head trauma.       PAST MEDICAL HISTORY: Past Medical History:  Diagnosis Date   Abnormal stress electrocardiogram test Jan 2013   Negative cardiac catheterization.    Diabetes mellitus, type 2 ([place])    Dr Altheimer   Diverticulosis    GERD (gastroesophageal reflux disease)    Hiatal hernia    Hyperlipidemia    Hypertension    Normal cardiac stress test Jan 2009   Normal ETT   Palpitations    Negative holter in 2009 except for tachycardia with bicycling    PAST SURGICAL HISTORY: Past Surgical History:  Procedure Laterality Date   CARDIAC CATHETERIZATION  Feb 2013   Mild nonobstructive CAD. Normal LV function   CHOLECYSTECTOMY     COLONOSCOPY     Dr Fuller Plan   COLONOSCOPY WITH PROPOFOL N/A 12/22/2016   Procedure: COLONOSCOPY WITH PROPOFOL;  Surgeon: Gatha Mayer, MD;  Location: [facility];  Service: Endoscopy;  Laterality: N/A;   HEMORRHOID SURGERY     INGUINAL HERNIA REPAIR     ORIF CLAVICULAR FRACTURE     left   TONSILLECTOMY      MEDICATIONS: Current Outpatient Medications on File Prior to Visit  Medication Sig Dispense Refill   acetaminophen (TYLENOL) 650 MG CR tablet Take 1,300 mg by mouth every 8 (eight) hours as needed for  pain.     Ascorbic Acid (VITAMIN C) 1000 MG tablet Take 1,000 mg by mouth daily.     aspirin 81 MG tablet Take 1 tablet (81 mg total) by mouth at bedtime. 30 tablet    B-D UF III MINI PEN NEEDLES 31G X 5 MM MISC SMARTSIG:1 Needle SUB-Q Daily     Blood Glucose Calibration (OT ULTRA/FASTTK CNTRL SOLN) SOLN Use to test blood sugars (Dx: E11.9)     ciclopirox (PENLAC) 8 % solution Apply topically at bedtime. Apply over nail and surrounding skin. Apply daily over previous coat. Remove weekly with file or polish remover. 6.6 mL 0   CINNAMON PO Take 2,000 mg by mouth at bedtime.     co-enzyme Q-10 30 MG capsule Take 30 mg by mouth daily.      doxazosin (CARDURA) 4 MG tablet Take 1 tablet by mouth once daily 90 tablet 1   esomeprazole (NEXIUM) 40 MG capsule Take 1 capsule (40 mg total) by mouth daily before breakfast. 90 capsule 1   glucose blood (ONETOUCH VERIO) test strip Use to test blood sugars 4 times a day or as directed (Dx: E11.65)     hydrochlorothiazide (HYDRODIURIL) 12.5 MG tablet Take 0.5 tablets (6.25 mg total) by mouth daily. 90 tablet 0   Insulin Glargine (TOUJEO SOLOSTAR [place]) Inject 24 Units into the skin every morning.      metFORMIN (GLUCOPHAGE-XR) 750 MG 24 hr tablet Take 750 mg by mouth 2 (two) times daily.     Multiple Vitamins-Minerals (MENS 50+ MULTI VITAMIN/MIN) TABS Take 1 tablet by mouth daily.     pioglitazone (ACTOS) 45 MG tablet Take 45 mg by mouth daily.     pravastatin (PRAVACHOL) 80 MG tablet Take 80 mg by mouth at bedtime.     No current facility-administered medications on file prior to visit.    ALLERGIES: No Known Allergies  FAMILY HISTORY: Family History  Problem Relation Age of Onset   Prostate cancer Father    Diabetes Father    Diverticulosis Father    Hypertension Brother    Diabetes Brother    Prostate cancer Paternal Grandfather    Diabetes Paternal Grandmother    Colon cancer Neg Hx     Objective:  Blood pressure 127/66, pulse (!) 101, height 5\' 10"  (1.778 m), weight 176 lb 6.4 oz (80 kg), SpO2 95 %. General: No acute distress.  Patient appears well-groomed.   Head:  Normocephalic/atraumatic Eyes:  fundi examined but not visualized Neck: supple, no paraspinal tenderness, full range of motion Back: No paraspinal tenderness Heart: regular rate and rhythm Lungs: Clear to auscultation bilaterally. Vascular: No carotid bruits. Neurological Exam: Mental status: alert and oriented to person, place, and time, recent and remote memory intact, fund of knowledge intact, attention and concentration intact, speech fluent and not dysarthric, language intact. Cranial nerves: CN I:  not tested CN II: pupils equal, round and reactive to light, visual fields intact CN III, IV, VI:  full range of motion, no nystagmus, no ptosis CN V: facial sensation intact. CN VII: upper and lower face symmetric CN VIII: hearing intact CN IX, X: gag intact, uvula midline CN XI: sternocleidomastoid and trapezius muscles intact CN XII: tongue midline Bulk & Tone: normal, no fasciculations. Motor:  muscle strength 5/5 throughout Sensation:  Pinprick, temperature and vibratory sensation intact. Deep Tendon Reflexes:  2+ throughout,  toes downgoing.   Finger to nose testing:  Without dysmetria.   Heel to shin:  Without dysmetria.   Gait:  Normal station and stride.  Romberg negative.    Thank you for allowing me to take part in the care of this patient.  Metta Clines, DO  CC: Dimas Chyle, MD

## 2021-01-05 ENCOUNTER — Encounter: Payer: Self-pay | Admitting: Neurology

## 2021-01-05 ENCOUNTER — Ambulatory Visit: Payer: Medicare Other | Admitting: Neurology

## 2021-01-05 ENCOUNTER — Other Ambulatory Visit: Payer: Self-pay

## 2021-01-05 VITALS — BP 127/66 | HR 101 | Ht 70.0 in | Wt 176.4 lb

## 2021-01-05 DIAGNOSIS — G454 Transient global amnesia: Secondary | ICD-10-CM

## 2021-01-05 NOTE — Patient Instructions (Signed)
We will check an EEG No driving for 6 months from last episode of loss of consciousness or awareness, as per [facility].  That would be June 09, 2021. Follow up in 6 months.

## 2021-01-05 NOTE — Progress Notes (Signed)
Please inform patient of the following:  His MRI did not show any signs of stroke or other serious abnormalities. He does have some expected age related changes.  Do not need to change his treatment plan at this time. Would like for him to follow up with neurology as we discussed at his office visit.

## 2021-01-07 NOTE — Telephone Encounter (Signed)
Notified patient referral coordinator is working on referral we will call him once ready for scheduling. Patient voices understanding.

## 2021-01-12 ENCOUNTER — Telehealth: Payer: Self-pay | Admitting: *Deleted

## 2021-01-12 NOTE — Telephone Encounter (Signed)
.  patient stated have not received ECHO appt yet  Can you check on this

## 2021-01-15 ENCOUNTER — Ambulatory Visit (INDEPENDENT_AMBULATORY_CARE_PROVIDER_SITE_OTHER): Payer: Medicare Other

## 2021-01-15 ENCOUNTER — Other Ambulatory Visit: Payer: Self-pay

## 2021-01-15 VITALS — BP 112/62 | HR 95 | Temp 98.7°F | Wt 171.6 lb

## 2021-01-15 DIAGNOSIS — Z0000 Encounter for general adult medical examination without abnormal findings: Secondary | ICD-10-CM

## 2021-01-15 NOTE — Patient Instructions (Signed)
Mr. Scott Herman , Thank you for taking time to come for your Medicare Wellness Visit. I appreciate your ongoing commitment to your health goals. Please review the following plan we discussed and let me know if I can assist you in the future.   Screening recommendations/referrals: Colonoscopy: No longer required  Recommended yearly ophthalmology/optometry visit for glaucoma screening and checkup Recommended yearly dental visit for hygiene and checkup  Vaccinations: Influenza vaccine: Done 12/04/20 Pneumococcal vaccine: Up to date Tdap vaccine: Due and discussed Shingles vaccine: Shingrix discussed. Please contact your pharmacy for coverage information.    Covid-19: Completed 1/20, 2/10, & 11/29/19  Advanced directives: Please bring a copy of your health care power of attorney and living will to the office at your convenience.  Conditions/risks identified: Get more exercise in   Next appointment: Follow up in one year for your annual wellness visit.   Preventive Care 85 Years and Older, Male Preventive care refers to lifestyle choices and visits with your health care provider that can promote health and wellness. What does preventive care include? A yearly physical exam. This is also called an annual well check. Dental exams once or twice a year. Routine eye exams. Ask your health care provider how often you should have your eyes checked. Personal lifestyle choices, including: Daily care of your teeth and gums. Regular physical activity. Eating a healthy diet. Avoiding tobacco and drug use. Limiting alcohol use. Practicing safe sex. Taking low doses of aspirin every day. Taking vitamin and mineral supplements as recommended by your health care provider. What happens during an annual well check? The services and screenings done by your health care provider during your annual well check will depend on your age, overall health, lifestyle risk factors, and family history of  disease. Counseling  Your health care provider may ask you questions about your: Alcohol use. Tobacco use. Drug use. Emotional well-being. Home and relationship well-being. Sexual activity. Eating habits. History of falls. Memory and ability to understand (cognition). Work and work Statistician. Screening  You may have the following tests or measurements: Height, weight, and BMI. Blood pressure. Lipid and cholesterol levels. These may be checked every 5 years, or more frequently if you are over 85 years old. Skin check. Lung cancer screening. You may have this screening every year starting at age 85 if you have a 30-pack-year history of smoking and currently smoke or have quit within the past 15 years. Fecal occult blood test (FOBT) of the stool. You may have this test every year starting at age 85. Flexible sigmoidoscopy or colonoscopy. You may have a sigmoidoscopy every 5 years or a colonoscopy every 85 years starting at age 85. Prostate cancer screening. Recommendations will vary depending on your family history and other risks. Hepatitis C blood test. Hepatitis B blood test. Sexually transmitted disease (STD) testing. Diabetes screening. This is done by checking your blood sugar (glucose) after you have not eaten for a while (fasting). You may have this done every 1-3 years. Abdominal aortic aneurysm (AAA) screening. You may need this if you are a current or former smoker. Osteoporosis. You may be screened starting at age 85 if you are at high risk. Talk with your health care provider about your test results, treatment options, and if necessary, the need for more tests. Vaccines  Your health care provider may recommend certain vaccines, such as: Influenza vaccine. This is recommended every year. Tetanus, diphtheria, and acellular pertussis (Tdap, Td) vaccine. You may need a Td booster every 10 years.  Zoster vaccine. You may need this after age 85. Pneumococcal 13-valent  conjugate (PCV13) vaccine. One dose is recommended after age 85. Pneumococcal polysaccharide (PPSV23) vaccine. One dose is recommended after age 85. Talk to your health care provider about which screenings and vaccines you need and how often you need them. This information is not intended to replace advice given to you by your health care provider. Make sure you discuss any questions you have with your health care provider. Document Released: 03/13/2015 Document Revised: 11/04/2015 Document Reviewed: 12/16/2014 Elsevier Interactive Patient Education  2017 [place] Prevention in the Home Falls can cause injuries. They can happen to people of all ages. There are many things you can do to make your home safe and to help prevent falls. What can I do on the outside of my home? Regularly fix the edges of walkways and driveways and fix any cracks. Remove anything that might make you trip as you walk through a door, such as a raised step or threshold. Trim any bushes or trees on the path to your home. Use bright outdoor lighting. Clear any walking paths of anything that might make someone trip, such as rocks or tools. Regularly check to see if handrails are loose or broken. Make sure that both sides of any steps have handrails. Any raised decks and porches should have guardrails on the edges. Have any leaves, snow, or ice cleared regularly. Use sand or salt on walking paths during winter. Clean up any spills in your garage right away. This includes oil or grease spills. What can I do in the bathroom? Use night lights. Install grab bars by the toilet and in the tub and shower. Do not use towel bars as grab bars. Use non-skid mats or decals in the tub or shower. If you need to sit down in the shower, use a plastic, non-slip stool. Keep the floor dry. Clean up any water that spills on the floor as soon as it happens. Remove soap buildup in the tub or shower regularly. Attach bath mats  securely with double-sided non-slip rug tape. Do not have throw rugs and other things on the floor that can make you trip. What can I do in the bedroom? Use night lights. Make sure that you have a light by your bed that is easy to reach. Do not use any sheets or blankets that are too big for your bed. They should not hang down onto the floor. Have a firm chair that has side arms. You can use this for support while you get dressed. Do not have throw rugs and other things on the floor that can make you trip. What can I do in the kitchen? Clean up any spills right away. Avoid walking on wet floors. Keep items that you use a lot in easy-to-reach places. If you need to reach something above you, use a strong step stool that has a grab bar. Keep electrical cords out of the way. Do not use floor polish or wax that makes floors slippery. If you must use wax, use non-skid floor wax. Do not have throw rugs and other things on the floor that can make you trip. What can I do with my stairs? Do not leave any items on the stairs. Make sure that there are handrails on both sides of the stairs and use them. Fix handrails that are broken or loose. Make sure that handrails are as long as the stairways. Check any carpeting to make sure that  it is firmly attached to the stairs. Fix any carpet that is loose or worn. Avoid having throw rugs at the top or bottom of the stairs. If you do have throw rugs, attach them to the floor with carpet tape. Make sure that you have a light switch at the top of the stairs and the bottom of the stairs. If you do not have them, ask someone to add them for you. What else can I do to help prevent falls? Wear shoes that: Do not have high heels. Have rubber bottoms. Are comfortable and fit you well. Are closed at the toe. Do not wear sandals. If you use a stepladder: Make sure that it is fully opened. Do not climb a closed stepladder. Make sure that both sides of the stepladder  are locked into place. Ask someone to hold it for you, if possible. Clearly mark and make sure that you can see: Any grab bars or handrails. First and last steps. Where the edge of each step is. Use tools that help you move around (mobility aids) if they are needed. These include: Canes. Walkers. Scooters. Crutches. Turn on the lights when you go into a dark area. Replace any light bulbs as soon as they burn out. Set up your furniture so you have a clear path. Avoid moving your furniture around. If any of your floors are uneven, fix them. If there are any pets around you, be aware of where they are. Review your medicines with your doctor. Some medicines can make you feel dizzy. This can increase your chance of falling. Ask your doctor what other things that you can do to help prevent falls. This information is not intended to replace advice given to you by your health care provider. Make sure you discuss any questions you have with your health care provider. Document Released: 12/11/2008 Document Revised: 07/23/2015 Document Reviewed: 03/21/2014 Elsevier Interactive Patient Education  2017 [employer].

## 2021-01-15 NOTE — Progress Notes (Signed)
Subjective:   Scott Herman is a 87 y.o. male who presents for Medicare Annual/Subsequent preventive examination.  Review of Systems     Cardiac Risk Factors include: advanced age (>19men, >95 women);male gender;hypertension;dyslipidemia;diabetes mellitus     Objective:    Today's Vitals   01/15/21 0812  BP: 112/62  Pulse: 95  Temp: 98.7 F (37.1 C)  SpO2: 95%  Weight: 171 lb 9.6 oz (77.8 kg)   Body mass index is 24.62 kg/m.  Advanced Directives 01/15/2021 01/05/2021 12/10/2020 01/30/2020 11/28/2019 02/10/2019 11/09/2018  Does Patient Have a Medical Advance Directive? Yes Yes No No Yes Yes Yes  Type of Advance Directive [place];Living will [place];Living will [place]  Does patient want to make changes to medical advance directive? - - - - - - No - Patient declined  Copy of [place] in Chart? No - copy requested - - - No - copy requested - No - copy requested  Would patient like information on creating a medical advance directive? - - - No - Patient declined - - -  Pre-existing out of facility DNR order (yellow form or pink MOST form) - - - - - - -    Current Medications (verified) Outpatient Encounter Medications as of 01/15/2021  Medication Sig   acetaminophen (TYLENOL) 650 MG CR tablet Take 1,300 mg by mouth every 8 (eight) hours as needed for pain.   Ascorbic Acid (VITAMIN C) 1000 MG tablet Take 1,000 mg by mouth daily.   aspirin 81 MG tablet Take 1 tablet (81 mg total) by mouth at bedtime.   B-D UF III MINI PEN NEEDLES 31G X 5 MM MISC SMARTSIG:1 Needle SUB-Q Daily   Blood Glucose Calibration (OT ULTRA/FASTTK CNTRL SOLN) SOLN Use to test blood sugars (Dx: E11.9)   CINNAMON PO Take 2,000 mg by mouth at bedtime.   co-enzyme Q-10 30 MG capsule Take 30 mg by mouth daily.   Continuous Blood Gluc Receiver (FREESTYLE LIBRE 2 READER) DEVI SCAN AS NEEDED FOR  CONTINUOUS GLUCOSE MONITORING   doxazosin (CARDURA) 4 MG tablet Take 1 tablet by mouth once daily   esomeprazole (NEXIUM) 40 MG capsule Take 1 capsule (40 mg total) by mouth daily before breakfast.   glucose blood (ONETOUCH VERIO) test strip Use to test blood sugars 4 times a day or as directed (Dx: E11.65)   hydrochlorothiazide (HYDRODIURIL) 12.5 MG tablet Take 0.5 tablets (6.25 mg total) by mouth daily.   Insulin Glargine (TOUJEO SOLOSTAR Pax) Inject 24 Units into the skin every morning.    metFORMIN (GLUCOPHAGE-XR) 750 MG 24 hr tablet Take 750 mg by mouth 2 (two) times daily.   Multiple Vitamins-Minerals (MENS 50+ MULTI VITAMIN/MIN) TABS Take 1 tablet by mouth daily.   pioglitazone (ACTOS) 45 MG tablet Take 45 mg by mouth daily.   pravastatin (PRAVACHOL) 80 MG tablet Take 80 mg by mouth at bedtime.   ciclopirox (PENLAC) 8 % solution Apply topically at bedtime. Apply over nail and surrounding skin. Apply daily over previous coat. Remove weekly with file or polish remover. (Patient not taking: Reported on 01/15/2021)   No facility-administered encounter medications on file as of 01/15/2021.    Allergies (verified) Patient has no known allergies.   History: Past Medical History:  Diagnosis Date   Abnormal stress electrocardiogram test Jan 2013   Negative cardiac catheterization.    Diabetes mellitus, type 2 ([place])  Dr Altheimer   Diverticulosis    GERD (gastroesophageal reflux disease)    Hiatal hernia    Hyperlipidemia    Hypertension    Normal cardiac stress test Jan 2009   Normal ETT   Palpitations    Negative holter in 2009 except for tachycardia with bicycling   Past Surgical History:  Procedure Laterality Date   CARDIAC CATHETERIZATION  Feb 2013   Mild nonobstructive CAD. Normal LV function   CHOLECYSTECTOMY     COLONOSCOPY     Dr Fuller Plan   COLONOSCOPY WITH PROPOFOL N/A 12/22/2016   Procedure: COLONOSCOPY WITH PROPOFOL;  Surgeon: Gatha Mayer, MD;  Location: [facility]  ENDOSCOPY;  Service: Endoscopy;  Laterality: N/A;   HEMORRHOID SURGERY     INGUINAL HERNIA REPAIR     ORIF CLAVICULAR FRACTURE     left   TONSILLECTOMY     Family History  Problem Relation Age of Onset   Prostate cancer Father    Diabetes Father    Diverticulosis Father    Hypertension Brother    Diabetes Brother    Prostate cancer Paternal Grandfather    Diabetes Paternal Grandmother    Colon cancer Neg Hx    Social History   Socioeconomic History   Marital status: Married    Spouse name: Not on file   Number of children: Not on file   Years of education: Not on file   Highest education level: Not on file  Occupational History   Occupation: Retired  Tobacco Use   Smoking status: Never   Smokeless tobacco: Never  Vaping Use   Vaping Use: Never used  Substance and Sexual Activity   Alcohol use: No    Alcohol/week: 0.0 standard drinks   Drug use: No   Sexual activity: Yes  Other Topics Concern   Not on file  Social History Narrative   Not on file   Social Determinants of Health   Financial Resource Strain: Low Risk    Difficulty of Paying Living Expenses: Not hard at all  Food Insecurity: No Food Insecurity   Worried About Charity fundraiser in the Last Year: Never true   [place] in the Last Year: Never true  Transportation Needs: No Transportation Needs   Lack of Transportation (Medical): No   Lack of Transportation (Non-Medical): No  Physical Activity: Inactive   Days of Exercise per Week: 0 days   Minutes of Exercise per Session: 0 min  Stress: No Stress Concern Present   Feeling of Stress : Not at all  Social Connections: Moderately Integrated   Frequency of Communication with Friends and Family: More than three times a week   Frequency of Social Gatherings with Friends and Family: Once a week   Attends Religious Services: More than 4 times per year   Active Member of [employer] or Organizations: No   Attends Music therapist: Never    Marital Status: Married    Tobacco Counseling Counseling given: Not Answered   Clinical Intake:  Pre-visit preparation completed: Yes  Pain : No/denies pain     BMI - recorded: 24.62 Nutritional Status: BMI of 19-24  Normal Nutritional Risks: None Diabetes: Yes CBG done?: Yes (140) CBG resulted in Enter/ Edit results?: No Did pt. bring in CBG monitor from home?: No  How often do you need to have someone help you when you read instructions, pamphlets, or other written materials from your doctor or pharmacy?: 1 - Never  Diabetic?Nutrition Risk Assessment:  Has the patient had any N/V/D within the last 2 months?  No  Does the patient have any non-healing wounds?  No  Has the patient had any unintentional weight loss or weight gain?  No   Diabetes:  Is the patient diabetic?  Yes  If diabetic, was a CBG obtained today?  Yes  Did the patient bring in their glucometer from home?  No  How often do you monitor your CBG's? Daily .   Financial Strains and Diabetes Management:  Are you having any financial strains with the device, your supplies or your medication? No .  Does the patient want to be seen by Chronic Care Management for management of their diabetes?  No  Would the patient like to be referred to a Nutritionist or for Diabetic Management?  No   Diabetic Exams:  Diabetic Eye Exam: Overdue for diabetic eye exam. Pt has been advised about the importance in completing this exam. Patient advised to call and schedule an eye exam. Diabetic foot exams Pt stated completed 3 months ago with endocrinologist    Interpreter Needed?: No  Information entered by :: Charlott Rakes, LPN   Activities of Daily Living In your present state of health, do you have any difficulty performing the following activities: 01/15/2021  Hearing? N  Vision? N  Difficulty concentrating or making decisions? N  Walking or climbing stairs? N  Dressing or bathing? N  Doing errands, shopping? N   Preparing Food and eating ? N  Using the Toilet? N  In the past six months, have you accidently leaked urine? N  Do you have problems with loss of bowel control? N  Managing your Medications? N  Managing your Finances? N  Housekeeping or managing your Housekeeping? N  Some recent data might be hidden    Patient Care Team: Vivi Barrack, MD as PCP - General (Family Medicine) Festus Aloe, MD as Consulting Physician (Urology) Minus Breeding, MD as Consulting Physician (Cardiology) Ladene Artist, MD as Consulting Physician (Gastroenterology) Altheimer, Legrand Como, MD as Consulting Physician (Endocrinology) Hayden Pedro, MD as Consulting Physician (Ophthalmology) Manson Passey, [place] (Specialist) Lavonna Monarch, MD as Consulting Physician (Dermatology)  Indicate any recent Medical Services you may have received from other than [facility] providers in the past year (date may be approximate).     Assessment:   This is a routine wellness examination for Dia.  Hearing/Vision screen Hearing Screening - Comments:: Pt denies any hearing issues  Vision Screening - Comments:: Pt follows up with Dr Zigmund Daniel for annual eye exams   Dietary issues and exercise activities discussed: Current Exercise Habits: The patient does not participate in regular exercise at present   Goals Addressed             This Visit's Progress    Patient Stated       More exercise        Depression Screen PHQ 2/9 Scores 01/15/2021 10/06/2020 11/28/2019 01/22/2019 11/09/2018 10/27/2017 02/18/2016  PHQ - 2 Score 0 0 0 0 0 0 0    Fall Risk Fall Risk  01/15/2021 01/05/2021 11/28/2019 01/22/2019 11/09/2018  Falls in the past year? 0 1 0 0 0  Number falls in past yr: 0 0 0 - 0  Injury with Fall? 0 0 0 - 0  Risk for fall due to : Impaired vision - - - -  Follow up Falls prevention discussed - Falls prevention discussed - Education provided;Falls evaluation completed    FALL RISK PREVENTION PERTAINING TO  THE  HOME:  Any stairs in or around the home? No  If so, are there any without handrails? No  Home free of loose throw rugs in walkways, pet beds, electrical cords, etc? Yes  Adequate lighting in your home to reduce risk of falls? Yes   ASSISTIVE DEVICES UTILIZED TO PREVENT FALLS:  Life alert? No  Use of a cane, walker or w/c? No  Grab bars in the bathroom? Yes  Shower chair or bench in shower? Yes  Elevated toilet seat or a handicapped toilet? No   TIMED UP AND GO:  Was the test performed? No .  Cognitive Function:     6CIT Screen 01/15/2021 11/28/2019 11/09/2018 10/27/2017  What Year? 0 points 0 points 0 points 0 points  What month? 0 points 0 points 0 points 0 points  What time? 0 points - 0 points 0 points  Count back from 20 0 points 0 points 0 points 0 points  Months in reverse 0 points 0 points 0 points 0 points  Repeat phrase 0 points 0 points 0 points -  Total Score 0 - 0 -    Immunizations Immunization History  Administered Date(s) Administered   Fluad Quad(high Dose 65+) 11/09/2018, 12/04/2020   Influenza, High Dose Seasonal PF 01/13/2014, 12/08/2017, 11/09/2018, 11/15/2019   Influenza-Unspecified 12/08/2014, 01/04/2016, 11/28/2016   PFIZER(Purple Top)SARS-COV-2 Vaccination 03/20/2019, 04/10/2019, 11/29/2019   Pneumococcal Conjugate-13 02/18/2016   Zoster, Live 02/26/2010    TDAP status: Due, Education has been provided regarding the importance of this vaccine. Advised may receive this vaccine at local pharmacy or Health Dept. Aware to provide a copy of the vaccination record if obtained from local pharmacy or Health Dept. Verbalized acceptance and understanding.  Flu Vaccine status: Up to date  Pneumococcal vaccine status: Due, Education has been provided regarding the importance of this vaccine. Advised may receive this vaccine at local pharmacy or Health Dept. Aware to provide a copy of the vaccination record if obtained from local pharmacy or Health Dept.  Verbalized acceptance and understanding.  Covid-19 vaccine status: Completed vaccines  Qualifies for Shingles Vaccine? Yes   Zostavax completed No   Shingrix Completed?: No.    Education has been provided regarding the importance of this vaccine. Patient has been advised to call insurance company to determine out of pocket expense if they have not yet received this vaccine. Advised may also receive vaccine at local pharmacy or Health Dept. Verbalized acceptance and understanding.  Screening Tests Health Maintenance  Topic Date Due   TETANUS/TDAP  Never done   Zoster Vaccines- Shingrix (1 of 2) Never done   Pneumonia Vaccine 12+ Years old (2 - PPSV23 if available, else PCV20) 02/17/2017   FOOT EXAM  05/17/2018   HEMOGLOBIN A1C  05/13/2019   OPHTHALMOLOGY EXAM  09/18/2019   COVID-19 Vaccine (4 - Booster for Pfizer series) 01/24/2020   INFLUENZA VACCINE  Completed   HPV VACCINES  Aged Out    Health Maintenance  Health Maintenance Due  Topic Date Due   TETANUS/TDAP  Never done   Zoster Vaccines- Shingrix (1 of 2) Never done   Pneumonia Vaccine 19+ Years old (2 - PPSV23 if available, else PCV20) 02/17/2017   FOOT EXAM  05/17/2018   HEMOGLOBIN A1C  05/13/2019   OPHTHALMOLOGY EXAM  09/18/2019   COVID-19 Vaccine (4 - Booster for [place] series) 01/24/2020    Colorectal cancer screening: No longer required.    Additional Screening:   Vision Screening: Recommended annual ophthalmology exams for early  detection of glaucoma and other disorders of the eye. Is the patient up to date with their annual eye exam?  Yes  Who is the provider or what is the name of the office in which the patient attends annual eye exams? Dr Zigmund Daniel  If pt is not established with a provider, would they like to be referred to a provider to establish care? No .   Dental Screening: Recommended annual dental exams for proper oral hygiene  Community Resource Referral / Chronic Care Management: CRR required  this visit?  No   CCM required this visit?  No      Plan:     I have personally reviewed and noted the following in the patient's chart:   Medical and social history Use of alcohol, tobacco or illicit drugs  Current medications and supplements including opioid prescriptions. Patient is not currently taking opioid prescriptions. Functional ability and status Nutritional status Physical activity Advanced directives List of other physicians Hospitalizations, surgeries, and ER visits in previous 12 months Vitals Screenings to include cognitive, depression, and falls Referrals and appointments  In addition, I have reviewed and discussed with patient certain preventive protocols, quality metrics, and best practice recommendations. A written personalized care plan for preventive services as well as general preventive health recommendations were provided to patient.     Willette Brace, LPN   12/22/8525   Nurse Notes: None

## 2021-01-19 ENCOUNTER — Encounter: Payer: Self-pay | Admitting: Neurology

## 2021-01-19 ENCOUNTER — Other Ambulatory Visit: Payer: Self-pay

## 2021-01-19 ENCOUNTER — Ambulatory Visit: Payer: Medicare Other | Admitting: Neurology

## 2021-01-19 DIAGNOSIS — G454 Transient global amnesia: Secondary | ICD-10-CM

## 2021-01-19 NOTE — Procedures (Signed)
ELECTROENCEPHALOGRAM REPORT  Date of Study: 01/19/2021  Patient's Name: Scott Herman MRN: [id number] Date of Birth: 10-Mar-1933   Clinical History: 87 year old male with transient global amnesia  Medications: TYLENOL 650 MG CR tablet VITAMIN C 1000 MG tablet aspirin 81 MG tablet PENLAC 8 % solution CINNAMON PO co-enzyme Q-10 30 MG capsule CARDURA 4 MG tablet NEXIUM 40 MG capsule HYDRODIURIL 12.5 MG tablet TOUJEO SOLOSTAR [place] GLUCOPHAGE-XR 750 MG 24 hr tablet MENS 50+ MULTI VITAMIN/MIN TABS ACTOS 45 MG tablet PRAVACHOL 80 MG tablet  Technical Summary: A multichannel digital EEG recording measured by the international 10-20 system with electrodes applied with paste and impedances below 5000 ohms performed in our laboratory with EKG monitoring in an awake and drowsy patient.  Photic stimulation was performed.  The digital EEG was referentially recorded, reformatted, and digitally filtered in a variety of bipolar and referential montages for optimal display.    Description: The patient is awake and drowsy during the recording.  During maximal wakefulness, there is a symmetric, medium voltage 9-10 Hz posterior dominant rhythm that attenuates with eye opening.  The record is symmetric.  During drowsiness, there is an increase in theta slowing of the background.  Stage 2 sleep was not seen.  Photic stimulation did not elicit any abnormalities.  There were no epileptiform discharges or electrographic seizures seen.    EKG lead was unremarkable.  Impression: This awake and drowsy EEG is normal.    Clinical Correlation: A normal EEG does not exclude a clinical diagnosis of epilepsy.  If further clinical questions remain, prolonged EEG may be helpful.  Clinical correlation is advised.   Metta Clines, DO

## 2021-01-28 ENCOUNTER — Encounter: Payer: Self-pay | Admitting: Family Medicine

## 2021-01-28 ENCOUNTER — Other Ambulatory Visit: Payer: Self-pay

## 2021-01-28 ENCOUNTER — Ambulatory Visit (INDEPENDENT_AMBULATORY_CARE_PROVIDER_SITE_OTHER): Payer: Medicare Other

## 2021-01-28 DIAGNOSIS — G459 Transient cerebral ischemic attack, unspecified: Secondary | ICD-10-CM

## 2021-01-28 LAB — ECHOCARDIOGRAM COMPLETE
AR max vel: 2.99 cm2
AV Area VTI: 3.3 cm2
AV Area mean vel: 3.14 cm2
AV Mean grad: 4 mmHg
AV Peak grad: 9 mmHg
AV Vena cont: 0.3 cm
Ao pk vel: 1.5 m/s
Area-P 1/2: 3.99 cm2
Calc EF: 58.5 %
MV VTI: 3.16 cm2
P 1/2 time: 529 msec
S' Lateral: 3.26 cm
Single Plane A2C EF: 57.1 %
Single Plane A4C EF: 60.6 %

## 2021-01-29 ENCOUNTER — Encounter: Payer: Self-pay | Admitting: Family Medicine

## 2021-01-29 DIAGNOSIS — I77819 Aortic ectasia, unspecified site: Secondary | ICD-10-CM | POA: Insufficient documentation

## 2021-01-29 NOTE — Telephone Encounter (Signed)
See results note. 

## 2021-01-29 NOTE — Progress Notes (Signed)
Please inform patient of the following:  His echocardiogram is reassuring. He does have mild impaired relaxation and a mild leaky valve.  HE has a small dilation in his aorta. This is not something that should cause any issue but we should check again next year.  Do not need to do any further testing at this time.

## 2021-01-29 NOTE — Telephone Encounter (Signed)
Patient calling to follow up on message below. Would love to get a call back before the weekend.

## 2021-02-15 ENCOUNTER — Encounter: Payer: Self-pay | Admitting: Family Medicine

## 2021-02-15 ENCOUNTER — Telehealth: Payer: Self-pay | Admitting: Family Medicine

## 2021-02-15 ENCOUNTER — Telehealth (INDEPENDENT_AMBULATORY_CARE_PROVIDER_SITE_OTHER): Payer: Medicare Other | Admitting: Family Medicine

## 2021-02-15 DIAGNOSIS — E119 Type 2 diabetes mellitus without complications: Secondary | ICD-10-CM | POA: Diagnosis not present

## 2021-02-15 DIAGNOSIS — I152 Hypertension secondary to endocrine disorders: Secondary | ICD-10-CM

## 2021-02-15 DIAGNOSIS — U071 COVID-19: Secondary | ICD-10-CM | POA: Diagnosis not present

## 2021-02-15 DIAGNOSIS — E1159 Type 2 diabetes mellitus with other circulatory complications: Secondary | ICD-10-CM | POA: Diagnosis not present

## 2021-02-15 MED ORDER — BENZONATATE 200 MG PO CAPS: 200.0000 mg | ORAL_CAPSULE | Freq: Two times a day (BID) | ORAL | 0 refills | Status: DC | PRN

## 2021-02-15 MED ORDER — MOLNUPIRAVIR 200 MG PO CAPS: 4.0000 | ORAL_CAPSULE | Freq: Two times a day (BID) | ORAL | 0 refills | Status: AC

## 2021-02-15 NOTE — Telephone Encounter (Signed)
Can we have patient and his wife schedule a visit if at all possible?  Scott Herman. [name], MD 02/15/2021 10:56 AM

## 2021-02-15 NOTE — Telephone Encounter (Signed)
Please see note.

## 2021-02-15 NOTE — Telephone Encounter (Signed)
Virtual visit scheduled w/ patient for today

## 2021-02-15 NOTE — Telephone Encounter (Signed)
Pt spouse test positive for covid Saturday 12/17. Pt test positive Sunday 12/18 and would like to know if medication could be prescribed. Advised pt appointment may be needed. Please advise.   [place], [place] - [address]  [place] [address]  Phone:  [phone]  Fax:  [phone]

## 2021-02-15 NOTE — Progress Notes (Addendum)
° °  Scott Herman is a 87 y.o. male who presents today for a telephone visit.  Assessment/Plan:  New/Acute Problems: COVID Symptoms are mild however he is at increased risk of complication from COVID due to advanced age and underlying medical history including hypertension and diabetes.  We will start [place].  Also send in [place].  Encouraged hydration.  Can continue over-the-counter meds as needed.  Chronic Problems Addressed Today: Diabetes / Hypertension Under good control with current regimen however increases risk for COVID.  We will continue chronic meds and treat covid as above.      Subjective:  HPI:  Patient with COVID.  Symptoms started 2 days ago.  Wife has been sick with COVID as well.  Predominant symptom at this point is fatigue and weakness.  Minimal cough.  No fever.  No specific treatments tried other than Tessalon.  No chest pain or shortness of breath.       Objective/Observations   NAD, speaking in full sentences.   Telephone Visit   I connected with [name] on 02/15/21 at  4:00 PM EST via telephone and verified that I am speaking with the correct person using two identifiers. I discussed the limitations of evaluation and management by telemedicine and the availability of in person appointments. The patient expressed understanding and agreed to proceed.   Patient location: Home Provider location: [place] participating in the virtual visit: Myself and Patient and his wife.  15 minutes were spent on medical discussion.        [name]. [name], MD 02/15/2021 12:43 PM

## 2021-02-23 ENCOUNTER — Other Ambulatory Visit: Payer: Self-pay | Admitting: Family Medicine

## 2021-02-23 ENCOUNTER — Telehealth: Payer: Medicare Other | Admitting: Family Medicine

## 2021-03-05 ENCOUNTER — Other Ambulatory Visit: Payer: Self-pay | Admitting: Family Medicine

## 2021-03-17 ENCOUNTER — Ambulatory Visit (INDEPENDENT_AMBULATORY_CARE_PROVIDER_SITE_OTHER): Payer: Medicare Other | Admitting: Family Medicine

## 2021-03-17 ENCOUNTER — Other Ambulatory Visit: Payer: Self-pay

## 2021-03-17 ENCOUNTER — Encounter: Payer: Self-pay | Admitting: Family Medicine

## 2021-03-17 VITALS — BP 144/80 | HR 77 | Temp 98.6°F | Ht 71.0 in | Wt 174.4 lb

## 2021-03-17 DIAGNOSIS — I152 Hypertension secondary to endocrine disorders: Secondary | ICD-10-CM

## 2021-03-17 DIAGNOSIS — R350 Frequency of micturition: Secondary | ICD-10-CM

## 2021-03-17 DIAGNOSIS — N401 Enlarged prostate with lower urinary tract symptoms: Secondary | ICD-10-CM

## 2021-03-17 DIAGNOSIS — E1159 Type 2 diabetes mellitus with other circulatory complications: Secondary | ICD-10-CM | POA: Diagnosis not present

## 2021-03-17 DIAGNOSIS — E119 Type 2 diabetes mellitus without complications: Secondary | ICD-10-CM | POA: Diagnosis not present

## 2021-03-17 LAB — POCT URINALYSIS DIPSTICK
Bilirubin, UA: NEGATIVE
Blood, UA: NEGATIVE
Glucose, UA: NEGATIVE
Ketones, UA: NEGATIVE
Leukocytes, UA: NEGATIVE
Nitrite, UA: POSITIVE
Protein, UA: NEGATIVE
Spec Grav, UA: 1.015 (ref 1.010–1.025)
Urobilinogen, UA: 0.2 E.U./dL
pH, UA: 7.5 (ref 5.0–8.0)

## 2021-03-17 NOTE — Assessment & Plan Note (Signed)
Follows with endocrinology. A1cs have been stable without any significant high readings. Do not think this contributing to his urinary frequency.

## 2021-03-17 NOTE — Progress Notes (Signed)
° °  Scott Herman is a 88 y.o. male who presents today for an office visit.  Assessment/Plan:  New/Acute Problems: Urinary Frequency POC UA with positive nitrites.  We will check culture to rule out UTI.  Likely has worsening BPH.  Will increase dose of Cardura as below.  If culture is negative and symptoms are not improving with [name] would consider trial of antispasmodic agent such as Myrbetriq or oxybutynin.  Chronic Problems Addressed Today: No problem-specific Assessment & Plan notes found for this encounter.     Subjective:  HPI:  Patient here with urinary frequency.  He normally wakes up 2-3 times at night to urinate.  This is been going on for quite a while and he attributes this to his BPH however 2 nights ago he started waking up every 45 minutes to an hour with urination.  He has increased urgency.  He has had some constipation which he thinks is contributing.  No reported dysuria.  No fevers or chills.  See A/P for status of chronic conditions.      Objective:  Physical Exam: BP (!) 144/80    Pulse 77    Temp 98.6 F (37 C) (Temporal)    Ht 5\' 11"  (1.803 m)    Wt 174 lb 6.4 oz (79.1 kg)    SpO2 97%    BMI 24.32 kg/m   Gen: No acute distress, resting comfortably Neuro: Grossly normal, moves all extremities Psych: Normal affect and thought content      Scott Herman M. [name], MD 03/17/2021 11:43 AM

## 2021-03-17 NOTE — Patient Instructions (Signed)
It was very nice to see you today!  Please increase your cardura to 8mg  daily.  We will check to make sure you dont have a uti.  Please let me know if your symptoms are not improving.   Take care, Dr [name]  PLEASE NOTE:  If you had any lab tests please let us know if you have not heard back within a few days. You may see your results on mychart before we have a chance to review them but we will give you a call once they are reviewed by [place]. If we ordered any referrals today, please let us know if you have not heard from their office within the next week.   Please try these tips to maintain a healthy lifestyle:  Eat at least 3 REAL meals and 1-2 snacks per day.  Aim for no more than 5 hours between eating.  If you eat breakfast, please do so within one hour of getting up.   Each meal should contain half fruits/vegetables, one quarter protein, and one quarter carbs (no bigger than a computer mouse)  Cut down on sweet beverages. This includes juice, soda, and sweet tea.   Drink at least 1 glass of water with each meal and aim for at least 8 glasses per day  Exercise at least 150 minutes every week.

## 2021-03-17 NOTE — Assessment & Plan Note (Addendum)
Likely contributing to his urinary frequency. Will increase cardura to 8mg  daily. Could considering starting finasteride in the future if this continues to be an issue.

## 2021-03-17 NOTE — Assessment & Plan Note (Signed)
Recommended patient take HCTZ 12.5 mg in the morning.  We will be increasing his Cardura as above which should help some with his blood pressure as well.  He will continue home monitoring.

## 2021-03-18 LAB — URINE CULTURE
MICRO NUMBER:: 12886203
Result:: NO GROWTH
SPECIMEN QUALITY:: ADEQUATE

## 2021-03-19 NOTE — Progress Notes (Signed)
Please inform patient of the following:  His urine culture was negative. I would like for him to let us know how his symptoms are doing with the increased dose of cardua. He does not need any antibiotics at this time.  Scott Herman. [name], MD 03/19/2021 8:02 AM

## 2021-04-16 ENCOUNTER — Telehealth: Payer: Self-pay | Admitting: Family Medicine

## 2021-04-16 ENCOUNTER — Telehealth: Payer: Self-pay

## 2021-04-16 NOTE — Telephone Encounter (Signed)
Patient would like a call from Dr. [name] concerning a bleeding wound on his leg.

## 2021-04-16 NOTE — Telephone Encounter (Signed)
Agree with plan 

## 2021-04-16 NOTE — Telephone Encounter (Signed)
Appointment schedule for 340 on Monday  Patient aware if symptoms worsen go to [facility]

## 2021-04-16 NOTE — Telephone Encounter (Signed)
Patient is scheduled for 2/20    Patient Name: Scott Herman, Scott Herman Gender: Male DOB: 07-Jun-1933 Age: 88 Y 11 M 12 D Return Phone Number: [phone] (Primary) Address: City/ State/ Zip: [place]  [address] Client [facility] [place] [facility] [place] [facility] Provider Dimas Chyle- MD Contact Type Call Who Is Calling Patient / Member / Family / Caregiver Call Type Triage / Clinical Relationship To Patient Self Return Phone Number [phone] (Primary) Chief Complaint Wound Check or Dressing Change Reason for Call Symptomatic / Request for Health Information Initial Comment Caller states he is calling from the office. Caller states pt is diabetic and has a wound and needs advice. Caller states there is no appt for today. Translation No Nurse Assessment Nurse: Fredderick Phenix, RN, Lelan Pons Date/Time Eilene Ghazi Time): 04/16/2021 2:21:04 PM Confirm and document reason for call. If symptomatic, describe symptoms. ---Caller states he is calling from the office. Caller states pt is diabetic and has a nickel sized left ankle wound. He didn't know it was there and accidentally scratched it. He put neosporin on it last night. Does the patient have any new or worsening symptoms? ---Yes Will a triage be completed? ---Yes Related visit to physician within the last 2 weeks? ---No Does the PT have any chronic conditions? (i.e. diabetes, asthma, this includes High risk factors for pregnancy, etc.) ---Yes List chronic conditions. ---diabetes type 2 Is this a behavioral health or substance abuse call? ---No Guidelines Guideline Title Affirmed Question Affirmed Notes Nurse Date/Time (Eastern Time) Wound Infection Last tetanus shot > 10 years ago Stormy Card 04/16/2021 2:23:12 PM Disp. Time Eilene Ghazi Time) Disposition Final User 04/16/2021 2:39:44 PM SEE PCP WITHIN 3 DAYS Yes Fredderick Phenix, RN, [place] Disagree/Comply  Comply Caller Understands Yes PreDisposition Did not know what to do Care Advice Given Per Guideline SEE PCP WITHIN 3 DAYS: * You need to be seen within 2 or 3 days. TETANUS SHOT: * You should get a tetanus booster shot in the next 3 days. CARE ADVICE per Wound Infection (Adult) guideline. CALL BACK IF: * You become worse Referrals REFERRED TO PCP OFFICE

## 2021-04-19 ENCOUNTER — Other Ambulatory Visit: Payer: Self-pay

## 2021-04-19 ENCOUNTER — Ambulatory Visit (INDEPENDENT_AMBULATORY_CARE_PROVIDER_SITE_OTHER): Payer: Medicare Other | Admitting: Family Medicine

## 2021-04-19 ENCOUNTER — Encounter: Payer: Self-pay | Admitting: Family Medicine

## 2021-04-19 VITALS — BP 119/72 | HR 100 | Temp 98.5°F | Ht 70.0 in | Wt 172.8 lb

## 2021-04-19 DIAGNOSIS — M79605 Pain in left leg: Secondary | ICD-10-CM | POA: Diagnosis not present

## 2021-04-19 DIAGNOSIS — I10 Essential (primary) hypertension: Secondary | ICD-10-CM | POA: Diagnosis not present

## 2021-04-19 DIAGNOSIS — E119 Type 2 diabetes mellitus without complications: Secondary | ICD-10-CM

## 2021-04-19 DIAGNOSIS — E1159 Type 2 diabetes mellitus with other circulatory complications: Secondary | ICD-10-CM | POA: Diagnosis not present

## 2021-04-19 DIAGNOSIS — I152 Hypertension secondary to endocrine disorders: Secondary | ICD-10-CM

## 2021-04-19 NOTE — Patient Instructions (Addendum)
It was very nice to see you today!  Please keep your wound clean and dry.   I think this will heal over the next 1-2 weeks.  Please let us know if you have any worsening symptoms.  Take care, Dr [name]  PLEASE NOTE:  If you had any lab tests please let us know if you have not heard back within a few days. You may see your results on mychart before we have a chance to review them but we will give you a call once they are reviewed by [place]. If we ordered any referrals today, please let us know if you have not heard from their office within the next week.   Please try these tips to maintain a healthy lifestyle:  Eat at least 3 REAL meals and 1-2 snacks per day.  Aim for no more than 5 hours between eating.  If you eat breakfast, please do so within one hour of getting up.   Each meal should contain half fruits/vegetables, one quarter protein, and one quarter carbs (no bigger than a computer mouse)  Cut down on sweet beverages. This includes juice, soda, and sweet tea.   Drink at least 1 glass of water with each meal and aim for at least 8 glasses per day  Exercise at least 150 minutes every week.

## 2021-04-19 NOTE — Progress Notes (Signed)
° °  Scott Herman is a 88 y.o. male who presents today for an office visit.  Assessment/Plan:  New/Acute Problems: Left Leg Wound No red flags. No signs of cellulitis.  We will continue with routine wound care.  He will continue topical antibiotic ointment with routine dressing changes.  This should heal over the next week or so.  We discussed reasons to return to care.  Chronic Problems Addressed Today: Diabetes ([place]) Follows with endocrinology. A1cs has been at goal.   Hypertension associated with diabetes ([place]) At goal on HCTZ 12.5 mg once daily.     Subjective:  HPI:  Patient here with wound on left leg.  Started 3 days ago.  He scratched the area.  Noticeable when is the area.  Has had some redness.  This is improved the last couple days.  He has been using topical Neosporin which seems to be helping.  No reported fevers or chills.       Objective:  Physical Exam: BP 119/72    Pulse 100    Temp 98.5 F (36.9 C) (Temporal)    Ht 5\' 10"  (1.778 m)    Wt 172 lb 12.8 oz (78.4 kg)    SpO2 99%    BMI 24.79 kg/m   Gen: No acute distress, resting comfortably Skin: Approximately 1 cm open wound on left lower extremity.  Very small amount of surrounding erythema.  No purulent drainage.  Surrounding xerosis cutis noted. Neuro: Grossly normal, moves all extremities Psych: Normal affect and thought content      Scott Herman M. [name], MD 04/19/2021 3:50 PM

## 2021-04-19 NOTE — Assessment & Plan Note (Signed)
Follows with endocrinology. A1cs has been at goal.

## 2021-04-19 NOTE — Assessment & Plan Note (Signed)
At goal on HCTZ 12.5 mg once daily.

## 2021-04-25 ENCOUNTER — Other Ambulatory Visit: Payer: Self-pay | Admitting: Family Medicine

## 2021-04-29 ENCOUNTER — Telehealth: Payer: Self-pay | Admitting: Family Medicine

## 2021-04-29 ENCOUNTER — Other Ambulatory Visit: Payer: Self-pay

## 2021-04-29 ENCOUNTER — Encounter: Payer: Self-pay | Admitting: Family Medicine

## 2021-04-29 MED ORDER — DOXAZOSIN MESYLATE 4 MG PO TABS: ORAL_TABLET | ORAL | 1 refills | Status: DC

## 2021-04-29 NOTE — Telephone Encounter (Signed)
Pharmacy asking for a call back re Doxazosin 4mg  1 tab a day- pharmacist stated he is now taking 2 a day per patient - pharmacist requesting clarification. Please call [phone]    ?

## 2021-04-29 NOTE — Telephone Encounter (Signed)
New Rx sent to pharmacy increasing dosage for pt to take 2 tablets per day per last office note ?

## 2021-04-30 NOTE — Telephone Encounter (Signed)
Can we get more information? If this is a recent change then I agree she needs to go the ED - she may be dehydrated or have something else going on.  ?

## 2021-04-30 NOTE — Telephone Encounter (Signed)
Spoke with Scott Herman and advised recommendations, he states Scott Herman got up this morning on her own and seems to be doing better today. Will keep [place] posted if things change. Also advised a new Rx was sent to pharmacy on 04/29/21 for Cardura 4 MG taking 2 tablets per days. Pt verbalized understanding of all advise.  ?

## 2021-05-05 DIAGNOSIS — M25572 Pain in left ankle and joints of left foot: Secondary | ICD-10-CM | POA: Diagnosis not present

## 2021-05-05 DIAGNOSIS — R600 Localized edema: Secondary | ICD-10-CM | POA: Diagnosis not present

## 2021-05-05 DIAGNOSIS — I872 Venous insufficiency (chronic) (peripheral): Secondary | ICD-10-CM | POA: Diagnosis not present

## 2021-05-21 ENCOUNTER — Other Ambulatory Visit: Payer: Self-pay

## 2021-05-21 DIAGNOSIS — M7989 Other specified soft tissue disorders: Secondary | ICD-10-CM

## 2021-05-24 ENCOUNTER — Other Ambulatory Visit: Payer: Self-pay

## 2021-05-24 ENCOUNTER — Ambulatory Visit: Payer: Medicare Other | Admitting: Physician Assistant

## 2021-05-24 ENCOUNTER — Ambulatory Visit (HOSPITAL_COMMUNITY)
Admission: RE | Admit: 2021-05-24 | Discharge: 2021-05-24 | Disposition: A | Discharge status: Home / Self Care | Payer: Medicare Other | Source: Ambulatory Visit | Attending: Physician Assistant | Admitting: Physician Assistant

## 2021-05-24 VITALS — BP 97/61 | HR 93 | Temp 97.8°F | Resp 18 | Ht 70.0 in | Wt 168.8 lb

## 2021-05-24 DIAGNOSIS — M7989 Other specified soft tissue disorders: Secondary | ICD-10-CM | POA: Insufficient documentation

## 2021-05-24 DIAGNOSIS — L818 Other specified disorders of pigmentation: Secondary | ICD-10-CM

## 2021-05-24 DIAGNOSIS — I872 Venous insufficiency (chronic) (peripheral): Secondary | ICD-10-CM | POA: Diagnosis not present

## 2021-05-24 NOTE — Progress Notes (Signed)
? ? ?Requested by:  ?Vivi Barrack, MD ?[address] ?[place],  [place] [id number] ? ?Reason for consultation: discoloration of left leg  ? ? ?History of Present Illness  ? ?Scott Herman is a 88 y.o. (1934/01/08) male who presents for evaluation of some darkening of his skin on left leg. He explains that he had hit his leg on a folding chair a while back and it took a long time for the wound to heal. He subsequently noticed some darkening of the skin around the area and had brought it to the attention of his PCP. His PCP recommended vascular evaluation. He denies any aching, heaviness, tiredness, burning, bleeding or ulceration. He says he occasionally will feel little twinge here and there in his legs and sometimes the skin is very dry and itchy. He has history of swelling in his legs and for a time he was wearing knee high compression socks but he explains that he sort of fell out of habit to wear them. He does occasionally elevate. He otherwise denies any pain on ambulation or at rest. No tissue loss. He remains very active. Says he use to ride his bike 100 miles a week but has slowed down recently with having to care for his wife. No history of DVT. No family history of DVT or venous disease.  ? ?Venous symptoms include: darkening of skin of leg ?Onset/duration:  > 6 months  ?Occupation:  retired ?Aggravating factors: none ?Alleviating factors: elevation, compression ?Compression:  knee high Helps:  yes ?Pain medications:  none ?Previous vein procedures:  none ?History of DVT:  no ? ?Past Medical History:  ?Diagnosis Date  ? Abnormal stress electrocardiogram test Jan 2013  ? Negative cardiac catheterization.   ? Diabetes mellitus, type 2 ([place])   ? Dr Altheimer  ? Diverticulosis   ? GERD (gastroesophageal reflux disease)   ? Hiatal hernia   ? Hyperlipidemia   ? Hypertension   ? Normal cardiac stress test Jan 2009  ? Normal ETT  ? Palpitations   ? Negative holter in 2009 except for tachycardia with bicycling   ? ? ?Past Surgical History:  ?Procedure Laterality Date  ? CARDIAC CATHETERIZATION  Feb 2013  ? Mild nonobstructive CAD. Normal LV function  ? CHOLECYSTECTOMY    ? COLONOSCOPY    ? Dr Fuller Plan  ? COLONOSCOPY WITH PROPOFOL N/A 12/22/2016  ? Procedure: COLONOSCOPY WITH PROPOFOL;  Surgeon: Gatha Mayer, MD;  Location: [facility];  Service: Endoscopy;  Laterality: N/A;  ? HEMORRHOID SURGERY    ? INGUINAL HERNIA REPAIR    ? ORIF CLAVICULAR FRACTURE    ? left  ? TONSILLECTOMY    ? ? ?Social History  ? ?Socioeconomic History  ? Marital status: Married  ?  Spouse name: Not on file  ? Number of children: Not on file  ? Years of education: Not on file  ? Highest education level: Not on file  ?Occupational History  ? Occupation: Retired  ?Tobacco Use  ? Smoking status: Never  ? Smokeless tobacco: Never  ?Vaping Use  ? Vaping Use: Never used  ?Substance and Sexual Activity  ? Alcohol use: No  ?  Alcohol/week: 0.0 standard drinks  ? Drug use: No  ? Sexual activity: Yes  ?Other Topics Concern  ? Not on file  ?Social History Narrative  ? Not on file  ? ?Social Determinants of Health  ? ?Financial Resource Strain: Low Risk   ? Difficulty of Paying Living Expenses: Not hard  at all  ?Food Insecurity: No Food Insecurity  ? Worried About Charity fundraiser in the Last Year: Never true  ? Ran Out of Food in the Last Year: Never true  ?Transportation Needs: No Transportation Needs  ? Lack of Transportation (Medical): No  ? Lack of Transportation (Non-Medical): No  ?Physical Activity: Inactive  ? Days of Exercise per Week: 0 days  ? Minutes of Exercise per Session: 0 min  ?Stress: No Stress Concern Present  ? Feeling of Stress : Not at all  ?Social Connections: Moderately Integrated  ? Frequency of Communication with Friends and Family: More than three times a week  ? Frequency of Social Gatherings with Friends and Family: Once a week  ? Attends Religious Services: More than 4 times per year  ? Active Member of Clubs or Organizations:  No  ? Attends Archivist Meetings: Never  ? Marital Status: Married  ?Intimate Partner Violence: Not At Risk  ? Fear of Current or Ex-Partner: No  ? Emotionally Abused: No  ? Physically Abused: No  ? Sexually Abused: No  ? ? ?Family History  ?Problem Relation Age of Onset  ? Prostate cancer Father   ? Diabetes Father   ? Diverticulosis Father   ? Hypertension Brother   ? Diabetes Brother   ? Prostate cancer Paternal Grandfather   ? Diabetes Paternal Grandmother   ? Colon cancer Neg Hx   ? ? ?Current Outpatient Medications  ?Medication Sig Dispense Refill  ? acetaminophen (TYLENOL) 650 MG CR tablet Take 1,300 mg by mouth every 8 (eight) hours as needed for pain.    ? Ascorbic Acid (VITAMIN C) 1000 MG tablet Take 1,000 mg by mouth daily.    ? aspirin 81 MG tablet Take 1 tablet (81 mg total) by mouth at bedtime. 30 tablet   ? B-D UF III MINI PEN NEEDLES 31G X 5 MM MISC SMARTSIG:1 Needle SUB-Q Daily    ? Blood Glucose Calibration (OT ULTRA/FASTTK CNTRL SOLN) SOLN Use to test blood sugars (Dx: E11.9)    ? CINNAMON PO Take 2,000 mg by mouth at bedtime.    ? co-enzyme Q-10 30 MG capsule Take 30 mg by mouth daily.    ? Continuous Blood Gluc Receiver (FREESTYLE LIBRE 2 READER) DEVI SCAN AS NEEDED FOR CONTINUOUS GLUCOSE MONITORING    ? doxazosin (CARDURA) 4 MG tablet Dose increase 2 tablets per day 90 tablet 1  ? esomeprazole (NEXIUM) 40 MG capsule Take 1 capsule (40 mg total) by mouth daily before breakfast. 90 capsule 1  ? glucose blood (ONETOUCH VERIO) test strip Use to test blood sugars 4 times a day or as directed (Dx: E11.65)    ? hydrochlorothiazide (HYDRODIURIL) 12.5 MG tablet Take 1 tablet by mouth once daily 90 tablet 0  ? Insulin Glargine (TOUJEO SOLOSTAR [place]) Inject 24 Units into the skin every morning.     ? metFORMIN (GLUCOPHAGE-XR) 750 MG 24 hr tablet Take 750 mg by mouth 2 (two) times daily.    ? Multiple Vitamins-Minerals (MENS 50+ MULTI VITAMIN/MIN) TABS Take 1 tablet by mouth daily.    ?  pioglitazone (ACTOS) 45 MG tablet Take 45 mg by mouth daily.    ? pravastatin (PRAVACHOL) 80 MG tablet Take 80 mg by mouth at bedtime.    ? ciclopirox (PENLAC) 8 % solution Apply topically at bedtime. Apply over nail and surrounding skin. Apply daily over previous coat. Remove weekly with file or polish remover. (Patient not taking: Reported on  05/24/2021) 6.6 mL 0  ? ?No current facility-administered medications for this visit.  ? ? ?No Known Allergies ? ?REVIEW OF SYSTEMS (negative unless checked):  ? ?Cardiac:  ?'[]'$  Chest pain or chest pressure? ?'[]'$  Shortness of breath upon activity? ?'[]'$  Shortness of breath when lying flat? ?'[]'$  Irregular heart rhythm? ? ?Vascular:  ?'[]'$  Pain in calf, thigh, or hip brought on by walking? ?'[]'$  Pain in feet at night that wakes you up from your sleep? ?'[]'$  Blood clot in your veins? ?'[]'$  Leg swelling? ? ?Pulmonary:  ?'[]'$  Oxygen at home? ?'[]'$  Productive cough? ?'[]'$  Wheezing? ? ?Neurologic:  ?'[]'$  Sudden weakness in arms or legs? ?'[]'$  Sudden numbness in arms or legs? ?'[]'$  Sudden onset of difficult speaking or slurred speech? ?'[]'$  Temporary loss of vision in one eye? ?'[]'$  Problems with dizziness? ? ?Gastrointestinal:  ?'[]'$  Blood in stool? ?'[]'$  Vomited blood? ? ?Genitourinary:  ?'[]'$  Burning when urinating? ?'[]'$  Blood in urine? ? ?Psychiatric:  ?'[]'$  Major depression ? ?Hematologic:  ?'[]'$  Bleeding problems? ?'[]'$  Problems with blood clotting? ? ?Dermatologic:  ?'[]'$  Rashes or ulcers? ? ?Constitutional:  ?'[]'$  Fever or chills? ? ?Ear/Nose/Throat:  ?'[]'$  Change in hearing? ?'[]'$  Nose bleeds? ?'[]'$  Sore throat? ? ?Musculoskeletal:  ?'[]'$  Back pain? ?'[]'$  Joint pain? ?'[]'$  Muscle pain? ? ? ?Physical Examination  ?  ? ?Vitals:  ? 05/24/21 1022  ?BP: 97/61  ?Pulse: 93  ?Resp: 18  ?Temp: 97.8 ?F (36.6 ?C)  ?TempSrc: Temporal  ?SpO2: 96%  ?Weight: 168 lb 12.8 oz (76.6 kg)  ?Height: '5\' 10"'$  (1.778 m)  ? ?Body mass index is 24.22 kg/m?. ? ?General:  WDWN in NAD; vital signs documented above ?Gait: Normal ?HENT: WNL,  normocephalic ?Pulmonary: normal non-labored breathing , without  wheezing ?Cardiac: regular HR, without  Murmurs without carotid bruit ?Abdomen: soft, NT, no masses ?Vascular Exam/Pulses:2+ radial, 2+ femoral, Dp and PT pulses bilateral

## 2021-06-23 DIAGNOSIS — N401 Enlarged prostate with lower urinary tract symptoms: Secondary | ICD-10-CM | POA: Diagnosis not present

## 2021-06-23 DIAGNOSIS — R531 Weakness: Secondary | ICD-10-CM | POA: Diagnosis not present

## 2021-06-23 DIAGNOSIS — Z794 Long term (current) use of insulin: Secondary | ICD-10-CM | POA: Diagnosis not present

## 2021-06-23 DIAGNOSIS — Z7983 Long term (current) use of bisphosphonates: Secondary | ICD-10-CM | POA: Diagnosis not present

## 2021-06-23 DIAGNOSIS — I10 Essential (primary) hypertension: Secondary | ICD-10-CM | POA: Diagnosis not present

## 2021-06-23 DIAGNOSIS — E1165 Type 2 diabetes mellitus with hyperglycemia: Secondary | ICD-10-CM | POA: Diagnosis not present

## 2021-06-23 DIAGNOSIS — Z7982 Long term (current) use of aspirin: Secondary | ICD-10-CM | POA: Diagnosis not present

## 2021-06-23 DIAGNOSIS — E782 Mixed hyperlipidemia: Secondary | ICD-10-CM | POA: Diagnosis not present

## 2021-06-23 DIAGNOSIS — Z79899 Other long term (current) drug therapy: Secondary | ICD-10-CM | POA: Diagnosis not present

## 2021-06-29 DIAGNOSIS — E1122 Type 2 diabetes mellitus with diabetic chronic kidney disease: Secondary | ICD-10-CM | POA: Diagnosis not present

## 2021-06-29 DIAGNOSIS — Z794 Long term (current) use of insulin: Secondary | ICD-10-CM | POA: Diagnosis not present

## 2021-06-29 DIAGNOSIS — E1142 Type 2 diabetes mellitus with diabetic polyneuropathy: Secondary | ICD-10-CM | POA: Diagnosis not present

## 2021-06-29 DIAGNOSIS — E1165 Type 2 diabetes mellitus with hyperglycemia: Secondary | ICD-10-CM | POA: Diagnosis not present

## 2021-07-29 ENCOUNTER — Encounter: Payer: Self-pay | Admitting: Family Medicine

## 2021-07-29 ENCOUNTER — Ambulatory Visit (INDEPENDENT_AMBULATORY_CARE_PROVIDER_SITE_OTHER): Payer: Medicare Other | Admitting: Family Medicine

## 2021-07-29 VITALS — BP 116/73 | HR 90 | Temp 98.2°F | Resp 16 | Ht 70.0 in | Wt 168.2 lb

## 2021-07-29 DIAGNOSIS — L570 Actinic keratosis: Secondary | ICD-10-CM | POA: Diagnosis not present

## 2021-07-29 DIAGNOSIS — I152 Hypertension secondary to endocrine disorders: Secondary | ICD-10-CM | POA: Diagnosis not present

## 2021-07-29 DIAGNOSIS — E1159 Type 2 diabetes mellitus with other circulatory complications: Secondary | ICD-10-CM

## 2021-07-29 NOTE — Progress Notes (Signed)
   Scott Herman is a 88 y.o. male who presents today for an office visit.  Assessment/Plan:  Chronic Problems Addressed Today: Actinic keratosis Cryotherapy applied today.  See below procedure note.  He tolerated well.  He will follow-up with dermatology soon.  Hypertension associated with diabetes ([place]) At goal on HCTZ 12.5 mg once daily.    Subjective:  HPI:  See A/p for status of chronic conditions.         Objective:  Physical Exam: BP 116/73   Pulse 90   Temp 98.2 F (36.8 C) (Temporal)   Resp 16   Ht '5\' 10"'$  (1.778 m)   Wt 168 lb 3.2 oz (76.3 kg)   SpO2 97%   BMI 24.13 kg/m   Gen: No acute distress, resting comfortably Skin: Actinic keratosis noted on her right posterior pinna Neuro: Grossly normal, moves all extremities Psych: Normal affect and thought content  Cryotherapy Procedure Note  Pre-operative Diagnosis: Actinic keratosis  Locations: Right Posterior Ear  Indications: Therapeutic  Procedure Details  Patient informed of risks (permanent scarring, infection, light or dark discoloration, bleeding, infection, weakness, numbness and recurrence of the lesion) and benefits of the procedure and verbal informed consent obtained.  The areas are treated with liquid nitrogen therapy, frozen until ice ball extended 3 mm beyond lesion, allowed to thaw, and treated again. The patient tolerated procedure well.  The patient was instructed on post-op care, warned that there may be blister formation, redness and pain. Recommend OTC analgesia as needed for pain.  Condition: Stable  Complications: none.       [name]. [name], MD 07/29/2021 11:19 AM

## 2021-07-29 NOTE — Assessment & Plan Note (Signed)
At goal on HCTZ 12.5 mg once daily.

## 2021-07-29 NOTE — Patient Instructions (Addendum)
It was very nice to see you today!  We froze a spot in your ear today.  This should scab up and fall off within the next 1 to 2 weeks.  Please let us know if you have any complications or any signs of infection.  He is good to go  Take care, Dr [name]  PLEASE NOTE:  If you had any lab tests please let us know if you have not heard back within a few days. You may see your results on mychart before we have a chance to review them but we will give you a call once they are reviewed by [place]. If we ordered any referrals today, please let us know if you have not heard from their office within the next week.   Please try these tips to maintain a healthy lifestyle:  Eat at least 3 REAL meals and 1-2 snacks per day.  Aim for no more than 5 hours between eating.  If you eat breakfast, please do so within one hour of getting up.   Each meal should contain half fruits/vegetables, one quarter protein, and one quarter carbs (no bigger than a computer mouse)  Cut down on sweet beverages. This includes juice, soda, and sweet tea.   Drink at least 1 glass of water with each meal and aim for at least 8 glasses per day  Exercise at least 150 minutes every week.

## 2021-07-29 NOTE — Assessment & Plan Note (Signed)
Cryotherapy applied today.  See below procedure note.  He tolerated well.  He will follow-up with dermatology soon.

## 2021-08-02 ENCOUNTER — Other Ambulatory Visit: Payer: Self-pay | Admitting: Family Medicine

## 2021-08-11 ENCOUNTER — Ambulatory Visit: Payer: Medicare Other | Admitting: Neurology
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
## 2021-09-07 ENCOUNTER — Encounter: Payer: Self-pay | Admitting: Neurology

## 2021-09-07 ENCOUNTER — Ambulatory Visit: Payer: Medicare Other | Admitting: Neurology

## 2021-09-07 VITALS — BP 115/71 | HR 84 | Ht 70.0 in | Wt 165.6 lb

## 2021-09-07 DIAGNOSIS — G454 Transient global amnesia: Secondary | ICD-10-CM | POA: Diagnosis not present

## 2021-09-07 NOTE — Patient Instructions (Signed)
Transient Global Amnesia Transient global amnesia causes a sudden and temporary (transient) loss of memory (amnesia). You may recall memories from your distant past and people you know well. However, you may not recall things that happened more recently in the past days, months, or even year. A transient global amnesia episode does not last longer than 24 hours. Transient global amnesia does not affect your other brain functions. Your memory usually returns to normal after an episode is over. One episode of transient global amnesia does not make you more likely to have a stroke, a relapse, or other complications. What are the causes? The cause of this condition is not known. Certain activities have been reported to trigger transient global amnesia. These activities include: Swimming in very cold or hot water. Sexual intercourse. Emotional distress, such as receiving bad news or having a lot of stress at once. Strenuous exercise or activity. What increases the risk? You are more likely to develop this condition if: You are 35-59 years old. You have a history of migraine headaches. What are the signs or symptoms? The main symptoms of this condition include: Being unable to remember recent events. Asking repetitive questions about a situation and surroundings and not recalling the answers to these questions. Other symptoms include: Restlessness and nervousness. Confusion. Headaches. Dizziness. Nausea. How is this diagnosed? This condition may be diagnosed based on: Your symptoms. A physical exam. A test to check your mental abilities (cognitive evaluation). Imaging studies to check brain function. These may include: Electroencephalogram (EEG). This test checks the brain's electrical activity. CT scan. MRI. How is this treated? There is no treatment for this condition. An episode typically goes away on its own after a few hours. You may receive medicines to treat other conditions, such  as a migraine. Follow these instructions at home: Take over-the-counter and prescription medicines only as told by your health care provider. Avoid taking medicines that can affect thinking, such as pain or sleeping medicines. Learn what activities may trigger an episode. Avoid these activities as told by your health care provider. Find ways to manage stress, such as meditation or yoga. Keep all follow-up visits as told by your health care provider. This is important. Contact a health care provider if you: Have a migraine that does not go away. Experience transient global amnesia repeatedly. Get help right away if you: Have a seizure. Summary Transient global amnesia causes a sudden and temporary (transient) loss of memory (amnesia). There is no treatment for this condition. An episode typically goes away on its own after a few hours. You may receive medicines to treat other conditions, such as a migraine. Transient global amnesia does not affect your other brain functions. Your memory usually returns to normal after an episode is over. This information is not intended to replace advice given to you by your health care provider. Make sure you discuss any questions you have with your health care provider. Document Revised: 04/27/2020 Document Reviewed: 04/27/2020 Elsevier Patient Education  [place].

## 2021-09-22 ENCOUNTER — Encounter (INDEPENDENT_AMBULATORY_CARE_PROVIDER_SITE_OTHER): Payer: Medicare Other | Admitting: Ophthalmology

## 2021-09-23 ENCOUNTER — Encounter (INDEPENDENT_AMBULATORY_CARE_PROVIDER_SITE_OTHER): Payer: Medicare Other | Admitting: Ophthalmology

## 2021-09-23 DIAGNOSIS — H353132 Nonexudative age-related macular degeneration, bilateral, intermediate dry stage: Secondary | ICD-10-CM

## 2021-09-23 DIAGNOSIS — I10 Essential (primary) hypertension: Secondary | ICD-10-CM | POA: Diagnosis not present

## 2021-09-23 DIAGNOSIS — H35033 Hypertensive retinopathy, bilateral: Secondary | ICD-10-CM | POA: Diagnosis not present

## 2021-09-23 DIAGNOSIS — E113293 Type 2 diabetes mellitus with mild nonproliferative diabetic retinopathy without macular edema, bilateral: Secondary | ICD-10-CM

## 2021-09-23 DIAGNOSIS — H43813 Vitreous degeneration, bilateral: Secondary | ICD-10-CM

## 2021-09-28 ENCOUNTER — Ambulatory Visit (INDEPENDENT_AMBULATORY_CARE_PROVIDER_SITE_OTHER): Payer: Medicare Other | Admitting: Family Medicine

## 2021-09-28 ENCOUNTER — Encounter: Payer: Self-pay | Admitting: Family Medicine

## 2021-09-28 VITALS — BP 100/80 | HR 89 | Temp 98.2°F | Wt 166.6 lb

## 2021-09-28 DIAGNOSIS — L570 Actinic keratosis: Secondary | ICD-10-CM | POA: Diagnosis not present

## 2021-09-28 DIAGNOSIS — I152 Hypertension secondary to endocrine disorders: Secondary | ICD-10-CM

## 2021-09-28 DIAGNOSIS — E1159 Type 2 diabetes mellitus with other circulatory complications: Secondary | ICD-10-CM | POA: Diagnosis not present

## 2021-09-28 NOTE — Assessment & Plan Note (Signed)
Blood pressure is well controlled today.  He is only been taking 6.25 mg of HCTZ daily.  It is difficult for him to cut the pill in half.  We will stop completely today.  He will continue to monitor at home and let us know if persistently 140/90 or higher.

## 2021-09-28 NOTE — Assessment & Plan Note (Signed)
Cryotherapy applied today.  See below procedure note.  We will place referral to dermatologist

## 2021-09-28 NOTE — Progress Notes (Signed)
   Scott Herman is a 88 y.o. male who presents today for an office visit.  Assessment/Plan:  Chronic Problems Addressed Today: Hypertension associated with diabetes ([place]) Blood pressure is well controlled today.  He is only been taking 6.25 mg of HCTZ daily.  It is difficult for him to cut the pill in half.  We will stop completely today.  He will continue to monitor at home and let us know if persistently 140/90 or higher.    Actinic keratosis Cryotherapy applied today.  See below procedure note.  We will place referral to dermatologist     Subjective:  HPI:  See A/p for status of chronic conditions.         Objective:  Physical Exam: BP 100/80 (BP Location: Left Arm, Patient Position: Sitting, Cuff Size: Normal)   Pulse 89   Temp 98.2 F (36.8 C) (Temporal)   Wt 166 lb 9.6 oz (75.6 kg)   SpO2 94%   BMI 23.90 kg/m   Gen: No acute distress, resting comfortably CV: Regular rate and rhythm with no murmurs appreciated Pulm: Normal work of breathing, clear to auscultation bilaterally with no crackles, wheezes, or rhonchi Neuro: Grossly normal, moves all extremities DERM: Scattered actinic keratoses on right ear and scalp Psych: Normal affect and thought content  Cryotherapy Procedure Note  Pre-operative Diagnosis: Actinic keratosis  Locations: Scalp, right ear  Indications: Therapeutic  Procedure Details  Patient informed of risks (permanent scarring, infection, light or dark discoloration, bleeding, infection, weakness, numbness and recurrence of the lesion) and benefits of the procedure and verbal informed consent obtained.  The areas are treated with liquid nitrogen therapy, frozen until ice ball extended 3 mm beyond lesion, allowed to thaw, and treated again. A total of 4 lesions were treated.The patient tolerated procedure well.  The patient was instructed on post-op care, warned that there may be blister formation, redness and pain. Recommend OTC analgesia as  needed for pain.  Condition: Stable  Complications: none.       Scott Herman. [name], MD 09/28/2021 10:34 AM

## 2021-09-28 NOTE — Patient Instructions (Addendum)
It was very nice to see you today!  We froze your spots today.  We will refer you to see a dermatologist.  Please stop your blood pressure pill.  Let me know if your blood pressure is persistently elevated at home.  Take care, Dr [name]  PLEASE NOTE:  If you had any lab tests please let us know if you have not heard back within a few days. You may see your results on mychart before we have a chance to review them but we will give you a call once they are reviewed by [place]. If we ordered any referrals today, please let us know if you have not heard from their office within the next week.   Please try these tips to maintain a healthy lifestyle:  Eat at least 3 REAL meals and 1-2 snacks per day.  Aim for no more than 5 hours between eating.  If you eat breakfast, please do so within one hour of getting up.   Each meal should contain half fruits/vegetables, one quarter protein, and one quarter carbs (no bigger than a computer mouse)  Cut down on sweet beverages. This includes juice, soda, and sweet tea.   Drink at least 1 glass of water with each meal and aim for at least 8 glasses per day  Exercise at least 150 minutes every week.

## 2021-10-13 ENCOUNTER — Ambulatory Visit: Payer: Medicare Other | Admitting: Dermatology

## 2021-10-14 DIAGNOSIS — L814 Other melanin hyperpigmentation: Secondary | ICD-10-CM | POA: Diagnosis not present

## 2021-10-14 DIAGNOSIS — L570 Actinic keratosis: Secondary | ICD-10-CM | POA: Diagnosis not present

## 2021-10-14 DIAGNOSIS — D225 Melanocytic nevi of trunk: Secondary | ICD-10-CM | POA: Diagnosis not present

## 2021-10-14 DIAGNOSIS — L738 Other specified follicular disorders: Secondary | ICD-10-CM | POA: Diagnosis not present

## 2021-10-29 DIAGNOSIS — Z125 Encounter for screening for malignant neoplasm of prostate: Secondary | ICD-10-CM | POA: Diagnosis not present

## 2021-10-29 DIAGNOSIS — N182 Chronic kidney disease, stage 2 (mild): Secondary | ICD-10-CM | POA: Diagnosis not present

## 2021-10-29 DIAGNOSIS — E1165 Type 2 diabetes mellitus with hyperglycemia: Secondary | ICD-10-CM | POA: Diagnosis not present

## 2021-10-29 DIAGNOSIS — Z79899 Other long term (current) drug therapy: Secondary | ICD-10-CM | POA: Diagnosis not present

## 2021-10-29 DIAGNOSIS — E1169 Type 2 diabetes mellitus with other specified complication: Secondary | ICD-10-CM | POA: Diagnosis not present

## 2021-10-29 DIAGNOSIS — I10 Essential (primary) hypertension: Secondary | ICD-10-CM | POA: Diagnosis not present

## 2021-10-29 DIAGNOSIS — Z794 Long term (current) use of insulin: Secondary | ICD-10-CM | POA: Diagnosis not present

## 2021-10-29 DIAGNOSIS — E785 Hyperlipidemia, unspecified: Secondary | ICD-10-CM | POA: Diagnosis not present

## 2021-10-29 DIAGNOSIS — E1122 Type 2 diabetes mellitus with diabetic chronic kidney disease: Secondary | ICD-10-CM | POA: Diagnosis not present

## 2021-10-31 ENCOUNTER — Other Ambulatory Visit: Payer: Self-pay | Admitting: Family Medicine

## 2021-11-02 DIAGNOSIS — I129 Hypertensive chronic kidney disease with stage 1 through stage 4 chronic kidney disease, or unspecified chronic kidney disease: Secondary | ICD-10-CM | POA: Diagnosis not present

## 2021-11-02 DIAGNOSIS — E11649 Type 2 diabetes mellitus with hypoglycemia without coma: Secondary | ICD-10-CM | POA: Diagnosis not present

## 2021-11-02 DIAGNOSIS — N182 Chronic kidney disease, stage 2 (mild): Secondary | ICD-10-CM | POA: Diagnosis not present

## 2021-11-02 DIAGNOSIS — E1122 Type 2 diabetes mellitus with diabetic chronic kidney disease: Secondary | ICD-10-CM | POA: Diagnosis not present

## 2021-11-10 ENCOUNTER — Ambulatory Visit: Payer: Medicare Other | Admitting: Dermatology

## 2021-11-12 ENCOUNTER — Ambulatory Visit (INDEPENDENT_AMBULATORY_CARE_PROVIDER_SITE_OTHER): Payer: Medicare Other

## 2021-11-12 DIAGNOSIS — Z23 Encounter for immunization: Secondary | ICD-10-CM

## 2021-11-22 ENCOUNTER — Ambulatory Visit: Payer: Medicare Other | Admitting: Podiatry

## 2021-11-22 DIAGNOSIS — M79675 Pain in left toe(s): Secondary | ICD-10-CM | POA: Diagnosis not present

## 2021-11-22 DIAGNOSIS — M79674 Pain in right toe(s): Secondary | ICD-10-CM

## 2021-11-22 DIAGNOSIS — B351 Tinea unguium: Secondary | ICD-10-CM

## 2021-11-22 NOTE — Progress Notes (Signed)
   SUBJECTIVE Patient with a history of diabetes mellitus presents to office today complaining of elongated, thickened nails that cause pain while ambulating in shoes.  Patient is unable to trim their own nails. Patient is here for further evaluation and treatment.   Past Medical History:  Diagnosis Date   Abnormal stress electrocardiogram test Jan 2013   Negative cardiac catheterization.    Diabetes mellitus, type 2 ([place])    Dr Altheimer   Diverticulosis    GERD (gastroesophageal reflux disease)    Hiatal hernia    Hyperlipidemia    Hypertension    Normal cardiac stress test Jan 2009   Normal ETT   Palpitations    Negative holter in 2009 except for tachycardia with bicycling    OBJECTIVE General Patient is awake, alert, and oriented x 3 and in no acute distress. Derm Skin is dry and supple bilateral. Negative open lesions or macerations. Remaining integument unremarkable. Nails are tender, long, thickened and dystrophic with subungual debris, consistent with onychomycosis, 1-5 bilateral. No signs of infection noted. Vasc  DP and PT pedal pulses palpable bilaterally. Temperature gradient within normal limits.  Neuro Epicritic and protective threshold sensation diminished bilaterally.  Musculoskeletal Exam No symptomatic pedal deformities noted bilateral. Muscular strength within normal limits.  ASSESSMENT 1. Diabetes Mellitus w/ peripheral neuropathy 2.  Pain due to onychomycosis of toenails bilateral  PLAN OF CARE 1. Patient evaluated today. 2. Instructed to maintain good pedal hygiene and foot care. Stressed importance of controlling blood sugar.  3. Mechanical debridement of nails 1-5 bilaterally performed using a nail nipper. Filed with dremel without incident.  4. Return to clinic in 3 mos.     Edrick Kins, DPM [facility]  Dr. Edrick Kins, DPM    [address], [place] [address]                Office  [phone]  Fax [phone]

## 2021-11-28 ENCOUNTER — Other Ambulatory Visit: Payer: Self-pay

## 2021-11-28 ENCOUNTER — Encounter (HOSPITAL_BASED_OUTPATIENT_CLINIC_OR_DEPARTMENT_OTHER): Payer: Self-pay

## 2021-11-28 DIAGNOSIS — Z79899 Other long term (current) drug therapy: Secondary | ICD-10-CM | POA: Diagnosis not present

## 2021-11-28 DIAGNOSIS — Z794 Long term (current) use of insulin: Secondary | ICD-10-CM | POA: Insufficient documentation

## 2021-11-28 DIAGNOSIS — E119 Type 2 diabetes mellitus without complications: Secondary | ICD-10-CM | POA: Diagnosis not present

## 2021-11-28 DIAGNOSIS — Z7982 Long term (current) use of aspirin: Secondary | ICD-10-CM | POA: Diagnosis not present

## 2021-11-28 DIAGNOSIS — R2243 Localized swelling, mass and lump, lower limb, bilateral: Secondary | ICD-10-CM | POA: Diagnosis not present

## 2021-11-28 DIAGNOSIS — R600 Localized edema: Secondary | ICD-10-CM | POA: Diagnosis not present

## 2021-11-28 DIAGNOSIS — I10 Essential (primary) hypertension: Secondary | ICD-10-CM | POA: Insufficient documentation

## 2021-11-28 LAB — BASIC METABOLIC PANEL WITH GFR
Anion gap: 8 (ref 5–15)
BUN: 17 mg/dL (ref 8–23)
CO2: 26 mmol/L (ref 22–32)
Calcium: 9.4 mg/dL (ref 8.9–10.3)
Chloride: 101 mmol/L (ref 98–111)
Creatinine, Ser: 0.88 mg/dL (ref 0.61–1.24)
GFR, Estimated: >60 mL/min
Glucose, Bld: 215 mg/dL — ABNORMAL HIGH (ref 70–99)
Potassium: 3.8 mmol/L (ref 3.5–5.1)
Sodium: 135 mmol/L (ref 135–145)

## 2021-11-28 LAB — CBC
HCT: 40 % (ref 39.0–52.0)
Hemoglobin: 13.6 g/dL (ref 13.0–17.0)
MCH: 30 pg (ref 26.0–34.0)
MCHC: 34 g/dL (ref 30.0–36.0)
MCV: 88.1 fL (ref 80.0–100.0)
Platelets: 238 K/uL (ref 150–400)
RBC: 4.54 MIL/uL (ref 4.22–5.81)
RDW: 13.6 % (ref 11.5–15.5)
WBC: 6.9 K/uL (ref 4.0–10.5)
nRBC: 0 % (ref 0.0–0.2)

## 2021-11-28 NOTE — ED Triage Notes (Signed)
Patient here POV from Home.  Endorses not taking his BP at Home frequently as his BP has been well-controlled even without BP Medication.  States he took it this AM and got 929 Systolic. Checked it again this PM at 2145 and got 168/97.   No CP. No SOB. No N/V/D.   NAD Noted during Triage. A&Ox4. GCS 15. Ambulatory.

## 2021-11-29 ENCOUNTER — Emergency Department (HOSPITAL_BASED_OUTPATIENT_CLINIC_OR_DEPARTMENT_OTHER)
Admission: EM | Admit: 2021-11-29 | Discharge: 2021-11-29 | Disposition: A | Discharge status: Home / Self Care | Payer: Medicare Other | Attending: Emergency Medicine | Admitting: Emergency Medicine

## 2021-11-29 DIAGNOSIS — I10 Essential (primary) hypertension: Secondary | ICD-10-CM

## 2021-11-29 DIAGNOSIS — R600 Localized edema: Secondary | ICD-10-CM

## 2021-11-29 NOTE — Discharge Instructions (Signed)
You were seen today with concerns of high blood pressure and lower extremity swelling.  Follow-up closely with your primary doctor to reinitiate your diuretic and/or blood pressure medication.

## 2021-11-29 NOTE — ED Provider Notes (Signed)
[facility] EMERGENCY DEPT Provider Note   CSN: [id number] Arrival date & time: 11/28/21  2229     History  Chief Complaint  Patient presents with   Hypertension    Scott Herman is a 88 y.o. male.  HPI     This is an 88 year old male who presents with concerns for high blood pressure.  Patient reports that he is a diabetic.  Recently he has had a blood sugar monitor in his left arm.  He states that he normally checks his blood pressure in that arm but has not over the last several months because of the monitor.  Today he checked his blood pressure 3 times.  It progressively got higher and higher and finally was 168/97 which alarmed him.  He states that he feels fine.  Denies headache, chest pain, shortness of breath, nausea, vomiting, strokelike symptoms.  Patient has noted some lower extremity edema over the last 1 to 2 weeks.  Patient states he used to be on blood pressure medication and diuretic but "everything was good so my doctor took me off of it."  Home Medications Prior to Admission medications   Medication Sig Start Date End Date Taking? Authorizing Provider  acetaminophen (TYLENOL) 650 MG CR tablet Take 1,300 mg by mouth every 8 (eight) hours as needed for pain.    [provider]  Ascorbic Acid (VITAMIN C) 1000 MG tablet Take 1,000 mg by mouth daily.    [provider]  aspirin 81 MG tablet Take 1 tablet (81 mg total) by mouth at bedtime. 12/29/16   Charlynne Cousins, MD  B-D UF III MINI PEN NEEDLES 31G X 5 MM MISC SMARTSIG:1 Needle SUB-Q Daily 11/14/19   [provider]  Blood Glucose Calibration (OT ULTRA/FASTTK CNTRL SOLN) SOLN Use to test blood sugars (Dx: E11.9)    [provider]  ciclopirox (PENLAC) 8 % solution Apply topically at bedtime. Apply over nail and surrounding skin. Apply daily over previous coat. Remove weekly with file or polish remover. 12/10/19   Evelina Bucy, DPM  CINNAMON PO Take 2,000 mg by  mouth at bedtime.    [provider]  co-enzyme Q-10 30 MG capsule Take 30 mg by mouth daily.    [provider]  Continuous Blood Gluc Receiver (FREESTYLE LIBRE 2 READER) DEVI SCAN AS NEEDED FOR CONTINUOUS GLUCOSE MONITORING 10/20/20   [provider]  doxazosin (CARDURA) 4 MG tablet Take 2 tablets by mouth once daily 11/02/21   Vivi Barrack, MD  esomeprazole (NEXIUM) 40 MG capsule Take 1 capsule (40 mg total) by mouth daily before breakfast. 04/25/12   Ladene Artist, MD  glucose blood (ONETOUCH VERIO) test strip Use to test blood sugars 4 times a day or as directed (Dx: E11.65) 05/13/19   [provider]  Insulin Glargine (TOUJEO SOLOSTAR [place]) Inject 24 Units into the skin every morning.     [provider]  metFORMIN (GLUCOPHAGE-XR) 750 MG 24 hr tablet Take 750 mg by mouth 2 (two) times daily.    [provider]  Multiple Vitamins-Minerals (MENS 50+ MULTI VITAMIN/MIN) TABS Take 1 tablet by mouth daily.    [provider]  pioglitazone (ACTOS) 45 MG tablet Take 45 mg by mouth daily.    [provider]  pravastatin (PRAVACHOL) 80 MG tablet Take 80 mg by mouth at bedtime.    [provider]      Allergies    Patient has no known allergies.  Review of Systems   Review of Systems  Respiratory:  Negative for shortness of breath.   Cardiovascular:  Negative for chest pain.  Neurological:  Negative for headaches.  All other systems reviewed and are negative.   Physical Exam Updated Vital Signs BP (!) 170/91   Pulse 98   Temp 98 F (36.7 C) (Oral)   Resp 18   Ht 1.778 m ('5\' 10"'$ )   Wt 78.9 kg   SpO2 95%   BMI 24.96 kg/m  Physical Exam Vitals and nursing note reviewed.  Constitutional:      Appearance: He is well-developed.     Comments: Elderly, nontoxic-appearing  HENT:     Head: Normocephalic and atraumatic.     Mouth/Throat:     Mouth: Mucous membranes are moist.  Eyes:     Pupils: Pupils are  equal, round, and reactive to light.  Cardiovascular:     Rate and Rhythm: Normal rate and regular rhythm.     Heart sounds: Normal heart sounds. No murmur heard. Pulmonary:     Effort: Pulmonary effort is normal. No respiratory distress.     Breath sounds: Normal breath sounds.  Abdominal:     Palpations: Abdomen is soft.     Tenderness: There is no abdominal tenderness.  Musculoskeletal:     Cervical back: Neck supple.     Right lower leg: Edema present.     Left lower leg: Edema present.     Comments: Pitting edema to the ankles bilaterally  Lymphadenopathy:     Cervical: No cervical adenopathy.  Skin:    General: Skin is warm and dry.  Neurological:     Mental Status: He is alert and oriented to person, place, and time.  Psychiatric:        Mood and Affect: Mood normal.     ED Results / Procedures / Treatments   Labs (all labs ordered are listed, but only abnormal results are displayed) Labs Reviewed  BASIC METABOLIC PANEL - Abnormal; Notable for the following components:      Result Value   Glucose, Bld 215 (*)    All other components within normal limits  CBC    EKG EKG Interpretation  Date/Time:  Monday November 29 2021 02:38:14 EDT Ventricular Rate:  95 PR Interval:  193 QRS Duration: 100 QT Interval:  359 QTC Calculation: 452 R Axis:   -48 Text Interpretation: Sinus rhythm Ventricular premature complex Probable left atrial enlargement LAD, consider left anterior fascicular block NO SIGNIFICANT CHANGE SINCE LAST TRACING YESTERDAY Confirmed by Thayer Jew [phone]) on 11/29/2021 3:07:41 AM  Radiology No results found.  Procedures Procedures    Medications Ordered in ED Medications - No data to display  ED Course/ Medical Decision Making/ A&P                           Medical Decision Making  This patient presents to the ED for concern of high blood pressure, this involves an extensive number of treatment options, and is a complaint that carries  with it a high risk of complications and morbidity.  I considered the following differential and admission for this acute, potentially life threatening condition.  The differential diagnosis includes asymptomatic hypertension, hypertensive urgency, hypertensive emergency, heart failure, dependent edema  MDM:    This is an 88 year old male who presents with concerns for high blood pressure.  He is asymptomatic.  He has not taken his blood pressure in several months.  Reports previously getting on blood pressure medication and a diuretic.  He does have some lower extremity edema.  He is not short of breath or hypoxic.  Vital signs notable for blood pressure of 170/91.  EKG without acute ischemia or arrhythmia.  Basic lab work-up is largely reassuring.  I had a long discussion with the patient.  Doubt hypertensive urgency or emergency.  Discussed that given that he is asymptomatic, I would not acutely treat his blood pressure.  I did offer to give him a prescription for HCTZ given his lower extremity swelling; however, patient would like to discuss with his primary physician and will call tomorrow.  Feel this is reasonable.  He does not appear to be in acute decompensated heart failure.  Patient was reassured.  (Labs, imaging, consults)  Labs: I Ordered, and personally interpreted labs.  The pertinent results include: CBC, BMP  Imaging Studies ordered: I ordered imaging studies including none I independently visualized and interpreted imaging. I agree with the radiologist interpretation  Additional history obtained from chart review.  External records from outside source obtained and reviewed including prior evaluations  Cardiac Monitoring: The patient was maintained on a cardiac monitor.  I personally viewed and interpreted the cardiac monitored which showed an underlying rhythm of: Sinus rhythm  Reevaluation: After the interventions noted above, I reevaluated the patient and found that they have  :stayed the same  Social Determinants of Health: Lives independently  Disposition: Discharge  Co morbidities that complicate the patient evaluation  Past Medical History:  Diagnosis Date   Abnormal stress electrocardiogram test Jan 2013   Negative cardiac catheterization.    Diabetes mellitus, type 2 ([place])    Dr Altheimer   Diverticulosis    GERD (gastroesophageal reflux disease)    Hiatal hernia    Hyperlipidemia    Hypertension    Normal cardiac stress test Jan 2009   Normal ETT   Palpitations    Negative holter in 2009 except for tachycardia with bicycling     Medicines No orders of the defined types were placed in this encounter.   I have reviewed the patients home medicines and have made adjustments as needed  Problem List / ED Course: Problem List Items Addressed This Visit   None Visit Diagnoses     Essential hypertension    -  Primary   Lower extremity edema                       Final Clinical Impression(s) / ED Diagnoses Final diagnoses:  Essential hypertension  Lower extremity edema    Rx / DC Orders ED Discharge Orders     None         Merryl Hacker, MD 11/29/21 [phone]

## 2021-12-03 ENCOUNTER — Encounter: Payer: Self-pay | Admitting: Family Medicine

## 2021-12-03 ENCOUNTER — Ambulatory Visit (INDEPENDENT_AMBULATORY_CARE_PROVIDER_SITE_OTHER): Payer: Medicare Other | Admitting: Family Medicine

## 2021-12-03 VITALS — BP 111/69 | HR 93 | Temp 98.0°F | Ht 70.0 in | Wt 167.2 lb

## 2021-12-03 DIAGNOSIS — M7989 Other specified soft tissue disorders: Secondary | ICD-10-CM

## 2021-12-03 DIAGNOSIS — E1159 Type 2 diabetes mellitus with other circulatory complications: Secondary | ICD-10-CM

## 2021-12-03 DIAGNOSIS — G4700 Insomnia, unspecified: Secondary | ICD-10-CM

## 2021-12-03 DIAGNOSIS — I152 Hypertension secondary to endocrine disorders: Secondary | ICD-10-CM

## 2021-12-03 DIAGNOSIS — R002 Palpitations: Secondary | ICD-10-CM | POA: Diagnosis not present

## 2021-12-03 DIAGNOSIS — N401 Enlarged prostate with lower urinary tract symptoms: Secondary | ICD-10-CM

## 2021-12-03 NOTE — Assessment & Plan Note (Signed)
No longer having effectiveness with melatonin.  Symptoms seem to be worsening over the last 1 to 2 weeks.  Discussed sleep hygiene measures including reduction of caffeine consumption.  He can try taking over-the-counter Unisom.  He will let me know if not improving.

## 2021-12-03 NOTE — Assessment & Plan Note (Signed)
Secondary to PVCs.  Found incidentally on EKG in the ED.  Reassured patient.

## 2021-12-03 NOTE — Assessment & Plan Note (Signed)
Stable on Cardura 8 mg daily.  We will try taking this in the morning to see if this helps stabilize blood pressure readings.

## 2021-12-03 NOTE — Assessment & Plan Note (Addendum)
Blood pressure well controlled today off medications.  H Home readings have been well controlled as well.  We discussed potential restarting antihypertensives however given his good control today we will continue with watchful waiting.  He will continue to monitor at home and let us know if persistently elevated to 150/90 or higher.  We also discussed reasons to return to care or seek emergent care for extremely elevated blood pressure readings of 180/120 or higher or if he develops any symptoms such as chest pain, shortness of breath, headache, numbness, tingling, etc.  He will follow-up in a few weeks via MyChart.

## 2021-12-03 NOTE — Patient Instructions (Signed)
It was very nice to see you today!  Please try taking Unisom to see if this helps with your sleep.  Please keep an eye on your blood pressure let me know if it is persistently elevated.  Take care, Dr [name]  PLEASE NOTE:  If you had any lab tests please let us know if you have not heard back within a few days. You may see your results on mychart before we have a chance to review them but we will give you a call once they are reviewed by [place]. If we ordered any referrals today, please let us know if you have not heard from their office within the next week.   Please try these tips to maintain a healthy lifestyle:  Eat at least 3 REAL meals and 1-2 snacks per day.  Aim for no more than 5 hours between eating.  If you eat breakfast, please do so within one hour of getting up.   Each meal should contain half fruits/vegetables, one quarter protein, and one quarter carbs (no bigger than a computer mouse)  Cut down on sweet beverages. This includes juice, soda, and sweet tea.   Drink at least 1 glass of water with each meal and aim for at least 8 glasses per day  Exercise at least 150 minutes every week.

## 2021-12-03 NOTE — Progress Notes (Signed)
   Scott Herman is a 88 y.o. male who presents today for an office visit.  Assessment/Plan:  Chronic Problems Addressed Today: Hypertension associated with diabetes ([place]) Blood pressure well controlled today off medications.  H Home readings have been well controlled as well.  We discussed potential restarting antihypertensives however given his good control today we will continue with watchful waiting.  He will continue to monitor at home and let us know if persistently elevated to 150/90 or higher.  We also discussed reasons to return to care or seek emergent care for extremely elevated blood pressure readings of 180/120 or higher or if he develops any symptoms such as chest pain, shortness of breath, headache, numbness, tingling, etc.  We will follow-up in a few weeks via MyChart.  Insomnia No longer having effectiveness with melatonin.  Symptoms seem to be worsening over the last 1 to 2 weeks.  Discussed sleep hygiene measures including reduction of caffeine consumption.  He can try taking over-the-counter Unisom.  He will let me know if not improving.  Palpitations Secondary to PVCs.  Found incidentally on EKG in the ED.  Reassured patient.  BPH (benign prostatic hyperplasia) Stable on Cardura 8 mg daily.  We will try taking this in the morning to see if this helps stabilize blood pressure readings.     Subjective:  HPI:  Patient here today for ED follow up.  Went to the ED 4 days ago with concern for high blood pressure reading.  Found that his blood pressure at home was 168/97.  Blood pressure in the ED was 170/91.  Had labs and EKG which was reassuring.  Was not started on any medications.  He was discharged home.  He has done well since being home. No chest pain or shortness of breath.   See a/p for status of chronic conditions.        Objective:  Physical Exam: BP 111/69   Pulse 93   Temp 98 F (36.7 C) (Temporal)   Ht '5\' 10"'$  (1.778 m)   Wt 167 lb 3.2 oz (75.8 kg)    SpO2 96%   BMI 23.99 kg/m   Gen: No acute distress, resting comfortably CV: Regular rate and rhythm with no murmurs appreciated Pulm: Normal work of breathing, clear to auscultation bilaterally with no crackles, wheezes, or rhonchi Neuro: Grossly normal, moves all extremities Psych: Normal affect and thought content  Time Spent: 45 minutes of total time was spent on the date of the encounter performing the following actions: chart review prior to seeing the patient including recent ED visit, obtaining history, performing a medically necessary exam, counseling on the treatment plan, placing orders, and documenting in our EHR.        Scott Herman. [name], MD 12/03/2021 10:37 AM
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
## 2021-12-13 ENCOUNTER — Telehealth: Payer: Self-pay

## 2021-12-13 NOTE — Telephone Encounter (Signed)
        Patient  visited [facility] on 11/29/2021  for Essential (primary) hypertension.   Telephone encounter attempt :  1st  A HIPAA compliant voice message was left requesting a return call.  Instructed patient to call back at [phone].   [place] Resource Care Guide   ??[email or web address]  ?? [phone]   Website: [email or web address]  [email or web address]

## 2021-12-15 ENCOUNTER — Telehealth: Payer: Self-pay

## 2021-12-15 NOTE — Telephone Encounter (Signed)
     Patient  visit on 11/29/2021  at [facility] was for Essential (primary) hypertension.  Have you been able to follow up with your primary care physician? Yes  The patient was or was not able to obtain any needed medicine or equipment. No meds were prescribed.  Are there diet recommendations that you are having difficulty following?No  Patient expresses understanding of discharge instructions and education provided has no other needs at this time.    [place] Resource Care Guide   ??[email or web address]  ?? [phone]   Website: [email or web address]  [email or web address]

## 2022-01-24 ENCOUNTER — Ambulatory Visit (INDEPENDENT_AMBULATORY_CARE_PROVIDER_SITE_OTHER): Payer: Medicare Other

## 2022-01-24 VITALS — Wt 167.0 lb

## 2022-01-24 DIAGNOSIS — Z0000 Encounter for general adult medical examination without abnormal findings: Secondary | ICD-10-CM

## 2022-01-24 NOTE — Patient Instructions (Signed)
Scott Herman , Thank you for taking time to come for your Medicare Wellness Visit. I appreciate your ongoing commitment to your health goals. Please review the following plan we discussed and let me know if I can assist you in the future.   These are the goals we discussed:  Goals      Patient Stated     More exercise     Patient Stated     More exercise      Weight (lb) < 170 lb (77.1 kg)     Would like to lose 7 lbs by increasing activity.         This is a list of the screening recommended for you and due dates:  Health Maintenance  Topic Date Due   Pneumonia Vaccine (2 - PPSV23 or PCV20) 02/17/2017   Complete foot exam   05/17/2018   Hemoglobin A1C  05/13/2019   Eye exam for diabetics  09/18/2019   COVID-19 Vaccine (7 - 2023-24 season) 10/29/2021   Zoster (Shingles) Vaccine (2 of 2) 03/05/2022*   Medicare Annual Wellness Visit  01/25/2023   Flu Shot  Completed   HPV Vaccine  Aged Out  *Topic was postponed. The date shown is not the original due date.    Advanced directives: Please bring a copy of your health care power of attorney and living will to the office at your convenience.  Conditions/risks identified: stay healthy and active   Next appointment: Follow up in one year for your annual wellness visit.   Preventive Care 62 Years and Older, Male  Preventive care refers to lifestyle choices and visits with your health care provider that can promote health and wellness. What does preventive care include? A yearly physical exam. This is also called an annual well check. Dental exams once or twice a year. Routine eye exams. Ask your health care provider how often you should have your eyes checked. Personal lifestyle choices, including: Daily care of your teeth and gums. Regular physical activity. Eating a healthy diet. Avoiding tobacco and drug use. Limiting alcohol use. Practicing safe sex. Taking low doses of aspirin every day. Taking vitamin and mineral  supplements as recommended by your health care provider. What happens during an annual well check? The services and screenings done by your health care provider during your annual well check will depend on your age, overall health, lifestyle risk factors, and family history of disease. Counseling  Your health care provider may ask you questions about your: Alcohol use. Tobacco use. Drug use. Emotional well-being. Home and relationship well-being. Sexual activity. Eating habits. History of falls. Memory and ability to understand (cognition). Work and work Statistician. Screening  You may have the following tests or measurements: Height, weight, and BMI. Blood pressure. Lipid and cholesterol levels. These may be checked every 5 years, or more frequently if you are over 33 years old. Skin check. Lung cancer screening. You may have this screening every year starting at age 48 if you have a 30-pack-year history of smoking and currently smoke or have quit within the past 15 years. Fecal occult blood test (FOBT) of the stool. You may have this test every year starting at age 14. Flexible sigmoidoscopy or colonoscopy. You may have a sigmoidoscopy every 5 years or a colonoscopy every 10 years starting at age 32. Prostate cancer screening. Recommendations will vary depending on your family history and other risks. Hepatitis C blood test. Hepatitis B blood test. Sexually transmitted disease (STD) testing. Diabetes screening. This  is done by checking your blood sugar (glucose) after you have not eaten for a while (fasting). You may have this done every 1-3 years. Abdominal aortic aneurysm (AAA) screening. You may need this if you are a current or former smoker. Osteoporosis. You may be screened starting at age 34 if you are at high risk. Talk with your health care provider about your test results, treatment options, and if necessary, the need for more tests. Vaccines  Your health care provider  may recommend certain vaccines, such as: Influenza vaccine. This is recommended every year. Tetanus, diphtheria, and acellular pertussis (Tdap, Td) vaccine. You may need a Td booster every 10 years. Zoster vaccine. You may need this after age 36. Pneumococcal 13-valent conjugate (PCV13) vaccine. One dose is recommended after age 56. Pneumococcal polysaccharide (PPSV23) vaccine. One dose is recommended after age 45. Talk to your health care provider about which screenings and vaccines you need and how often you need them. This information is not intended to replace advice given to you by your health care provider. Make sure you discuss any questions you have with your health care provider. Document Released: 03/13/2015 Document Revised: 11/04/2015 Document Reviewed: 12/16/2014 Elsevier Interactive Patient Education  2017 [place] Prevention in the Home Falls can cause injuries. They can happen to people of all ages. There are many things you can do to make your home safe and to help prevent falls. What can I do on the outside of my home? Regularly fix the edges of walkways and driveways and fix any cracks. Remove anything that might make you trip as you walk through a door, such as a raised step or threshold. Trim any bushes or trees on the path to your home. Use bright outdoor lighting. Clear any walking paths of anything that might make someone trip, such as rocks or tools. Regularly check to see if handrails are loose or broken. Make sure that both sides of any steps have handrails. Any raised decks and porches should have guardrails on the edges. Have any leaves, snow, or ice cleared regularly. Use sand or salt on walking paths during winter. Clean up any spills in your garage right away. This includes oil or grease spills. What can I do in the bathroom? Use night lights. Install grab bars by the toilet and in the tub and shower. Do not use towel bars as grab bars. Use  non-skid mats or decals in the tub or shower. If you need to sit down in the shower, use a plastic, non-slip stool. Keep the floor dry. Clean up any water that spills on the floor as soon as it happens. Remove soap buildup in the tub or shower regularly. Attach bath mats securely with double-sided non-slip rug tape. Do not have throw rugs and other things on the floor that can make you trip. What can I do in the bedroom? Use night lights. Make sure that you have a light by your bed that is easy to reach. Do not use any sheets or blankets that are too big for your bed. They should not hang down onto the floor. Have a firm chair that has side arms. You can use this for support while you get dressed. Do not have throw rugs and other things on the floor that can make you trip. What can I do in the kitchen? Clean up any spills right away. Avoid walking on wet floors. Keep items that you use a lot in easy-to-reach places. If you  need to reach something above you, use a strong step stool that has a grab bar. Keep electrical cords out of the way. Do not use floor polish or wax that makes floors slippery. If you must use wax, use non-skid floor wax. Do not have throw rugs and other things on the floor that can make you trip. What can I do with my stairs? Do not leave any items on the stairs. Make sure that there are handrails on both sides of the stairs and use them. Fix handrails that are broken or loose. Make sure that handrails are as long as the stairways. Check any carpeting to make sure that it is firmly attached to the stairs. Fix any carpet that is loose or worn. Avoid having throw rugs at the top or bottom of the stairs. If you do have throw rugs, attach them to the floor with carpet tape. Make sure that you have a light switch at the top of the stairs and the bottom of the stairs. If you do not have them, ask someone to add them for you. What else can I do to help prevent falls? Wear  shoes that: Do not have high heels. Have rubber bottoms. Are comfortable and fit you well. Are closed at the toe. Do not wear sandals. If you use a stepladder: Make sure that it is fully opened. Do not climb a closed stepladder. Make sure that both sides of the stepladder are locked into place. Ask someone to hold it for you, if possible. Clearly mark and make sure that you can see: Any grab bars or handrails. First and last steps. Where the edge of each step is. Use tools that help you move around (mobility aids) if they are needed. These include: Canes. Walkers. Scooters. Crutches. Turn on the lights when you go into a dark area. Replace any light bulbs as soon as they burn out. Set up your furniture so you have a clear path. Avoid moving your furniture around. If any of your floors are uneven, fix them. If there are any pets around you, be aware of where they are. Review your medicines with your doctor. Some medicines can make you feel dizzy. This can increase your chance of falling. Ask your doctor what other things that you can do to help prevent falls. This information is not intended to replace advice given to you by your health care provider. Make sure you discuss any questions you have with your health care provider. Document Released: 12/11/2008 Document Revised: 07/23/2015 Document Reviewed: 03/21/2014 Elsevier Interactive Patient Education  2017 [employer].

## 2022-01-24 NOTE — Progress Notes (Signed)
I connected with  [name] on 01/24/22 by a audio enabled telemedicine application and verified that I am speaking with the correct person using two identifiers.  Patient Location: Home  Provider Location: Office/Clinic  I discussed the limitations of evaluation and management by telemedicine. The patient expressed understanding and agreed to proceed.  Subjective:   Scott Herman is a 88 y.o. male who presents for Medicare Annual/Subsequent preventive examination.  Review of Systems     Cardiac Risk Factors include: advanced age (>52mn, >>47women);diabetes mellitus;male gender;hypertension     Objective:    Today's Vitals   01/24/22 1113  Weight: 167 lb (75.8 kg)   Body mass index is 23.96 kg/m.     11/28/2021   10:43 PM 09/07/2021    3:01 PM 01/15/2021    8:18 AM 01/05/2021   12:29 PM 12/10/2020    3:12 PM 01/30/2020    5:12 PM 11/28/2019    1:34 PM  Advanced Directives  Does Patient Have a Medical Advance Directive? No Yes Yes Yes No No Yes  Type of Advance Directive  Living will Healthcare Power of [place]  Copy of [place] Chart?   No - copy requested    No - copy requested  Would patient like information on creating a medical advance directive? No - Patient declined     No - Patient declined     Current Medications (verified) Outpatient Encounter Medications as of 01/24/2022  Medication Sig   acetaminophen (TYLENOL) 650 MG CR tablet Take 1,300 mg by mouth every 8 (eight) hours as needed for pain.   aspirin 81 MG tablet Take 1 tablet (81 mg total) by mouth at bedtime.   B-D UF III MINI PEN NEEDLES 31G X 5 MM MISC SMARTSIG:1 Needle SUB-Q Daily   Blood Glucose Calibration (OT ULTRA/FASTTK CNTRL SOLN) SOLN Use to test blood sugars (Dx: E11.9)   ciclopirox (PENLAC) 8 % solution Apply topically at bedtime. Apply over nail and surrounding skin. Apply daily over previous coat. Remove weekly with  file or polish remover.   CINNAMON PO Take 2,000 mg by mouth at bedtime.   co-enzyme Q-10 30 MG capsule Take 30 mg by mouth daily.   Continuous Blood Gluc Receiver (FREESTYLE LIBRE 2 READER) DEVI SCAN AS NEEDED FOR CONTINUOUS GLUCOSE MONITORING   doxazosin (CARDURA) 4 MG tablet Take 2 tablets by mouth once daily   esomeprazole (NEXIUM) 40 MG capsule Take 1 capsule (40 mg total) by mouth daily before breakfast.   glucose blood (ONETOUCH VERIO) test strip Use to test blood sugars 4 times a day or as directed (Dx: E11.65)   Insulin Glargine (TOUJEO SOLOSTAR [place]) Inject 14 Units into the skin every morning.   metFORMIN (GLUCOPHAGE-XR) 750 MG 24 hr tablet Take 750 mg by mouth 2 (two) times daily.   Multiple Vitamins-Minerals (MENS 50+ MULTI VITAMIN/MIN) TABS Take 1 tablet by mouth daily.   pioglitazone (ACTOS) 45 MG tablet Take 45 mg by mouth daily.   pravastatin (PRAVACHOL) 80 MG tablet Take 80 mg by mouth at bedtime.   No facility-administered encounter medications on file as of 01/24/2022.    Allergies (verified) Patient has no known allergies.   History: Past Medical History:  Diagnosis Date   Abnormal stress electrocardiogram test Jan 2013   Negative cardiac catheterization.    Diabetes mellitus, type 2 ([place]    Dr Altheimer   Diverticulosis    GERD (gastroesophageal  reflux disease)    Hiatal hernia    Hyperlipidemia    Hypertension    Normal cardiac stress test Jan 2009   Normal ETT   Palpitations    Negative holter in 2009 except for tachycardia with bicycling   Past Surgical History:  Procedure Laterality Date   CARDIAC CATHETERIZATION  Feb 2013   Mild nonobstructive CAD. Normal LV function   CHOLECYSTECTOMY     COLONOSCOPY     Dr Fuller Plan   COLONOSCOPY WITH PROPOFOL N/A 12/22/2016   Procedure: COLONOSCOPY WITH PROPOFOL;  Surgeon: Gatha Mayer, MD;  Location: [facility];  Service: Endoscopy;  Laterality: N/A;   HEMORRHOID SURGERY     INGUINAL HERNIA REPAIR     ORIF  CLAVICULAR FRACTURE     left   TONSILLECTOMY     Family History  Problem Relation Age of Onset   Prostate cancer Father    Diabetes Father    Diverticulosis Father    Hypertension Brother    Diabetes Brother    Prostate cancer Paternal Grandfather    Diabetes Paternal Grandmother    Colon cancer Neg Hx    Social History   Socioeconomic History   Marital status: Married    Spouse name: Not on file   Number of children: Not on file   Years of education: Not on file   Highest education level: Not on file  Occupational History   Occupation: Retired  Tobacco Use   Smoking status: Never   Smokeless tobacco: Never  Vaping Use   Vaping Use: Never used  Substance and Sexual Activity   Alcohol use: No    Alcohol/week: 0.0 standard drinks of alcohol   Drug use: No   Sexual activity: Yes  Other Topics Concern   Not on file  Social History Narrative   Not on file   Social Determinants of Health   Financial Resource Strain: Low Risk  (01/24/2022)   Overall Financial Resource Strain (CARDIA)    Difficulty of Paying Living Expenses: Not hard at all  Food Insecurity: No Food Insecurity (01/24/2022)   Hunger Vital Sign    Worried About Running Out of Food in the Last Year: Never true    [place] in the Last Year: Never true  Transportation Needs: No Transportation Needs (01/24/2022)   PRAPARE - Hydrologist (Medical): No    Lack of Transportation (Non-Medical): No  Physical Activity: Sufficiently Active (01/24/2022)   Exercise Vital Sign    Days of Exercise per Week: 5 days    Minutes of Exercise per Session: 30 min  Stress: No Stress Concern Present (01/24/2022)   [place]    Feeling of Stress : Not at all  Social Connections: Moderately Integrated (01/24/2022)   Social Connection and Isolation Panel [NHANES]    Frequency of Communication with Friends and Family: More  than three times a week    Frequency of Social Gatherings with Friends and Family: More than three times a week    Attends Religious Services: More than 4 times per year    Active Member of [employer] or Organizations: No    Attends Archivist Meetings: Never    Marital Status: Married    Tobacco Counseling Counseling given: Not Answered   Clinical Intake:  Pre-visit preparation completed: Yes  Pain : No/denies pain     BMI - recorded: 23.96 Nutritional Status: BMI of 19-24  Normal  Nutritional Risks: None Diabetes: Yes CBG done?: Yes (87 per pt) CBG resulted in Enter/ Edit results?: No Did pt. bring in CBG monitor from home?: No  How often do you need to have someone help you when you read instructions, pamphlets, or other written materials from your doctor or pharmacy?: 1 - Never  Diabetic?Nutrition Risk Assessment:  Has the patient had any N/V/D within the last 2 months?  No  Does the patient have any non-healing wounds?  No  Has the patient had any unintentional weight loss or weight gain?  No   Diabetes:  Is the patient diabetic?  Yes  If diabetic, was a CBG obtained today?  Yes  Did the patient bring in their glucometer from home?  No  How often do you monitor your CBG's? Free style libra monitor .   Financial Strains and Diabetes Management:  Are you having any financial strains with the device, your supplies or your medication? No .  Does the patient want to be seen by Chronic Care Management for management of their diabetes?  No  Would the patient like to be referred to a Nutritionist or for Diabetic Management?  No   Diabetic Exams:  Diabetic Eye Exam: Overdue for diabetic eye exam. Pt has been advised about the importance in completing this exam. Patient advised to call and schedule an eye exam. Diabetic Foot Exam: Overdue, Pt has been advised about the importance in completing this exam. Pt is scheduled for diabetic foot exam on next appt  .   Interpreter Needed?: No  Information entered by :: Charlott Rakes, LPN   Activities of Daily Living    01/24/2022   11:21 AM  In your present state of health, do you have any difficulty performing the following activities:  Hearing? 0  Vision? 0  Difficulty concentrating or making decisions? 0  Walking or climbing stairs? 0  Dressing or bathing? 0  Doing errands, shopping? 0  Preparing Food and eating ? N  Using the Toilet? N  In the past six months, have you accidently leaked urine? N  Do you have problems with loss of bowel control? N  Managing your Medications? N  Managing your Finances? N  Housekeeping or managing your Housekeeping? N    Patient Care Team: Vivi Barrack, MD as PCP - General (Family Medicine) Festus Aloe, MD as Consulting Physician (Urology) Minus Breeding, MD as Consulting Physician (Cardiology) Ladene Artist, MD as Consulting Physician (Gastroenterology) Altheimer, Legrand Como, MD as Consulting Physician (Endocrinology) Hayden Pedro, MD as Consulting Physician (Ophthalmology) Manson Passey, [place] (Specialist) Lavonna Monarch, MD (Inactive) as Consulting Physician (Dermatology)  Indicate any recent Medical Services you may have received from other than [facility] providers in the past year (date may be approximate).     Assessment:   This is a routine wellness examination for Yassir.  Hearing/Vision screen Hearing Screening - Comments:: Pt denies any hearing issues  Vision Screening - Comments:: Pt follows with Dr Tempie Hoist for annul eye exams   Dietary issues and exercise activities discussed: Current Exercise Habits: Home exercise routine, Type of exercise: walking;Other - see comments, Time (Minutes): 30, Frequency (Times/Week): 5, Weekly Exercise (Minutes/Week): 150   Goals Addressed             This Visit's Progress    Patient Stated       Stay healthy and active        Depression Screen    01/24/2022   11:19 AM  12/03/2021  9:55 AM 01/15/2021    8:17 AM 10/06/2020    2:08 PM 11/28/2019    1:33 PM 01/22/2019    2:04 PM 11/09/2018    1:41 PM  PHQ 2/9 Scores  PHQ - 2 Score 0 0 0 0 0 0 0    Fall Risk    01/24/2022   11:21 AM 12/03/2021    9:55 AM 09/07/2021    3:00 PM 01/15/2021    8:21 AM 01/05/2021   12:29 PM  [place] in the past year? 0 0 0 0 1  Number falls in past yr: 0 0 0 0 0  Injury with Fall? 0 0 0 0 0  Risk for fall due to : Impaired vision No Fall Risks  Impaired vision   Follow up Falls prevention discussed   Falls prevention discussed     FALL RISK PREVENTION PERTAINING TO THE HOME:  Any stairs in or around the home? No  If so, are there any without handrails? No  Home free of loose throw rugs in walkways, pet beds, electrical cords, etc? Yes  Adequate lighting in your home to reduce risk of falls? Yes   ASSISTIVE DEVICES UTILIZED TO PREVENT FALLS:  Life alert? No  Use of a cane, walker or w/c? No  Grab bars in the bathroom? No  Shower chair or bench in shower? Yes  Elevated toilet seat or a handicapped toilet? No   TIMED UP AND GO:  Was the test performed? No .   Cognitive Function:        01/24/2022   11:23 AM 01/15/2021    8:22 AM 11/28/2019    1:29 PM 11/09/2018    1:41 PM 10/27/2017    3:28 PM  6CIT Screen  What Year? 0 points 0 points 0 points 0 points 0 points  What month? 0 points 0 points 0 points 0 points 0 points  What time? 0 points 0 points  0 points 0 points  Count back from 20 0 points 0 points 0 points 0 points 0 points  Months in reverse 0 points 0 points 0 points 0 points 0 points  Repeat phrase 0 points 0 points 0 points 0 points   Total Score 0 points 0 points  0 points     Immunizations Immunization History  Administered Date(s) Administered   Fluad Quad(high Dose 65+) 11/09/2018, 12/04/2020, 11/12/2021   Hepatitis A 06/18/1998, 06/08/1999   IPV 06/08/1999   Influenza, High Dose Seasonal PF 01/13/2014, 12/08/2017,  11/09/2018, 11/15/2019   Influenza-Unspecified 12/08/2014, 01/04/2016, 11/28/2016, 11/30/2020   PFIZER Comirnaty(Gray Top)Covid-19 Tri-Sucrose Vaccine 03/20/2019, 04/10/2019, 11/29/2019   PFIZER(Purple Top)SARS-COV-2 Vaccination 03/20/2019, 04/10/2019, 11/29/2019   Pneumococcal Conjugate-13 02/18/2016   Typhoid Inactivated 06/18/1998   Zoster Recombinat (Shingrix) 01/24/2006   Zoster, Live 02/26/2010      Flu Vaccine status: Up to date  Pneumococcal vaccine status: Due, Education has been provided regarding the importance of this vaccine. Advised may receive this vaccine at local pharmacy or Health Dept. Aware to provide a copy of the vaccination record if obtained from local pharmacy or Health Dept. Verbalized acceptance and understanding.  Covid-19 vaccine status: Completed vaccines  Qualifies for Shingles Vaccine? Yes   Zostavax completed No   Shingrix Completed?: No.    Education has been provided regarding the importance of this vaccine. Patient has been advised to call insurance company to determine out of pocket expense if they have not yet received this vaccine. Advised may also receive vaccine  at local pharmacy or Health Dept. Verbalized acceptance and understanding.  Screening Tests Health Maintenance  Topic Date Due   Pneumonia Vaccine 56+ Years old (2 - PPSV23 or PCV20) 02/17/2017   FOOT EXAM  05/17/2018   HEMOGLOBIN A1C  05/13/2019   OPHTHALMOLOGY EXAM  09/18/2019   COVID-19 Vaccine (7 - 2023-24 season) 10/29/2021   Zoster Vaccines- Shingrix (2 of 2) 03/05/2022 (Originally 03/21/2006)   Medicare Annual Wellness (AWV)  01/25/2023   INFLUENZA VACCINE  Completed   HPV VACCINES  Aged Out    Health Maintenance  Health Maintenance Due  Topic Date Due   Pneumonia Vaccine 35+ Years old (2 - PPSV23 or PCV20) 02/17/2017   FOOT EXAM  05/17/2018   HEMOGLOBIN A1C  05/13/2019   OPHTHALMOLOGY EXAM  09/18/2019   COVID-19 Vaccine (7 - 2023-24 season) 10/29/2021    Colorectal  cancer screening: No longer required.   Additional Screening:   Vision Screening: Recommended annual ophthalmology exams for early detection of glaucoma and other disorders of the eye. Is the patient up to date with their annual eye exam?  No  Who is the provider or what is the name of the office in which the patient attends annual eye exams? Encouraged to follow up for appt  If pt is not established with a provider, would they like to be referred to a provider to establish care? No .   Dental Screening: Recommended annual dental exams for proper oral hygiene  Community Resource Referral / Chronic Care Management: CRR required this visit?  No   CCM required this visit?  No      Plan:     I have personally reviewed and noted the following in the patient's chart:   Medical and social history Use of alcohol, tobacco or illicit drugs  Current medications and supplements including opioid prescriptions. Patient is not currently taking opioid prescriptions. Functional ability and status Nutritional status Physical activity Advanced directives List of other physicians Hospitalizations, surgeries, and ER visits in previous 12 months Vitals Screenings to include cognitive, depression, and falls Referrals and appointments  In addition, I have reviewed and discussed with patient certain preventive protocols, quality metrics, and best practice recommendations. A written personalized care plan for preventive services as well as general preventive health recommendations were provided to patient.     Willette Brace, LPN   [id number]   Nurse Notes: none

## 2022-01-26 DIAGNOSIS — H2513 Age-related nuclear cataract, bilateral: Secondary | ICD-10-CM | POA: Diagnosis not present

## 2022-01-26 DIAGNOSIS — H25042 Posterior subcapsular polar age-related cataract, left eye: Secondary | ICD-10-CM | POA: Diagnosis not present

## 2022-01-26 DIAGNOSIS — E113293 Type 2 diabetes mellitus with mild nonproliferative diabetic retinopathy without macular edema, bilateral: Secondary | ICD-10-CM | POA: Diagnosis not present

## 2022-01-26 DIAGNOSIS — H25013 Cortical age-related cataract, bilateral: Secondary | ICD-10-CM | POA: Diagnosis not present

## 2022-02-03 ENCOUNTER — Ambulatory Visit: Payer: Medicare Other | Admitting: Family Medicine

## 2022-02-08 DIAGNOSIS — H5703 Miosis: Secondary | ICD-10-CM | POA: Diagnosis not present

## 2022-02-08 DIAGNOSIS — H21561 Pupillary abnormality, right eye: Secondary | ICD-10-CM | POA: Diagnosis not present

## 2022-02-08 DIAGNOSIS — H268 Other specified cataract: Secondary | ICD-10-CM | POA: Diagnosis not present

## 2022-02-08 DIAGNOSIS — H2511 Age-related nuclear cataract, right eye: Secondary | ICD-10-CM | POA: Diagnosis not present

## 2022-02-13 ENCOUNTER — Other Ambulatory Visit: Payer: Self-pay | Admitting: Family Medicine

## 2022-03-02 ENCOUNTER — Ambulatory Visit (INDEPENDENT_AMBULATORY_CARE_PROVIDER_SITE_OTHER): Payer: Medicare Other | Admitting: Podiatry

## 2022-03-02 DIAGNOSIS — E119 Type 2 diabetes mellitus without complications: Secondary | ICD-10-CM | POA: Diagnosis not present

## 2022-03-02 DIAGNOSIS — B351 Tinea unguium: Secondary | ICD-10-CM | POA: Diagnosis not present

## 2022-03-02 DIAGNOSIS — M79675 Pain in left toe(s): Secondary | ICD-10-CM

## 2022-03-02 DIAGNOSIS — E0843 Diabetes mellitus due to underlying condition with diabetic autonomic (poly)neuropathy: Secondary | ICD-10-CM

## 2022-03-02 DIAGNOSIS — M79674 Pain in right toe(s): Secondary | ICD-10-CM | POA: Diagnosis not present

## 2022-03-02 NOTE — Progress Notes (Signed)
Patient presents to the office today for diabetic shoe and insole measuring.  Patient was measured with brannock device to determine size and width for 1 pair of extra depth shoes and foam casted for 3 pair of insoles.   Documentation of medical necessity will be sent to patient's treating diabetic doctor to verify and sign.   Patient's diabetic provider: Daine Floras, MD  Shoes and insoles will be ordered at that time and patient will be notified for an appointment for fitting when they arrive.   Shoe size (per patient): 12.5 men's   Brannock measurement: 12.5 medium men's  Patient shoe selection-   Shoe choice:   [id number] [employer]pex Athletic Walker   Shoe size ordered: 12.5 medium men's

## 2022-03-02 NOTE — Progress Notes (Signed)
   Chief Complaint  Patient presents with   Foot Problem    Patient has problem with a spot on  the right foot, 2nd digit    SUBJECTIVE Patient with a history of diabetes mellitus presents to office today complaining of elongated, thickened nails that cause pain while ambulating in shoes.  Patient is unable to trim their own nails. Patient is here for further evaluation and treatment.  Past Medical History:  Diagnosis Date   Abnormal stress electrocardiogram test Jan 2013   Negative cardiac catheterization.    Diabetes mellitus, type 2 ([place])    Dr Altheimer   Diverticulosis    GERD (gastroesophageal reflux disease)    Hiatal hernia    Hyperlipidemia    Hypertension    Normal cardiac stress test Jan 2009   Normal ETT   Palpitations    Negative holter in 2009 except for tachycardia with bicycling    No Known Allergies   RT foot 03/02/2022  OBJECTIVE General Patient is awake, alert, and oriented x 3 and in no acute distress. Derm Skin is dry and supple bilateral. Negative open lesions or macerations. Remaining integument unremarkable. Nails are tender, long, thickened and dystrophic with subungual debris, consistent with onychomycosis, 1-5 bilateral. No signs of infection noted.  2 small focal areas of scab that are very superficial and stable with routine healing to the DIPJ and PIPJ of the right second toe.  Please see above noted photo Vasc  DP and PT pedal pulses palpable bilaterally. Temperature gradient within normal limits.  Chronic edema noted right lower extremity compared to the contralateral limb up to the level of the knee Neuro Epicritic and protective threshold sensation diminished bilaterally.  Musculoskeletal Exam No symptomatic pedal deformities noted bilateral. Muscular strength within normal limits.  ASSESSMENT 1. Diabetes Mellitus w/ peripheral neuropathy 2.  Pain due to onychomycosis of toenails bilateral 3.  Superficial scab/wounds DIPJ and PIPJ right second  toe  PLAN OF CARE 1. Patient evaluated today.  Comprehensive diabetic foot exam performed today 2. Instructed to maintain good pedal hygiene and foot care. Stressed importance of controlling blood sugar.  3. Mechanical debridement of nails 1-5 bilaterally performed using a nail nipper. Filed with dremel without incident.  4.  In regards to the toe wounds, we will simply allow these to heal.  No intervention.  Will observe for now 5.  Today the patient was molded for diabetic shoes/insoles  6.  Return to clinic 3 months for routine footcare    Edrick Kins, DPM [facility]  Dr. Edrick Kins, DPM    [address], [place] [address]                Office [phone]  Fax [phone]

## 2022-03-03 DIAGNOSIS — L720 Epidermal cyst: Secondary | ICD-10-CM | POA: Diagnosis not present

## 2022-03-03 DIAGNOSIS — L821 Other seborrheic keratosis: Secondary | ICD-10-CM | POA: Diagnosis not present

## 2022-03-07 DIAGNOSIS — E785 Hyperlipidemia, unspecified: Secondary | ICD-10-CM | POA: Diagnosis not present

## 2022-03-07 DIAGNOSIS — E1142 Type 2 diabetes mellitus with diabetic polyneuropathy: Secondary | ICD-10-CM | POA: Diagnosis not present

## 2022-03-07 DIAGNOSIS — Z794 Long term (current) use of insulin: Secondary | ICD-10-CM | POA: Diagnosis not present

## 2022-03-07 DIAGNOSIS — E1165 Type 2 diabetes mellitus with hyperglycemia: Secondary | ICD-10-CM | POA: Diagnosis not present

## 2022-03-07 DIAGNOSIS — E1169 Type 2 diabetes mellitus with other specified complication: Secondary | ICD-10-CM | POA: Diagnosis not present

## 2022-03-07 DIAGNOSIS — E11649 Type 2 diabetes mellitus with hypoglycemia without coma: Secondary | ICD-10-CM | POA: Diagnosis not present

## 2022-03-09 DIAGNOSIS — H2512 Age-related nuclear cataract, left eye: Secondary | ICD-10-CM | POA: Diagnosis not present

## 2022-03-09 DIAGNOSIS — H25012 Cortical age-related cataract, left eye: Secondary | ICD-10-CM | POA: Diagnosis not present

## 2022-03-09 DIAGNOSIS — H25042 Posterior subcapsular polar age-related cataract, left eye: Secondary | ICD-10-CM | POA: Diagnosis not present

## 2022-03-11 DIAGNOSIS — E113299 Type 2 diabetes mellitus with mild nonproliferative diabetic retinopathy without macular edema, unspecified eye: Secondary | ICD-10-CM | POA: Diagnosis not present

## 2022-03-11 DIAGNOSIS — N182 Chronic kidney disease, stage 2 (mild): Secondary | ICD-10-CM | POA: Diagnosis not present

## 2022-03-11 DIAGNOSIS — I129 Hypertensive chronic kidney disease with stage 1 through stage 4 chronic kidney disease, or unspecified chronic kidney disease: Secondary | ICD-10-CM | POA: Diagnosis not present

## 2022-03-11 DIAGNOSIS — E1122 Type 2 diabetes mellitus with diabetic chronic kidney disease: Secondary | ICD-10-CM | POA: Diagnosis not present

## 2022-03-11 DIAGNOSIS — Z794 Long term (current) use of insulin: Secondary | ICD-10-CM | POA: Diagnosis not present

## 2022-03-11 DIAGNOSIS — Z7984 Long term (current) use of oral hypoglycemic drugs: Secondary | ICD-10-CM | POA: Diagnosis not present

## 2022-03-11 DIAGNOSIS — E1136 Type 2 diabetes mellitus with diabetic cataract: Secondary | ICD-10-CM | POA: Diagnosis not present

## 2022-03-22 DIAGNOSIS — H5703 Miosis: Secondary | ICD-10-CM | POA: Diagnosis not present

## 2022-03-22 DIAGNOSIS — H25812 Combined forms of age-related cataract, left eye: Secondary | ICD-10-CM | POA: Diagnosis not present

## 2022-03-22 DIAGNOSIS — H2512 Age-related nuclear cataract, left eye: Secondary | ICD-10-CM | POA: Diagnosis not present

## 2022-03-23 LAB — HM DIABETES EYE EXAM

## 2022-04-27 ENCOUNTER — Ambulatory Visit (INDEPENDENT_AMBULATORY_CARE_PROVIDER_SITE_OTHER): Payer: Medicare Other

## 2022-04-27 DIAGNOSIS — E119 Type 2 diabetes mellitus without complications: Secondary | ICD-10-CM

## 2022-05-23 ENCOUNTER — Other Ambulatory Visit: Payer: Self-pay | Admitting: Family Medicine

## 2022-06-13 ENCOUNTER — Encounter: Payer: Self-pay | Admitting: *Deleted

## 2022-06-17 DIAGNOSIS — Z794 Long term (current) use of insulin: Secondary | ICD-10-CM | POA: Diagnosis not present

## 2022-06-17 DIAGNOSIS — N182 Chronic kidney disease, stage 2 (mild): Secondary | ICD-10-CM | POA: Diagnosis not present

## 2022-06-17 DIAGNOSIS — E1165 Type 2 diabetes mellitus with hyperglycemia: Secondary | ICD-10-CM | POA: Diagnosis not present

## 2022-06-17 DIAGNOSIS — E1122 Type 2 diabetes mellitus with diabetic chronic kidney disease: Secondary | ICD-10-CM | POA: Diagnosis not present

## 2022-06-21 DIAGNOSIS — E1165 Type 2 diabetes mellitus with hyperglycemia: Secondary | ICD-10-CM | POA: Diagnosis not present

## 2022-06-21 DIAGNOSIS — E1122 Type 2 diabetes mellitus with diabetic chronic kidney disease: Secondary | ICD-10-CM | POA: Diagnosis not present

## 2022-06-21 DIAGNOSIS — E1129 Type 2 diabetes mellitus with other diabetic kidney complication: Secondary | ICD-10-CM | POA: Diagnosis not present

## 2022-06-21 DIAGNOSIS — Z794 Long term (current) use of insulin: Secondary | ICD-10-CM | POA: Diagnosis not present

## 2022-06-21 DIAGNOSIS — E113293 Type 2 diabetes mellitus with mild nonproliferative diabetic retinopathy without macular edema, bilateral: Secondary | ICD-10-CM | POA: Diagnosis not present

## 2022-06-27 DIAGNOSIS — H40013 Open angle with borderline findings, low risk, bilateral: Secondary | ICD-10-CM | POA: Diagnosis not present

## 2022-06-27 LAB — HM DIABETES EYE EXAM

## 2022-07-07 DIAGNOSIS — K080 Exfoliation of teeth due to systemic causes: Secondary | ICD-10-CM | POA: Diagnosis not present

## 2022-07-22 DIAGNOSIS — L820 Inflamed seborrheic keratosis: Secondary | ICD-10-CM | POA: Diagnosis not present

## 2022-07-22 DIAGNOSIS — L821 Other seborrheic keratosis: Secondary | ICD-10-CM | POA: Diagnosis not present

## 2022-09-01 ENCOUNTER — Other Ambulatory Visit: Payer: Self-pay | Admitting: Family Medicine

## 2022-09-23 ENCOUNTER — Encounter (INDEPENDENT_AMBULATORY_CARE_PROVIDER_SITE_OTHER): Payer: Medicare Other | Admitting: Ophthalmology

## 2022-09-23 DIAGNOSIS — E1165 Type 2 diabetes mellitus with hyperglycemia: Secondary | ICD-10-CM | POA: Diagnosis not present

## 2022-09-23 DIAGNOSIS — Z794 Long term (current) use of insulin: Secondary | ICD-10-CM | POA: Diagnosis not present

## 2022-09-23 DIAGNOSIS — E1122 Type 2 diabetes mellitus with diabetic chronic kidney disease: Secondary | ICD-10-CM | POA: Diagnosis not present

## 2022-09-23 DIAGNOSIS — E1142 Type 2 diabetes mellitus with diabetic polyneuropathy: Secondary | ICD-10-CM | POA: Diagnosis not present

## 2022-09-23 DIAGNOSIS — E113293 Type 2 diabetes mellitus with mild nonproliferative diabetic retinopathy without macular edema, bilateral: Secondary | ICD-10-CM | POA: Diagnosis not present

## 2022-09-27 DIAGNOSIS — H524 Presbyopia: Secondary | ICD-10-CM | POA: Diagnosis not present

## 2022-09-27 DIAGNOSIS — H02403 Unspecified ptosis of bilateral eyelids: Secondary | ICD-10-CM | POA: Diagnosis not present

## 2022-09-27 DIAGNOSIS — E113293 Type 2 diabetes mellitus with mild nonproliferative diabetic retinopathy without macular edema, bilateral: Secondary | ICD-10-CM | POA: Diagnosis not present

## 2022-09-27 DIAGNOSIS — H353132 Nonexudative age-related macular degeneration, bilateral, intermediate dry stage: Secondary | ICD-10-CM | POA: Diagnosis not present

## 2022-09-27 DIAGNOSIS — H40013 Open angle with borderline findings, low risk, bilateral: Secondary | ICD-10-CM | POA: Diagnosis not present

## 2022-10-12 ENCOUNTER — Encounter (INDEPENDENT_AMBULATORY_CARE_PROVIDER_SITE_OTHER): Payer: Medicare Other | Admitting: Ophthalmology

## 2022-10-18 DIAGNOSIS — K080 Exfoliation of teeth due to systemic causes: Secondary | ICD-10-CM | POA: Diagnosis not present

## 2022-10-28 ENCOUNTER — Telehealth: Payer: Medicare Other | Admitting: Family Medicine

## 2022-10-28 DIAGNOSIS — E119 Type 2 diabetes mellitus without complications: Secondary | ICD-10-CM | POA: Diagnosis not present

## 2022-10-28 DIAGNOSIS — Z7984 Long term (current) use of oral hypoglycemic drugs: Secondary | ICD-10-CM | POA: Diagnosis not present

## 2022-10-28 DIAGNOSIS — E782 Mixed hyperlipidemia: Secondary | ICD-10-CM | POA: Diagnosis not present

## 2022-10-28 DIAGNOSIS — U071 COVID-19: Secondary | ICD-10-CM | POA: Diagnosis not present

## 2022-10-28 MED ORDER — NIRMATRELVIR/RITONAVIR (PAXLOVID)TABLET: 3.0000 | ORAL_TABLET | Freq: Two times a day (BID) | ORAL | 0 refills | Status: AC

## 2022-10-28 NOTE — Assessment & Plan Note (Signed)
Follows with endocrinology.  His A1c is a bit at goal on Actos, metformin, and insulin though does increase his risk for complication from COVID.

## 2022-10-28 NOTE — Assessment & Plan Note (Signed)
On pravastatin 80 milligrams daily.  He will hold this while on Paxlovid.

## 2022-10-28 NOTE — Progress Notes (Signed)
   Scott Herman is a 89 y.o. male who presents today for a virtual office visit.  Assessment/Plan:  New/Acute Problems: COVID No red flags though he is at increased risk for complication due to comorbidities including age, dyslipidemia, hypertension, and diabetes.  Will start paxlovid.  He will hold his pravastatin illness.  Encouraged hydration.  Can use over-the-counter meds as needed.  We discussed reasons to return to care.  Follow-up as needed.  Chronic Problems Addressed Today: Diabetes (HCC) Follows with endocrinology.  His A1c is a bit at goal on Actos, metformin, and insulin though does increase his risk for complication from COVID.  Mixed dyslipidemia On pravastatin 80 milligrams daily.  He will hold this while on Paxlovid.     Subjective:  HPI:  See A/P for status of chronic conditions.  Patient is here today with COVID.  Has been having cough and fatigue.  Home test was positive 3 days ago.  Wife currently in the hospital with COVID.        Objective/Observations  Physical Exam: Gen: NAD, resting comfortably Pulm: Normal work of breathing Neuro: Grossly normal, moves all extremities Psych: Normal affect and thought content  Virtual Visit via Video   I connected with [name] on 10/28/22 at  2:20 PM EDT by a video enabled telemedicine application and verified that I am speaking with the correct person using two identifiers. The limitations of evaluation and management by telemedicine and the availability of in person appointments were discussed. The patient expressed understanding and agreed to proceed.   Patient location: Home Provider location: [place] [facility] [employer] Persons participating in the virtual visit: Myself and Patient     Katina Degree. Jimmey Ralph, MD 10/28/2022 2:31 PM

## 2022-11-03 ENCOUNTER — Ambulatory Visit: Payer: Medicare Other | Admitting: Family Medicine

## 2022-11-07 ENCOUNTER — Encounter (INDEPENDENT_AMBULATORY_CARE_PROVIDER_SITE_OTHER): Payer: Medicare Other | Admitting: Ophthalmology

## 2022-11-15 ENCOUNTER — Encounter (INDEPENDENT_AMBULATORY_CARE_PROVIDER_SITE_OTHER): Payer: Medicare Other | Admitting: Ophthalmology

## 2022-11-15 DIAGNOSIS — I10 Essential (primary) hypertension: Secondary | ICD-10-CM

## 2022-11-15 DIAGNOSIS — H353132 Nonexudative age-related macular degeneration, bilateral, intermediate dry stage: Secondary | ICD-10-CM

## 2022-11-15 DIAGNOSIS — E113293 Type 2 diabetes mellitus with mild nonproliferative diabetic retinopathy without macular edema, bilateral: Secondary | ICD-10-CM

## 2022-11-15 DIAGNOSIS — Z7984 Long term (current) use of oral hypoglycemic drugs: Secondary | ICD-10-CM

## 2022-11-15 DIAGNOSIS — H35033 Hypertensive retinopathy, bilateral: Secondary | ICD-10-CM

## 2022-11-15 DIAGNOSIS — H43813 Vitreous degeneration, bilateral: Secondary | ICD-10-CM

## 2022-11-25 ENCOUNTER — Other Ambulatory Visit: Payer: Self-pay | Admitting: *Deleted

## 2022-11-25 DIAGNOSIS — M7989 Other specified soft tissue disorders: Secondary | ICD-10-CM

## 2022-11-25 DIAGNOSIS — I872 Venous insufficiency (chronic) (peripheral): Secondary | ICD-10-CM

## 2022-12-02 ENCOUNTER — Ambulatory Visit: Payer: Medicare Other | Admitting: Physician Assistant

## 2022-12-02 ENCOUNTER — Ambulatory Visit (HOSPITAL_COMMUNITY)
Admission: RE | Admit: 2022-12-02 | Discharge: 2022-12-02 | Disposition: A | Discharge status: Home / Self Care | Payer: Medicare Other | Source: Ambulatory Visit | Attending: Vascular Surgery

## 2022-12-02 VITALS — BP 126/76 | HR 78 | Temp 98.3°F | Resp 18 | Ht 70.0 in | Wt 168.9 lb

## 2022-12-02 DIAGNOSIS — M7989 Other specified soft tissue disorders: Secondary | ICD-10-CM

## 2022-12-02 DIAGNOSIS — I872 Venous insufficiency (chronic) (peripheral): Secondary | ICD-10-CM | POA: Diagnosis not present

## 2022-12-02 DIAGNOSIS — I890 Lymphedema, not elsewhere classified: Secondary | ICD-10-CM | POA: Diagnosis not present

## 2022-12-02 NOTE — Progress Notes (Signed)
[facility] [place]     History of Present Illness  Scott Herman is a 89 y.o. male who presents with chief complaint: darkening skin changes.  He has a history of hitting his leg on a folding chair a while back and it took a long time for the wound to heal. He subsequently noticed some darkening of the skin around the area his PCP sent him to our office back in March of 2023.  He had evidence of mild reflux without enlarged veins.  The plan was for elevation , knee high mild compression and activity as tolerates.   He states he is a diabetic and just wants to make sure he is caring for his circulation.  No new symptoms.  He denies non healing wounds, skin changes, weeping, claudication, or rest pain.  He can walk as far as he likes.    Venous symptoms include: darkening of skin of leg Onset/duration:  > 18 months  Occupation:  retired Aggravating factors: none, right leg stays swollen Alleviating factors: elevation, compression Compression:  knee high        Helps:  yes Pain medications:  none Previous vein procedures:  none History of DVT:  no   Past Medical History:  Diagnosis Date   Abnormal stress electrocardiogram test Jan 2013   Negative cardiac catheterization.    Diabetes mellitus, type 2 (HCC)    Dr Altheimer   Diverticulosis    GERD (gastroesophageal reflux disease)    Hiatal hernia    Hyperlipidemia    Hypertension    Normal cardiac stress test Jan 2009   Normal ETT   Palpitations    Negative holter in 2009 except for tachycardia with bicycling    Past Surgical History:  Procedure Laterality Date   CARDIAC CATHETERIZATION  Feb 2013   Mild nonobstructive CAD. Normal LV function   CHOLECYSTECTOMY     COLONOSCOPY     Dr Russella Dar   COLONOSCOPY WITH PROPOFOL N/A 12/22/2016   Procedure: COLONOSCOPY WITH PROPOFOL;  Surgeon: Iva Boop, MD;  Location: [facility];  Service: Endoscopy;  Laterality: N/A;   HEMORRHOID SURGERY     INGUINAL  HERNIA REPAIR     ORIF CLAVICULAR FRACTURE     left   TONSILLECTOMY      Social History   Socioeconomic History   Marital status: Married    Spouse name: Not on file   Number of children: Not on file   Years of education: Not on file   Highest education level: Not on file  Occupational History   Occupation: Retired  Tobacco Use   Smoking status: Never   Smokeless tobacco: Never  Vaping Use   Vaping status: Never Used  Substance and Sexual Activity   Alcohol use: No    Alcohol/week: 0.0 standard drinks of alcohol   Drug use: No   Sexual activity: Yes  Other Topics Concern   Not on file  Social History Narrative   Not on file   Social Determinants of Health   Financial Resource Strain: Low Risk  (01/24/2022)   Overall Financial Resource Strain (CARDIA)    Difficulty of Paying Living Expenses: Not hard at all  Food Insecurity: No Food Insecurity (01/24/2022)   Hunger Vital Sign    Worried About Running Out of Food in the Last Year: Never true    Ran Out of Food in the Last Year: Never true  Transportation Needs: No Transportation Needs (01/24/2022)   PRAPARE -  Administrator, Civil Service (Medical): No    Lack of Transportation (Non-Medical): No  Physical Activity: Sufficiently Active (01/24/2022)   Exercise Vital Sign    Days of Exercise per Week: 5 days    Minutes of Exercise per Session: 30 min  Stress: No Stress Concern Present (01/24/2022)   [employer] of Occupational Health - Occupational Stress Questionnaire    Feeling of Stress : Not at all  Social Connections: Moderately Integrated (01/24/2022)   Social Connection and Isolation Panel [NHANES]    Frequency of Communication with Friends and Family: More than three times a week    Frequency of Social Gatherings with Friends and Family: More than three times a week    Attends Religious Services: More than 4 times per year    Active Member of [employer] or Organizations: No    Attends Tax inspector Meetings: Never    Marital Status: Married  Catering manager Violence: Not At Risk (01/24/2022)   Humiliation, Afraid, Rape, and Kick questionnaire    Fear of Current or Ex-Partner: No    Emotionally Abused: No    Physically Abused: No    Sexually Abused: No    Family History  Problem Relation Age of Onset   Prostate cancer Father    Diabetes Father    Diverticulosis Father    Hypertension Brother    Diabetes Brother    Prostate cancer Paternal Grandfather    Diabetes Paternal Grandmother    Colon cancer Neg Hx     Current Outpatient Medications on File Prior to Visit  Medication Sig Dispense Refill   acetaminophen (TYLENOL) 650 MG CR tablet Take 1,300 mg by mouth every 8 (eight) hours as needed for pain.     aspirin 81 MG tablet Take 1 tablet (81 mg total) by mouth at bedtime. 30 tablet    B-D UF III MINI PEN NEEDLES 31G X 5 MM MISC SMARTSIG:1 Needle SUB-Q Daily     Blood Glucose Calibration (OT ULTRA/FASTTK CNTRL SOLN) SOLN Use to test blood sugars (Dx: E11.9)     ciclopirox (PENLAC) 8 % solution Apply topically at bedtime. Apply over nail and surrounding skin. Apply daily over previous coat. Remove weekly with file or polish remover. 6.6 mL 0   CINNAMON PO Take 2,000 mg by mouth at bedtime.     co-enzyme Q-10 30 MG capsule Take 30 mg by mouth daily.     Continuous Blood Gluc Receiver (FREESTYLE LIBRE 2 READER) DEVI SCAN AS NEEDED FOR CONTINUOUS GLUCOSE MONITORING     doxazosin (CARDURA) 4 MG tablet Take 2 tablets by mouth once daily 180 tablet 0   esomeprazole (NEXIUM) 40 MG capsule Take 1 capsule (40 mg total) by mouth daily before breakfast. 90 capsule 1   glucose blood (ONETOUCH VERIO) test strip Use to test blood sugars 4 times a day or as directed (Dx: E11.65)     Insulin Glargine (TOUJEO SOLOSTAR [place]) Inject 14 Units into the skin every morning.     metFORMIN (GLUCOPHAGE-XR) 750 MG 24 hr tablet Take 750 mg by mouth 2 (two) times daily.     Multiple  Vitamins-Minerals (MENS 50+ MULTI VITAMIN/MIN) TABS Take 1 tablet by mouth daily.     pioglitazone (ACTOS) 45 MG tablet Take 45 mg by mouth daily.     pravastatin (PRAVACHOL) 80 MG tablet Take 80 mg by mouth at bedtime.     No current facility-administered medications on file prior to visit.    Allergies  as of 12/02/2022   (No Known Allergies)     ROS:   General:  No weight loss, Fever, chills  HEENT: No recent headaches, no nasal bleeding, no visual changes, no sore throat  Neurologic: No dizziness, blackouts, seizures. No recent symptoms of stroke or mini- stroke. No recent episodes of slurred speech, or temporary blindness.  Cardiac: No recent episodes of chest pain/pressure, no shortness of breath at rest.  No shortness of breath with exertion.  Denies history of atrial fibrillation or irregular heartbeat  Vascular: No history of rest pain in feet.  No history of claudication.  No history of non-healing ulcer, No history of DVT   Pulmonary: No home oxygen, no productive cough, no hemoptysis,  No asthma or wheezing  Musculoskeletal:  [ x] Arthritis, [ ]  Low back pain,  [ ]  Joint pain  Hematologic:No history of hypercoagulable state.  No history of easy bleeding.  No history of anemia  Gastrointestinal: No hematochezia or melena,  No gastroesophageal reflux, no trouble swallowing  Urinary: [ ]  chronic Kidney disease, [ ]  on HD - [ ]  MWF or [ ]  TTHS, [ ]  Burning with urination, [ ]  Frequent urination, [ ]  Difficulty urinating;   Skin: No rashes  Psychological: No history of anxiety,  No history of depression  Physical Examination  Vitals:   12/02/22 1237  BP: 126/76  Pulse: 78  Resp: 18  Temp: 98.3 F (36.8 C)  TempSrc: Temporal  SpO2: 95%  Weight: 168 lb 14.4 oz (76.6 kg)  Height: 5\' 10"  (1.778 m)    Body mass index is 24.23 kg/m.  General:  Alert and oriented, no acute distress HEENT: Normal Neck: No bruit or JVD Pulmonary: Clear to auscultation  bilaterally Cardiac: Regular Rate and Rhythm without murmur Abdomen: Soft, non-tender, non-distended, no mass, no scars Skin: No rash  Extremity Pulses:  radial, femoral, dorsalis pedis, pulses bilaterally, no ischemic skin changes Musculoskeletal: right >left pitting edema  Neurologic: Upper and lower extremity motor 5/5 and symmetric  DATA:    +--------------+---------+------+-----------+------------+--------+  LEFT         Reflux NoRefluxReflux TimeDiameter cmsComments                          Yes                                   +--------------+---------+------+-----------+------------+--------+  CFV                    yes   >1 second                       +--------------+---------+------+-----------+------------+--------+  FV mid                  yes   >1 second                       +--------------+---------+------+-----------+------------+--------+  Popliteal    no                                              +--------------+---------+------+-----------+------------+--------+  GSV at SFJ              yes    >500 ms  0.58              +--------------+---------+------+-----------+------------+--------+  GSV prox thighno                            0.50              +--------------+---------+------+-----------+------------+--------+  GSV mid thigh           yes    >500 ms      0.23              +--------------+---------+------+-----------+------------+--------+  GSV dist thigh          yes    >500 ms      0.23              +--------------+---------+------+-----------+------------+--------+  GSV at knee             yes    >500 ms      036               +--------------+---------+------+-----------+------------+--------+  GSV prox calf           yes    >500 ms      0.39              +--------------+---------+------+-----------+------------+--------+  GSV mid calf            yes    >500 ms       0.31              +--------------+---------+------+-----------+------------+--------+  SSV Pop Fossa no                            0.34              +--------------+---------+------+-----------+------------+--------+  SSV prox calf no                            0.23              +--------------+---------+------+-----------+------------+--------+  SSV mid calf  no                            0.23              +--------------+---------+------+-----------+------------+--------+  AASV O        no                                              +--------------+---------+------+-----------+------------+--------+        Summary:  Left:  - No evidence of deep vein thrombosis seen in the left lower extremity,  from the common femoral through the popliteal veins.  - No evidence of superficial venous reflux seen in the left short  saphenous vein.  - Venous reflux is noted in the left common femoral vein.  - Venous reflux is noted in the left sapheno-femoral junction.  - Venous reflux is noted in the left greater saphenous vein in the thigh.  - Venous reflux is noted in the left greater saphenous vein in the calf.  - Venous reflux is noted in the left femoral vein.    Assessment/Plan: Venous reflux mild mixed lymphedema on the right Based on his studies there is no change in the vein  size < 0.4cm through out the GSV below the SFJ.  He states he has not been wearing his compression on a regular basis.  He has not been able to exercise riding his bike as much as he used to.  He does have a stationary bike as well.  He continues to acre for his wife.    He has palpable pedal pulses and is not at risk of limb loss.  His skin looks healthy without ischemic or significant venous changes.  We reviewed proper elevation, wear compression daily, and exercise as tolerates and time will permit.   F/U PRN.        Mosetta Pigeon PA-C [facility]  [place] Office: [phone]  MD in clinic [place]

## 2022-12-22 ENCOUNTER — Other Ambulatory Visit: Payer: Self-pay | Admitting: Family Medicine

## 2022-12-22 MED ORDER — DOXAZOSIN MESYLATE 4 MG PO TABS: ORAL_TABLET | ORAL | 3 refills | Status: DC

## 2023-01-23 DIAGNOSIS — K080 Exfoliation of teeth due to systemic causes: Secondary | ICD-10-CM | POA: Diagnosis not present

## 2023-01-31 ENCOUNTER — Ambulatory Visit (INDEPENDENT_AMBULATORY_CARE_PROVIDER_SITE_OTHER): Payer: Medicare Other

## 2023-01-31 VITALS — Wt 168.0 lb

## 2023-01-31 DIAGNOSIS — Z0000 Encounter for general adult medical examination without abnormal findings: Secondary | ICD-10-CM

## 2023-01-31 NOTE — Progress Notes (Signed)
Subjective:   Scott Herman is a 89 y.o. male who presents for Medicare Annual/Subsequent preventive examination.  Visit Complete: Virtual I connected with  Scott Herman on 01/31/23 by a audio enabled telemedicine application and verified that I am speaking with the correct person using two identifiers.  Patient Location: Home  Provider Location: Office/Clinic  I discussed the limitations of evaluation and management by telemedicine. The patient expressed understanding and agreed to proceed.  Vital Signs: Because this visit was a virtual/telehealth visit, some criteria may be missing or patient reported. Any vitals not documented were not able to be obtained and vitals that have been documented are patient reported.   Cardiac Risk Factors include: advanced age (>30men, >69 women);dyslipidemia;diabetes mellitus;hypertension;male gender     Objective:    Today's Vitals   01/31/23 0943  Weight: 168 lb (76.2 kg)   Body mass index is 24.11 kg/m.     01/31/2023    9:53 AM 11/28/2021   10:43 PM 09/07/2021    3:01 PM 01/15/2021    8:18 AM 01/05/2021   12:29 PM 12/10/2020    3:12 PM 01/30/2020    5:12 PM  Advanced Directives  Does Patient Have a Medical Advance Directive? Yes No Yes Yes Yes No No  Type of Estate agent of [place];Living will  Living will Healthcare Power of Attorney     Copy of Healthcare Power of Attorney in Chart? No - copy requested   No - copy requested     Would patient like information on creating a medical advance directive?  No - Patient declined     No - Patient declined    Current Medications (verified) Outpatient Encounter Medications as of 01/31/2023  Medication Sig   acetaminophen (TYLENOL) 650 MG CR tablet Take 1,300 mg by mouth every 8 (eight) hours as needed for pain.   aspirin 81 MG tablet Take 1 tablet (81 mg total) by mouth at bedtime.   B-D UF III MINI PEN NEEDLES 31G X 5 MM MISC SMARTSIG:1 Needle SUB-Q Daily    Blood Glucose Calibration (OT ULTRA/FASTTK CNTRL SOLN) SOLN Use to test blood sugars (Dx: E11.9)   CINNAMON PO Take 2,000 mg by mouth at bedtime.   co-enzyme Q-10 30 MG capsule Take 30 mg by mouth daily.   Continuous Glucose Sensor (DEXCOM G7 SENSOR) MISC CHANGE SENSOR EVERY 10 DAY   doxazosin (CARDURA) 4 MG tablet Take 2 tablets by mouth once daily   esomeprazole (NEXIUM) 40 MG capsule Take 1 capsule (40 mg total) by mouth daily before breakfast.   glucose blood (ONETOUCH VERIO) test strip Use to test blood sugars 4 times a day or as directed (Dx: E11.65)   Insulin Glargine (TOUJEO SOLOSTAR [place]) Inject 14 Units into the skin every morning.   metFORMIN (GLUCOPHAGE-XR) 750 MG 24 hr tablet Take 750 mg by mouth 2 (two) times daily.   Multiple Vitamins-Minerals (MENS 50+ MULTI VITAMIN/MIN) TABS Take 1 tablet by mouth daily.   pioglitazone (ACTOS) 45 MG tablet Take 45 mg by mouth daily.   pravastatin (PRAVACHOL) 80 MG tablet Take 80 mg by mouth at bedtime.   ciclopirox (PENLAC) 8 % solution Apply topically at bedtime. Apply over nail and surrounding skin. Apply daily over previous coat. Remove weekly with file or polish remover. (Patient not taking: Reported on 01/31/2023)   [DISCONTINUED] Continuous Blood Gluc Receiver (FREESTYLE LIBRE 2 READER) DEVI SCAN AS NEEDED FOR CONTINUOUS GLUCOSE MONITORING   No facility-administered encounter medications on file  as of 01/31/2023.    Allergies (verified) Patient has no known allergies.   History: Past Medical History:  Diagnosis Date   Abnormal stress electrocardiogram test Jan 2013   Negative cardiac catheterization.    Diabetes mellitus, type 2 (HCC)    Dr Altheimer   Diverticulosis    GERD (gastroesophageal reflux disease)    Hiatal hernia    Hyperlipidemia    Hypertension    Normal cardiac stress test Jan 2009   Normal ETT   Palpitations    Negative holter in 2009 except for tachycardia with bicycling   Past Surgical History:  Procedure  Laterality Date   CARDIAC CATHETERIZATION  Feb 2013   Mild nonobstructive CAD. Normal LV function   CHOLECYSTECTOMY     COLONOSCOPY     Dr Russella Dar   COLONOSCOPY WITH PROPOFOL N/A 12/22/2016   Procedure: COLONOSCOPY WITH PROPOFOL;  Surgeon: Iva Boop, MD;  Location: [facility];  Service: Endoscopy;  Laterality: N/A;   HEMORRHOID SURGERY     INGUINAL HERNIA REPAIR     ORIF CLAVICULAR FRACTURE     left   TONSILLECTOMY     Family History  Problem Relation Age of Onset   Prostate cancer Father    Diabetes Father    Diverticulosis Father    Hypertension Brother    Diabetes Brother    Prostate cancer Paternal Grandfather    Diabetes Paternal Grandmother    Colon cancer Neg Hx    Social History   Socioeconomic History   Marital status: Married    Spouse name: Not on file   Number of children: Not on file   Years of education: Not on file   Highest education level: Not on file  Occupational History   Occupation: Retired  Tobacco Use   Smoking status: Never   Smokeless tobacco: Never  Vaping Use   Vaping status: Never Used  Substance and Sexual Activity   Alcohol use: No    Alcohol/week: 0.0 standard drinks of alcohol   Drug use: No   Sexual activity: Yes  Other Topics Concern   Not on file  Social History Narrative   Not on file   Social Determinants of Health   Financial Resource Strain: Low Risk  (01/31/2023)   Overall Financial Resource Strain (CARDIA)    Difficulty of Paying Living Expenses: Not hard at all  Food Insecurity: No Food Insecurity (01/31/2023)   Hunger Vital Sign    Worried About Running Out of Food in the Last Year: Never true    Ran Out of Food in the Last Year: Never true  Transportation Needs: No Transportation Needs (01/31/2023)   PRAPARE - Administrator, Civil Service (Medical): No    Lack of Transportation (Non-Medical): No  Physical Activity: Sufficiently Active (01/31/2023)   Exercise Vital Sign    Days of Exercise per  Week: 5 days    Minutes of Exercise per Session: 30 min  Stress: No Stress Concern Present (01/31/2023)   [employer] of Occupational Health - Occupational Stress Questionnaire    Feeling of Stress : Not at all  Social Connections: Moderately Integrated (01/31/2023)   Social Connection and Isolation Panel [NHANES]    Frequency of Communication with Friends and Family: More than three times a week    Frequency of Social Gatherings with Friends and Family: More than three times a week    Attends Religious Services: More than 4 times per year    Active Member of [employer]  or Organizations: No    Attends Banker Meetings: Never    Marital Status: Married    Tobacco Counseling Counseling given: Not Answered   Clinical Intake:  Pre-visit preparation completed: Yes  Pain : No/denies pain     BMI - recorded: 24.11 Nutritional Status: BMI of 19-24  Normal Nutritional Risks: None Diabetes: Yes CBG done?: Yes (127 per pt dexa com 7) CBG resulted in Enter/ Edit results?: No Did pt. bring in CBG monitor from home?: No  How often do you need to have someone help you when you read instructions, pamphlets, or other written materials from your doctor or pharmacy?: 1 - Never  Interpreter Needed?: No  Information entered by :: Lanier Ensign, LPN   Activities of Daily Living    01/31/2023    9:48 AM  In your present state of health, do you have any difficulty performing the following activities:  Hearing? 0  Vision? 0  Difficulty concentrating or making decisions? 0  Walking or climbing stairs? 0  Dressing or bathing? 0  Doing errands, shopping? 0  Preparing Food and eating ? N  Using the Toilet? N  In the past six months, have you accidently leaked urine? N  Do you have problems with loss of bowel control? N  Managing your Medications? N  Managing your Finances? N  Housekeeping or managing your Housekeeping? N    Patient Care Team: Ardith Dark, MD as PCP -  General (Family Medicine) Jerilee Field, MD as Consulting Physician (Urology) Rollene Rotunda, MD as Consulting Physician (Cardiology) Meryl Dare, MD as Consulting Physician (Gastroenterology) Altheimer, Casimiro Needle, MD as Consulting Physician (Endocrinology) Sherrie George, MD as Consulting Physician (Ophthalmology) Gaylord Shih, [place] (Specialist) Janalyn Harder, MD (Inactive) as Consulting Physician (Dermatology)  Indicate any recent Medical Services you may have received from other than [facility] providers in the past year (date may be approximate).     Assessment:   This is a routine wellness examination for Dayren.  Hearing/Vision screen Hearing Screening - Comments:: Pt denies any hearing issues  Vision Screening - Comments:: Pt follows up with Dr Genia Del for annual eye exams    Goals Addressed             This Visit's Progress    Patient Stated       Maintain health and activity        Depression Screen    01/31/2023    9:53 AM 10/28/2022    2:09 PM 01/24/2022   11:19 AM 12/03/2021    9:55 AM 01/15/2021    8:17 AM 10/06/2020    2:08 PM 11/28/2019    1:33 PM  PHQ 2/9 Scores  PHQ - 2 Score 0 0 0 0 0 0 0    Fall Risk    01/31/2023    9:58 AM 10/28/2022    2:09 PM 01/24/2022   11:21 AM 12/03/2021    9:55 AM 09/07/2021    3:00 PM  Fall Risk   Falls in the past year? 0 0 0 0 0  Number falls in past yr: 0 0 0 0 0  Injury with Fall? 0 0 0 0 0  Risk for fall due to : No Fall Risks No Fall Risks Impaired vision No Fall Risks   Follow up Falls prevention discussed  Falls prevention discussed      MEDICARE RISK AT HOME: Medicare Risk at Home Any stairs in or around the home?: No If so, are there any without  handrails?: No Home free of loose throw rugs in walkways, pet beds, electrical cords, etc?: Yes Adequate lighting in your home to reduce risk of falls?: Yes Life alert?: No Use of a cane, walker or w/c?: No Grab bars in the bathroom?: Yes Shower chair or bench  in shower?: Yes Elevated toilet seat or a handicapped toilet?: No  TIMED UP AND GO:  Was the test performed?  No    Cognitive Function:        01/31/2023   10:00 AM 01/24/2022   11:23 AM 01/15/2021    8:22 AM 11/28/2019    1:29 PM 11/09/2018    1:41 PM  6CIT Screen  What Year? 0 points 0 points 0 points 0 points 0 points  What month? 0 points 0 points 0 points 0 points 0 points  What time? 0 points 0 points 0 points  0 points  Count back from 20 0 points 0 points 0 points 0 points 0 points  Months in reverse 0 points 0 points 0 points 0 points 0 points  Repeat phrase 0 points 0 points 0 points 0 points 0 points  Total Score 0 points 0 points 0 points  0 points    Immunizations Immunization History  Administered Date(s) Administered   Fluad Quad(high Dose 65+) 11/09/2018, 12/04/2020, 11/12/2021   Hepatitis A 06/18/1998, 06/08/1999   IPV 06/08/1999   Influenza, High Dose Seasonal PF 01/13/2014, 12/08/2017, 11/09/2018, 11/15/2019, 12/13/2022   Influenza-Unspecified 12/08/2014, 01/04/2016, 11/28/2016, 11/30/2020   PFIZER Comirnaty(Gray Top)Covid-19 Tri-Sucrose Vaccine 11/29/2019   PFIZER(Purple Top)SARS-COV-2 Vaccination 03/20/2019, 04/10/2019, 11/29/2019, 12/16/2019   Pneumococcal Conjugate-13 02/18/2016   Typhoid Inactivated 06/18/1998   Zoster Recombinant(Shingrix) 01/24/2006   Zoster, Live 01/24/2006, 02/26/2010    TDAP status: Due, Education has been provided regarding the importance of this vaccine. Advised may receive this vaccine at local pharmacy or Health Dept. Aware to provide a copy of the vaccination record if obtained from local pharmacy or Health Dept. Verbalized acceptance and understanding.  Flu Vaccine status: Up to date  Pneumococcal vaccine status: Due, Education has been provided regarding the importance of this vaccine. Advised may receive this vaccine at local pharmacy or Health Dept. Aware to provide a copy of the vaccination record if obtained from  local pharmacy or Health Dept. Verbalized acceptance and understanding.  Covid-19 vaccine status: Information provided on how to obtain vaccines.   Qualifies for Shingles Vaccine? Yes   Zostavax completed No   Shingrix Completed?: No.    Education has been provided regarding the importance of this vaccine. Patient has been advised to call insurance company to determine out of pocket expense if they have not yet received this vaccine. Advised may also receive vaccine at local pharmacy or Health Dept. Verbalized acceptance and understanding.  Screening Tests Health Maintenance  Topic Date Due   DTaP/Tdap/Td (1 - Tdap) Never done   Zoster Vaccines- Shingrix (2 of 2) 04/23/2010   HEMOGLOBIN A1C  05/13/2019   COVID-19 Vaccine (6 - 2023-24 season) 10/30/2022   Pneumonia Vaccine 38+ Years old (2 of 2 - PPSV23 or PCV20) 10/28/2023 (Originally 02/17/2017)   FOOT EXAM  03/03/2023   OPHTHALMOLOGY EXAM  06/27/2023   Medicare Annual Wellness (AWV)  01/31/2024   INFLUENZA VACCINE  Completed   HPV VACCINES  Aged Out    Health Maintenance  Health Maintenance Due  Topic Date Due   DTaP/Tdap/Td (1 - Tdap) Never done   Zoster Vaccines- Shingrix (2 of 2) 04/23/2010   HEMOGLOBIN A1C  05/13/2019   COVID-19 Vaccine (6 - 2023-24 season) 10/30/2022    Colorectal cancer screening: No longer required.    Additional Screening:   Vision Screening: Recommended annual ophthalmology exams for early detection of glaucoma and other disorders of the eye. Is the patient up to date with their annual eye exam?  Yes  Who is the provider or what is the name of the office in which the patient attends annual eye exams? Dr Genia Del  If pt is not established with a provider, would they like to be referred to a provider to establish care? No .   Dental Screening: Recommended annual dental exams for proper oral hygiene  Community Resource Referral / Chronic Care Management: CRR required this visit?  No   CCM  required this visit?  No     Plan:     I have personally reviewed and noted the following in the patient's chart:   Medical and social history Use of alcohol, tobacco or illicit drugs  Current medications and supplements including opioid prescriptions. Patient is not currently taking opioid prescriptions. Functional ability and status Nutritional status Physical activity Advanced directives List of other physicians Hospitalizations, surgeries, and ER visits in previous 12 months Vitals Screenings to include cognitive, depression, and falls Referrals and appointments  In addition, I have reviewed and discussed with patient certain preventive protocols, quality metrics, and best practice recommendations. A written personalized care plan for preventive services as well as general preventive health recommendations were provided to patient.     Marzella Schlein, LPN   [id number]   After Visit Summary: (MyChart) Due to this being a telephonic visit, the after visit summary with patients personalized plan was offered to patient via MyChart   Nurse Notes: none

## 2023-01-31 NOTE — Patient Instructions (Signed)
Scott Herman , Thank you for taking time to come for your Medicare Wellness Visit. I appreciate your ongoing commitment to your health goals. Please review the following plan we discussed and let me know if I can assist you in the future.   Referrals/Orders/Follow-Ups/Clinician Recommendations: maintain health and activity  Aim for 30 minutes of exercise or brisk walking, 6-8 glasses of water, and 5 servings of fruits and vegetables each day.   This is a list of the screening recommended for you and due dates:  Health Maintenance  Topic Date Due   DTaP/Tdap/Td vaccine (1 - Tdap) Never done   Zoster (Shingles) Vaccine (2 of 2) 04/23/2010   Hemoglobin A1C  05/13/2019   COVID-19 Vaccine (6 - 2023-24 season) 10/30/2022   Pneumonia Vaccine (2 of 2 - PPSV23 or PCV20) 10/28/2023*   Complete foot exam   03/03/2023   Eye exam for diabetics  06/27/2023   Medicare Annual Wellness Visit  01/31/2024   Flu Shot  Completed   HPV Vaccine  Aged Out  *Topic was postponed. The date shown is not the original due date.    Advanced directives: (Copy Requested) Please bring a copy of your health care power of attorney and living will to the office to be added to your chart at your convenience.  Next Medicare Annual Wellness Visit scheduled for next year: Yes

## 2023-02-23 DIAGNOSIS — I129 Hypertensive chronic kidney disease with stage 1 through stage 4 chronic kidney disease, or unspecified chronic kidney disease: Secondary | ICD-10-CM | POA: Diagnosis not present

## 2023-02-23 DIAGNOSIS — R809 Proteinuria, unspecified: Secondary | ICD-10-CM | POA: Diagnosis not present

## 2023-02-23 DIAGNOSIS — E785 Hyperlipidemia, unspecified: Secondary | ICD-10-CM | POA: Diagnosis not present

## 2023-02-23 DIAGNOSIS — E113293 Type 2 diabetes mellitus with mild nonproliferative diabetic retinopathy without macular edema, bilateral: Secondary | ICD-10-CM | POA: Diagnosis not present

## 2023-02-23 DIAGNOSIS — N182 Chronic kidney disease, stage 2 (mild): Secondary | ICD-10-CM | POA: Diagnosis not present

## 2023-02-23 DIAGNOSIS — Z794 Long term (current) use of insulin: Secondary | ICD-10-CM | POA: Diagnosis not present

## 2023-02-23 DIAGNOSIS — E1169 Type 2 diabetes mellitus with other specified complication: Secondary | ICD-10-CM | POA: Diagnosis not present

## 2023-02-23 DIAGNOSIS — E1129 Type 2 diabetes mellitus with other diabetic kidney complication: Secondary | ICD-10-CM | POA: Diagnosis not present

## 2023-02-23 DIAGNOSIS — E1142 Type 2 diabetes mellitus with diabetic polyneuropathy: Secondary | ICD-10-CM | POA: Diagnosis not present

## 2023-02-23 DIAGNOSIS — E1165 Type 2 diabetes mellitus with hyperglycemia: Secondary | ICD-10-CM | POA: Diagnosis not present

## 2023-02-23 DIAGNOSIS — E1122 Type 2 diabetes mellitus with diabetic chronic kidney disease: Secondary | ICD-10-CM | POA: Diagnosis not present

## 2023-02-24 DIAGNOSIS — Z794 Long term (current) use of insulin: Secondary | ICD-10-CM | POA: Diagnosis not present

## 2023-02-24 DIAGNOSIS — E1165 Type 2 diabetes mellitus with hyperglycemia: Secondary | ICD-10-CM | POA: Diagnosis not present

## 2023-02-24 DIAGNOSIS — E1122 Type 2 diabetes mellitus with diabetic chronic kidney disease: Secondary | ICD-10-CM | POA: Diagnosis not present

## 2023-02-24 DIAGNOSIS — E113293 Type 2 diabetes mellitus with mild nonproliferative diabetic retinopathy without macular edema, bilateral: Secondary | ICD-10-CM | POA: Diagnosis not present

## 2023-03-09 ENCOUNTER — Ambulatory Visit (INDEPENDENT_AMBULATORY_CARE_PROVIDER_SITE_OTHER): Payer: Medicare Other | Admitting: Family Medicine

## 2023-03-09 ENCOUNTER — Encounter: Payer: Self-pay | Admitting: Family Medicine

## 2023-03-09 VITALS — BP 129/75 | HR 87 | Temp 98.6°F | Ht 70.0 in | Wt 169.6 lb

## 2023-03-09 DIAGNOSIS — N401 Enlarged prostate with lower urinary tract symptoms: Secondary | ICD-10-CM

## 2023-03-09 DIAGNOSIS — E1159 Type 2 diabetes mellitus with other circulatory complications: Secondary | ICD-10-CM | POA: Diagnosis not present

## 2023-03-09 DIAGNOSIS — I152 Hypertension secondary to endocrine disorders: Secondary | ICD-10-CM | POA: Diagnosis not present

## 2023-03-09 DIAGNOSIS — E119 Type 2 diabetes mellitus without complications: Secondary | ICD-10-CM

## 2023-03-09 DIAGNOSIS — R351 Nocturia: Secondary | ICD-10-CM

## 2023-03-09 LAB — URINALYSIS, ROUTINE W REFLEX MICROSCOPIC
Bilirubin Urine: NEGATIVE
Hgb urine dipstick: NEGATIVE
Ketones, ur: NEGATIVE
Leukocytes,Ua: NEGATIVE
Nitrite: NEGATIVE
Specific Gravity, Urine: 1.015 (ref 1.000–1.030)
Total Protein, Urine: NEGATIVE
Urine Glucose: NEGATIVE
Urobilinogen, UA: 1 (ref 0.0–1.0)
pH: 7 (ref 5.0–8.0)

## 2023-03-09 LAB — PSA: PSA: 1.83 ng/mL (ref 0.10–4.00)

## 2023-03-09 NOTE — Patient Instructions (Signed)
 It was very nice to see you today!  We will check blood work and a urine sample today.  We may need to have you follow back up with urology depending on your results.   Take care, Dr Kennyth  PLEASE NOTE:  If you had any lab tests, please let us  know if you have not heard back within a few days. You may see your results on mychart before we have a chance to review them but we will give you a call once they are reviewed by us .   If we ordered any referrals today, please let us  know if you have not heard from their office within the next week.   If you had any urgent prescriptions sent in today, please check with the pharmacy within an hour of our visit to make sure the prescription was transmitted appropriately.   Please try these tips to maintain a healthy lifestyle:  Eat at least 3 REAL meals and 1-2 snacks per day.  Aim for no more than 5 hours between eating.  If you eat breakfast, please do so within one hour of getting up.   Each meal should contain half fruits/vegetables, one quarter protein, and one quarter carbs (no bigger than a computer mouse)  Cut down on sweet beverages. This includes juice, soda, and sweet tea.   Drink at least 1 glass of water with each meal and aim for at least 8 glasses per day  Exercise at least 150 minutes every week.

## 2023-03-09 NOTE — Progress Notes (Signed)
   Scott Herman is a 89 y.o. male who presents today for an office visit.  Assessment/Plan:  New/Acute Problems: Nocturia Likely secondary to BPH.  We will check UA and urine culture.  Also check PSA per patient request.  Depending on results we will likely need to have him follow back up with urology.  Chronic Problems Addressed Today: BPH (benign prostatic hyperplasia) Likely the source of his worsening nocturia.  He is on Cardura  8 mg daily.  We are checking labs as above.  Will likely need to be referred back to urology pending on results of labs today.  Hypertension associated with diabetes (HCC) Blood pressure at goal today without meds.  Diabetes (HCC) Last A1c 6.7.  Continue management per endocrinology.  Do not think this is contributing significantly to his nocturia at this point.     Subjective:  HPI:  See Assessment / plan for status of chronic conditions.  Patient is here today with primary concern for nocturia.  He has a known history of BPH and has had ongoing issues with nocturia for quite a while however symptoms soon to be worsening over the last several months.  He is sometimes waking up 4-6 times per night to urinate.  No dysuria.  No hematuria.  He has seen urology for this in the past but is not seen them for quite a while.  He is currently on Cardura  8 mg daily.  He is consistent with this but is not sure if it is helping.  He has been following with endocrinology for his diabetes.  Sugars have been well-controlled.  Last A1c was 6.7.       Objective:  Physical Exam: BP 129/75   Pulse 87   Temp 98.6 F (37 C) (Temporal)   Ht 5' 10 (1.778 m)   Wt 169 lb 9.6 oz (76.9 kg)   SpO2 96%   BMI 24.34 kg/m   Gen: No acute distress, resting comfortably Neuro: Grossly normal, moves all extremities Psych: Normal affect and thought content      Illyana Schorsch M. Kennyth, MD 03/09/2023 1:43 PM

## 2023-03-09 NOTE — Assessment & Plan Note (Signed)
 Last A1c 6.7.  Continue management per endocrinology.  Do not think this is contributing significantly to his nocturia at this point.

## 2023-03-09 NOTE — Assessment & Plan Note (Signed)
 Likely the source of his worsening nocturia.  He is on Cardura 8 mg daily.  We are checking labs as above.  Will likely need to be referred back to urology pending on results of labs today.

## 2023-03-09 NOTE — Assessment & Plan Note (Signed)
 Blood pressure at goal today without meds.

## 2023-03-10 LAB — URINE CULTURE
MICRO NUMBER:: 15938142
Result:: NO GROWTH
SPECIMEN QUALITY:: ADEQUATE

## 2023-03-13 NOTE — Progress Notes (Signed)
 His tests are all normal. No signs of infection or inflammation.  His PSA is normal.  Recommend referral to urology if he is still having symptoms.

## 2023-03-30 DIAGNOSIS — H26493 Other secondary cataract, bilateral: Secondary | ICD-10-CM | POA: Diagnosis not present

## 2023-03-30 DIAGNOSIS — H40013 Open angle with borderline findings, low risk, bilateral: Secondary | ICD-10-CM | POA: Diagnosis not present

## 2023-03-30 LAB — HM DIABETES EYE EXAM

## 2023-07-07 DIAGNOSIS — D225 Melanocytic nevi of trunk: Secondary | ICD-10-CM | POA: Diagnosis not present

## 2023-07-07 DIAGNOSIS — L814 Other melanin hyperpigmentation: Secondary | ICD-10-CM | POA: Diagnosis not present

## 2023-07-07 DIAGNOSIS — L718 Other rosacea: Secondary | ICD-10-CM | POA: Diagnosis not present

## 2023-07-07 DIAGNOSIS — L821 Other seborrheic keratosis: Secondary | ICD-10-CM | POA: Diagnosis not present

## 2023-07-20 ENCOUNTER — Encounter: Payer: Self-pay | Admitting: Family Medicine

## 2023-07-20 ENCOUNTER — Ambulatory Visit: Admitting: Family Medicine

## 2023-07-20 VITALS — BP 105/66 | HR 93 | Temp 98.1°F | Wt 165.8 lb

## 2023-07-20 DIAGNOSIS — N401 Enlarged prostate with lower urinary tract symptoms: Secondary | ICD-10-CM

## 2023-07-20 DIAGNOSIS — I152 Hypertension secondary to endocrine disorders: Secondary | ICD-10-CM

## 2023-07-20 DIAGNOSIS — L570 Actinic keratosis: Secondary | ICD-10-CM | POA: Diagnosis not present

## 2023-07-20 DIAGNOSIS — E1159 Type 2 diabetes mellitus with other circulatory complications: Secondary | ICD-10-CM | POA: Diagnosis not present

## 2023-07-20 DIAGNOSIS — E119 Type 2 diabetes mellitus without complications: Secondary | ICD-10-CM | POA: Diagnosis not present

## 2023-07-20 MED ORDER — MIRABEGRON ER 50 MG PO TB24: 50.0000 mg | ORAL_TABLET | Freq: Every day | ORAL | 5 refills | Status: DC

## 2023-07-20 NOTE — Progress Notes (Signed)
   Scott Herman is a 89 y.o. male who presents today for an office visit.  Assessment/Plan:  Chronic Problems Addressed Today: BPH (benign prostatic hyperplasia) Discussed with patient his PSA earlier this year was normal.  Would not repeat.  Still having issues with nocturia.  On Cardura  8 mg daily though is not sure if it is beneficial.  Sounds like he may be having some OAB symptoms as well.  His UA and urine culture was negative last time he was here-do not need to repeat today.  We did discuss having him follow back up with urology however he would like to hold off on this for now.  Will start Myrbetriq to see if this can help some with his OAB symptoms.  We discussed potential side effects.  He will follow-up with us  in a few weeks via MyChart.  Hypertension associated with diabetes (HCC) BP at goal on cardura  8 mg daily.   Actinic keratosis Cryotherapy applied today.  See below procedure note.  He tolerated well.  Diabetes [facility]) Follows with endocrinology.  Most recent A1c was 6.7.  Check urine microalbumin today.     Subjective:  HPI:  See Assessment / plan for status of chronic conditions. Patient is here today for PSA check. He would lke to have this checked today due to president Biden recently being diagnosed with prostate cancer. He is having frequently nocturia and wakes up multiple times per night to urinate.  Sometimes gets a sudden urge to urinate.       Objective:  Physical Exam: BP 105/66   Pulse 93   Temp 98.1 F (36.7 C) (Temporal)   Wt 165 lb 12.8 oz (75.2 kg)   SpO2 96%   BMI 23.79 kg/m   Gen: No acute distress, resting comfortably CV: Regular rate and rhythm with no murmurs appreciated Pulm: Normal work of breathing, clear to auscultation bilaterally with no crackles, wheezes, or rhonchi Skin: actinic keratosis on posterior right ear and scalp. Neuro: Grossly normal, moves all extremities Psych: Normal affect and thought content  Cryotherapy  Procedure Note  Pre-operative Diagnosis: Actinic keratosis  Locations: Posterior scalp  Indications: Therapeutic  Procedure Details  Patient informed of risks (permanent scarring, infection, light or dark discoloration, bleeding, infection, weakness, numbness and recurrence of the lesion) and benefits of the procedure and verbal informed consent obtained.  The areas are treated with liquid nitrogen therapy, frozen until ice ball extended 3 mm beyond lesion, allowed to thaw, and treated again. The patient tolerated procedure well.  The patient was instructed on post-op care, warned that there may be blister formation, redness and pain. Recommend OTC analgesia as needed for pain.  Condition: Stable  Complications: none.       Jinny Mounts. Daneil Dunker, MD 07/20/2023 2:45 PM

## 2023-07-20 NOTE — Assessment & Plan Note (Signed)
Cryotherapy applied today.  See below procedure note.  He tolerated well. 

## 2023-07-20 NOTE — Assessment & Plan Note (Signed)
 Discussed with patient his PSA earlier this year was normal.  Would not repeat.  Still having issues with nocturia.  On Cardura  8 mg daily though is not sure if it is beneficial.  Sounds like he may be having some OAB symptoms as well.  His UA and urine culture was negative last time he was here-do not need to repeat today.  We did discuss having him follow back up with urology however he would like to hold off on this for now.  Will start Myrbetriq to see if this can help some with his OAB symptoms.  We discussed potential side effects.  He will follow-up with us  in a few weeks via MyChart.

## 2023-07-20 NOTE — Assessment & Plan Note (Signed)
 Follows with endocrinology.  Most recent A1c was 6.7.  Check urine microalbumin today.

## 2023-07-20 NOTE — Assessment & Plan Note (Signed)
 BP at goal on cardura  8 mg daily.

## 2023-07-20 NOTE — Patient Instructions (Signed)
 It was very nice to see you today!  We checked your PSA a few months ago and it was normal.  Please try the myrbetriq to help with your urination.   Let me know in a few weeks out this is working for you.  We froze the spots on your scalp today.   No follow-ups on file.   Take care, Dr Daneil Dunker  PLEASE NOTE:  If you had any lab tests, please let us  know if you have not heard back within a few days. You may see your results on mychart before we have a chance to review them but we will give you a call once they are reviewed by us .   If we ordered any referrals today, please let us  know if you have not heard from their office within the next week.   If you had any urgent prescriptions sent in today, please check with the pharmacy within an hour of our visit to make sure the prescription was transmitted appropriately.   Please try these tips to maintain a healthy lifestyle:  Eat at least 3 REAL meals and 1-2 snacks per day.  Aim for no more than 5 hours between eating.  If you eat breakfast, please do so within one hour of getting up.   Each meal should contain half fruits/vegetables, one quarter protein, and one quarter carbs (no bigger than a computer mouse)  Cut down on sweet beverages. This includes juice, soda, and sweet tea.   Drink at least 1 glass of water with each meal and aim for at least 8 glasses per day  Exercise at least 150 minutes every week.

## 2023-08-03 DIAGNOSIS — H26493 Other secondary cataract, bilateral: Secondary | ICD-10-CM | POA: Diagnosis not present

## 2023-08-03 DIAGNOSIS — H40013 Open angle with borderline findings, low risk, bilateral: Secondary | ICD-10-CM | POA: Diagnosis not present

## 2023-08-03 DIAGNOSIS — E113293 Type 2 diabetes mellitus with mild nonproliferative diabetic retinopathy without macular edema, bilateral: Secondary | ICD-10-CM | POA: Diagnosis not present

## 2023-08-03 DIAGNOSIS — H353132 Nonexudative age-related macular degeneration, bilateral, intermediate dry stage: Secondary | ICD-10-CM | POA: Diagnosis not present

## 2023-08-14 DIAGNOSIS — K080 Exfoliation of teeth due to systemic causes: Secondary | ICD-10-CM | POA: Diagnosis not present

## 2023-09-26 ENCOUNTER — Encounter: Admitting: Family Medicine

## 2023-09-27 ENCOUNTER — Ambulatory Visit: Payer: Self-pay

## 2023-09-27 NOTE — Telephone Encounter (Signed)
 Please call patient and schedule appointment sooner than 8/5. Thanks

## 2023-09-27 NOTE — Telephone Encounter (Signed)
 FYI Only or Action Required?: FYI only for provider.  Patient was last seen in primary care on 07/20/2023 by Kennyth Worth HERO, MD.  Called Nurse Triage reporting Leg Swelling.  Symptoms began about a month ago.  Symptoms are: gradually worsening.  Triage Disposition: See Physician Within 24 Hours  Patient/caregiver understands and will follow disposition?: Yes  Patient has an appointment 8/5 with Dr. Kennyth already scheduled.     Copied from CRM [id number]. Topic: Clinical - Red Word Triage >> Sep 27, 2023 11:28 AM Jasmin G wrote: [facility] that prompted transfer to Nurse Triage: Pt is experiencing swelling from knee down, he wears support but it doesn't seem to do any good for him, right leg has been swollen for quite a while, left leg just started     Reason for Disposition  [1] MODERATE leg swelling (e.g., swelling extends up to knees) AND [2] new-onset or getting worse    Right leg swelling is chronic, left leg swelling within the last month up to his calf, denies any other symptoms, patient has an appointment on 8/5  Answer Assessment - Initial Assessment Questions 1. ONSET: When did the swelling start? (e.g., minutes, hours, days)     1 month for left leg  2. LOCATION: What part of the leg is swollen?  Are both legs swollen or just one leg?     Right leg is chronic, left leg for the last month 3. SEVERITY: How bad is the swelling? (e.g., localized; mild, moderate, severe)     Mild to moderate   4. REDNESS: Is there redness or signs of infection?     No new redness  5. PAIN: Is the swelling painful to touch? If Yes, ask: How painful is it?   (Scale 1-10; mild, moderate or severe)     Intermittent mild pain  6. FEVER: Do you have a fever? If Yes, ask: What is it, how was it measured, and when did it start?      No 7. CAUSE: What do you think is causing the leg swelling?     Unsure  9. RECURRENT SYMPTOM: Have you had leg swelling before? If Yes, ask:  When was the last time? What happened that time?     Yes, but not in left leg  10. OTHER SYMPTOMS: Do you have any other symptoms? (e.g., chest pain, difficulty breathing)       No  Protocols used: Leg Swelling and Edema-A-AH

## 2023-09-28 NOTE — Telephone Encounter (Signed)
 Spoke with pt. Pt does not feel like he needs to be seen sooner. He wants to wait to see Kennyth on 10/03/23. Advised pt to call if anything gets worse or new symptoms.

## 2023-10-03 ENCOUNTER — Ambulatory Visit: Admitting: Family Medicine

## 2023-10-03 ENCOUNTER — Encounter: Payer: Self-pay | Admitting: Family Medicine

## 2023-10-03 VITALS — BP 123/72 | HR 87 | Temp 97.9°F | Ht 70.0 in | Wt 171.8 lb

## 2023-10-03 DIAGNOSIS — N401 Enlarged prostate with lower urinary tract symptoms: Secondary | ICD-10-CM

## 2023-10-03 DIAGNOSIS — I152 Hypertension secondary to endocrine disorders: Secondary | ICD-10-CM

## 2023-10-03 DIAGNOSIS — M7989 Other specified soft tissue disorders: Secondary | ICD-10-CM | POA: Diagnosis not present

## 2023-10-03 DIAGNOSIS — E1169 Type 2 diabetes mellitus with other specified complication: Secondary | ICD-10-CM

## 2023-10-03 DIAGNOSIS — K219 Gastro-esophageal reflux disease without esophagitis: Secondary | ICD-10-CM

## 2023-10-03 DIAGNOSIS — Z794 Long term (current) use of insulin: Secondary | ICD-10-CM

## 2023-10-03 DIAGNOSIS — E1159 Type 2 diabetes mellitus with other circulatory complications: Secondary | ICD-10-CM

## 2023-10-03 DIAGNOSIS — Z0001 Encounter for general adult medical examination with abnormal findings: Secondary | ICD-10-CM

## 2023-10-03 DIAGNOSIS — E785 Hyperlipidemia, unspecified: Secondary | ICD-10-CM

## 2023-10-03 DIAGNOSIS — E119 Type 2 diabetes mellitus without complications: Secondary | ICD-10-CM

## 2023-10-03 LAB — COMPREHENSIVE METABOLIC PANEL WITH GFR
ALT: 13 U/L (ref 0–53)
AST: 17 U/L (ref 0–37)
Albumin: 3.8 g/dL (ref 3.5–5.2)
Alkaline Phosphatase: 80 U/L (ref 39–117)
BUN: 13 mg/dL (ref 6–23)
CO2: 29 meq/L (ref 19–32)
Calcium: 9.1 mg/dL (ref 8.4–10.5)
Chloride: 100 meq/L (ref 96–112)
Creatinine, Ser: 0.82 mg/dL (ref 0.40–1.50)
GFR: 78.48 mL/min (ref >60.00)
Glucose, Bld: 174 mg/dL — ABNORMAL HIGH (ref 70–99)
Potassium: 3.9 meq/L (ref 3.5–5.1)
Sodium: 135 meq/L (ref 135–145)
Total Bilirubin: 0.5 mg/dL (ref 0.2–1.2)
Total Protein: 6.1 g/dL (ref 6.0–8.3)

## 2023-10-03 LAB — LIPID PANEL
Cholesterol: 116 mg/dL (ref 0–200)
HDL: 54.4 mg/dL (ref >39.00)
LDL Cholesterol: 42 mg/dL (ref 0–99)
NonHDL: 61.62
Total CHOL/HDL Ratio: 2
Triglycerides: 97 mg/dL (ref 0.0–149.0)
VLDL: 19.4 mg/dL (ref 0.0–40.0)

## 2023-10-03 LAB — CBC
HCT: 38.7 % — ABNORMAL LOW (ref 39.0–52.0)
Hemoglobin: 13.1 g/dL (ref 13.0–17.0)
MCHC: 33.8 g/dL (ref 30.0–36.0)
MCV: 87.6 fl (ref 78.0–100.0)
Platelets: 267 K/uL (ref 150.0–400.0)
RBC: 4.42 Mil/uL (ref 4.22–5.81)
RDW: 14.1 % (ref 11.5–15.5)
WBC: 6.6 K/uL (ref 4.0–10.5)

## 2023-10-03 LAB — PSA: PSA: 1.32 ng/mL (ref 0.10–4.00)

## 2023-10-03 LAB — TSH: TSH: 1.66 u[IU]/mL (ref 0.35–5.50)

## 2023-10-03 LAB — HEMOGLOBIN A1C: Hgb A1c MFr Bld: 6.7 % — ABNORMAL HIGH (ref 4.6–6.5)

## 2023-10-03 NOTE — Assessment & Plan Note (Signed)
 Most recent A1c 6.7.  He has been following with endocrinology however would like for us  to take over care.  He has been having ongoing issues with lower extremity swelling.  Advised him to discontinue Actos  this is likely the main contributor at this point.  He will continue his Toujeo  14 units daily and metformin 750 mg twice daily.  Recheck A1c in 3 months.  May consider addition of GLP-1 agonist in the future depending on glucose control without his Actos .

## 2023-10-03 NOTE — Assessment & Plan Note (Signed)
 Chronic issue that is worsening.  Likely multifactorial however Actos  is likely contributing significantly.  Will discontinue today.  He will follow-up with us  in a few weeks via MyChart.

## 2023-10-03 NOTE — Assessment & Plan Note (Signed)
 On doxazosin  8 mg daily.  We previously had him on Myrbetriq  however he could not tolerate due to side effects.  We did discuss trial of oxybutynin however would like to hold off on this for now.  Will check PSA today.  He will let us  know if symptoms worsen.

## 2023-10-03 NOTE — Assessment & Plan Note (Signed)
 On pravastatin  80 mg daily.  Check labs today.

## 2023-10-03 NOTE — Assessment & Plan Note (Signed)
 At goal today on Cardura  8 mg daily.

## 2023-10-03 NOTE — Progress Notes (Signed)
 Chief Complaint:  Scott Herman is a 89 y.o. male who presents today for his annual comprehensive physical exam.    Assessment/Plan:  Chronic Problems Addressed Today: Diabetes (HCC) Most recent A1c 6.7.  He has been following with endocrinology however would like for us  to take over care.  He has been having ongoing issues with lower extremity swelling.  Advised him to discontinue Actos  this is likely the main contributor at this point.  He will continue his Toujeo  14 units daily and metformin 750 mg twice daily.  Recheck A1c in 3 months.  May consider addition of GLP-1 agonist in the future depending on glucose control without his Actos .  Hypertension associated with diabetes (HCC) At goal today on Cardura  8 mg daily.  BPH (benign prostatic hyperplasia) On doxazosin  8 mg daily.  We previously had him on Myrbetriq  however he could not tolerate due to side effects.  We did discuss trial of oxybutynin however would like to hold off on this for now.  Will check PSA today.  He will let us  know if symptoms worsen.  Leg swelling Chronic issue that is worsening.  Likely multifactorial however Actos  is likely contributing significantly.  Will discontinue today.  He will follow-up with us  in a few weeks via MyChart.  Dyslipidemia associated with type 2 diabetes mellitus (HCC) On pravastatin  80 mg daily.  Check labs today.  Preventative Healthcare: Check labs. Prevnar 20 given today. Will get flu shot later today.   Patient Counseling(The following topics were reviewed and/or handout was given):  -Nutrition: Stressed importance of moderation in sodium/caffeine intake, saturated fat and cholesterol, caloric balance, sufficient intake of fresh fruits, vegetables, and fiber.  -Stressed the importance of regular exercise.   -Substance Abuse: Discussed cessation/primary prevention of tobacco, alcohol, or other drug use; driving or other dangerous activities under the influence; availability of  treatment for abuse.   -Injury prevention: Discussed safety belts, safety helmets, smoke detector, smoking near bedding or upholstery.   -Sexuality: Discussed sexually transmitted diseases, partner selection, use of condoms, avoidance of unintended pregnancy and contraceptive alternatives.   -Dental health: Discussed importance of regular tooth brushing, flossing, and dental visits.  -Health maintenance and immunizations reviewed. Please refer to Health maintenance section.  Return to care in 1 year for next preventative visit.     Subjective:  HPI:  He has no acute complaints today. Patient is here today for his annual physical. See assessment / plan for status of chronic conditions.   Lifestyle Diet: Balanced.  Trying to get plenty of fruits and vegetables. Exercise: Limited recently.  Trying to stay active on training bicycle.     10/03/2023   12:56 PM  Depression screen PHQ 2/9  Decreased Interest 0  Down, Depressed, Hopeless 0  PHQ - 2 Score 0    Health Maintenance Due  Topic Date Due   HEMOGLOBIN A1C  05/13/2019   FOOT EXAM  03/03/2023     ROS: Per HPI, otherwise a complete review of systems was negative.   PMH:  The following were reviewed and entered/updated in epic: Past Medical History:  Diagnosis Date   Abnormal stress electrocardiogram test Jan 2013   Negative cardiac catheterization.    Diabetes mellitus, type 2 (HCC)    Dr Altheimer   Diverticulosis    GERD (gastroesophageal reflux disease)    Hiatal hernia    Hyperlipidemia    Hypertension    Normal cardiac stress test Jan 2009   Normal ETT   Palpitations  Negative holter in 2009 except for tachycardia with bicycling   Patient Active Problem List   Diagnosis Date Noted   Dilation of aorta (HCC) 01/29/2021   Osteoarthritis 02/24/2020   Encounter for long-term (current) use of high-risk medication 08/13/2019   Actinic keratosis 07/01/2019   Leg swelling 07/01/2019   Low back pain 11/01/2018    Pain in right knee 11/21/2017   Insomnia 08/28/2017   Penile irritation 08/28/2017   Yeast dermatitis of penis 08/17/2017   Ankle pain 05/23/2017   Rash of genital area 05/16/2017   Benign neoplasm of sigmoid colon    Lower GI bleed 12/20/2016   Dyslipidemia associated with type 2 diabetes mellitus (HCC) 04/15/2016   BPH (benign prostatic hyperplasia) 01/12/2015   Hypertension associated with diabetes (HCC) 06/26/2014   Abnormal stress test 04/19/2011   GERD 08/27/2009   Diverticulosis of colon 08/27/2009   Diabetes (HCC) 05/28/2007   Microscopic hematuria 05/28/2007   Palpitations 02/09/2007   Past Surgical History:  Procedure Laterality Date   CARDIAC CATHETERIZATION  Feb 2013   Mild nonobstructive CAD. Normal LV function   CHOLECYSTECTOMY     COLONOSCOPY     Dr Aneita   COLONOSCOPY WITH PROPOFOL  N/A 12/22/2016   Procedure: COLONOSCOPY WITH PROPOFOL ;  Surgeon: Avram Lupita BRAVO, MD;  Location: [facility];  Service: Endoscopy;  Laterality: N/A;   HEMORRHOID SURGERY     INGUINAL HERNIA REPAIR     ORIF CLAVICULAR FRACTURE     left   TONSILLECTOMY      Family History  Problem Relation Age of Onset   Prostate cancer Father    Diabetes Father    Diverticulosis Father    Hypertension Brother    Diabetes Brother    Prostate cancer Paternal Grandfather    Diabetes Paternal Grandmother    Colon cancer Neg Hx     Medications- reviewed and updated Current Outpatient Medications  Medication Sig Dispense Refill   acetaminophen  (TYLENOL ) 650 MG CR tablet Take 1,300 mg by mouth every 8 (eight) hours as needed for pain.     aspirin  81 MG tablet Take 1 tablet (81 mg total) by mouth at bedtime. 30 tablet    B-D UF III MINI PEN NEEDLES 31G X 5 MM MISC SMARTSIG:1 Needle SUB-Q Daily     Blood Glucose Calibration (OT ULTRA/FASTTK CNTRL SOLN) SOLN Use to test blood sugars (Dx: E11.9)     CINNAMON PO Take 2,000 mg by mouth at bedtime.     co-enzyme Q-10 30 MG capsule Take 30 mg by mouth  daily.     Continuous Glucose Sensor (DEXCOM G7 SENSOR) MISC CHANGE SENSOR EVERY 10 DAY     doxazosin  (CARDURA ) 4 MG tablet Take 2 tablets by mouth once daily 180 tablet 3   esomeprazole  (NEXIUM ) 40 MG capsule Take 1 capsule (40 mg total) by mouth daily before breakfast. 90 capsule 1   glucose blood (ONETOUCH VERIO) test strip Use to test blood sugars 4 times a day or as directed (Dx: E11.65)     Insulin  Glargine (TOUJEO  SOLOSTAR [place]) Inject 14 Units into the skin every morning.     metFORMIN (GLUCOPHAGE-XR) 750 MG 24 hr tablet Take 750 mg by mouth 2 (two) times daily.     Multiple Vitamins-Minerals (MENS 50+ MULTI VITAMIN/MIN) TABS Take 1 tablet by mouth daily.     pioglitazone  (ACTOS ) 45 MG tablet Take 45 mg by mouth daily.     pravastatin  (PRAVACHOL ) 80 MG tablet Take 80 mg by mouth at  bedtime.     No current facility-administered medications for this visit.    Allergies-reviewed and updated No Known Allergies  Social History   Socioeconomic History   Marital status: Married    Spouse name: Not on file   Number of children: Not on file   Years of education: Not on file   Highest education level: Not on file  Occupational History   Occupation: Retired  Tobacco Use   Smoking status: Never   Smokeless tobacco: Never  Vaping Use   Vaping status: Never Used  Substance and Sexual Activity   Alcohol use: No    Alcohol/week: 0.0 standard drinks of alcohol   Drug use: No   Sexual activity: Yes  Other Topics Concern   Not on file  Social History Narrative   Not on file   Social Drivers of Health   Financial Resource Strain: Low Risk  (01/31/2023)   Overall Financial Resource Strain (CARDIA)    Difficulty of Paying Living Expenses: Not hard at all  Food Insecurity: No Food Insecurity (01/31/2023)   Hunger Vital Sign    Worried About Running Out of Food in the Last Year: Never true    Ran Out of Food in the Last Year: Never true  Transportation Needs: No Transportation Needs  (01/31/2023)   PRAPARE - Administrator, Civil Service (Medical): No    Lack of Transportation (Non-Medical): No  Physical Activity: Sufficiently Active (01/31/2023)   Exercise Vital Sign    Days of Exercise per Week: 5 days    Minutes of Exercise per Session: 30 min  Stress: No Stress Concern Present (01/31/2023)   [employer] of Occupational Health - Occupational Stress Questionnaire    Feeling of Stress : Not at all  Social Connections: Moderately Integrated (01/31/2023)   Social Connection and Isolation Panel    Frequency of Communication with Friends and Family: More than three times a week    Frequency of Social Gatherings with Friends and Family: More than three times a week    Attends Religious Services: More than 4 times per year    Active Member of [employer] or Organizations: No    Attends Engineer, structural: Never    Marital Status: Married        Objective:  Physical Exam: BP 123/72   Pulse 87   Temp 97.9 F (36.6 C) (Temporal)   Ht 5' 10 (1.778 m)   Wt 171 lb 12.8 oz (77.9 kg)   SpO2 97%   BMI 24.65 kg/m   Body mass index is 24.65 kg/m. Wt Readings from Last 3 Encounters:  10/03/23 171 lb 12.8 oz (77.9 kg)  07/20/23 165 lb 12.8 oz (75.2 kg)  03/09/23 169 lb 9.6 oz (76.9 kg)   Gen: NAD, resting comfortably HEENT: TMs normal bilaterally. OP clear. No thyromegaly noted.  CV: RRR with no murmurs appreciated Pulm: NWOB, CTAB with no crackles, wheezes, or rhonchi GI: Normal bowel sounds present. Soft, Nontender, Nondistended. MSK: 2+ edema in right lower extremity to knee.  1+ edema in left lower extremity.  Neurovascular intact distally. Skin: warm, dry Neuro: CN2-12 grossly intact. Strength 5/5 in upper and lower extremities. Reflexes symmetric and intact bilaterally.  Psych: Normal affect and thought content     Elmer Boutelle M. Kennyth, MD 10/03/2023 1:36 PM

## 2023-10-03 NOTE — Patient Instructions (Signed)
 It was very nice to see you today!  We will check blood work today and give your pneumonia vaccine.  VISIT SUMMARY: Today, we discussed your diabetes management, leg swelling, and frequent nighttime urination. We made some changes to your medications and planned follow-up tests and vaccinations.  YOUR PLAN: DIABETES MELLITUS: You have long-standing diabetes with a recent A1c of 6.7%. -Stop taking pioglitazone  as it may be causing your leg swelling. -Continue taking Toujeo  and metformin. -Monitor your blood glucose levels and adjust your Toujeo  dosage if necessary. -We will order an A1c test to check your blood sugar control. -Follow up in three months. -We may consider new medications like Mounjaro or Ozempic if needed.  LEG SWELLING: You have chronic leg swelling, likely due to pioglitazone . -Stop taking pioglitazone . -Continue using compression stockings. -Monitor for improvement in swelling over the next week.  BENIGN PROSTATIC HYPERPLASIA: You have frequent nighttime urination managed with doxazosin . -We may consider adding oxybutynin if your symptoms persist. -We will check your PSA levels.  GENERAL HEALTH MAINTENANCE: You are due for some vaccinations and encouraged to stay active. -You need a pneumonia vaccine and a flu shot. -Try to stay active, such as using a bike trainer. -We will give you the pneumonia vaccine today and advise you to get a flu shot when it is available.  Return in about 3 months (around 01/03/2024).   Take care, Dr Kennyth  PLEASE NOTE:  If you had any lab tests, please let us  know if you have not heard back within a few days. You may see your results on mychart before we have a chance to review them but we will give you a call once they are reviewed by us .   If we ordered any referrals today, please let us  know if you have not heard from their office within the next week.   If you had any urgent prescriptions sent in today, please check with the  pharmacy within an hour of our visit to make sure the prescription was transmitted appropriately.   Please try these tips to maintain a healthy lifestyle:  Eat at least 3 REAL meals and 1-2 snacks per day.  Aim for no more than 5 hours between eating.  If you eat breakfast, please do so within one hour of getting up.   Each meal should contain half fruits/vegetables, one quarter protein, and one quarter carbs (no bigger than a computer mouse)  Cut down on sweet beverages. This includes juice, soda, and sweet tea.   Drink at least 1 glass of water with each meal and aim for at least 8 glasses per day  Exercise at least 150 minutes every week.

## 2023-10-04 ENCOUNTER — Ambulatory Visit: Payer: Self-pay | Admitting: Family Medicine

## 2023-10-04 ENCOUNTER — Other Ambulatory Visit: Payer: Self-pay | Admitting: *Deleted

## 2023-10-04 NOTE — Progress Notes (Signed)
 His labs are all stable.  A1c is very well-controlled at 6.7.  As we discussed at his office visit I would like for him to stop the Actos  and come back here in 3 months to recheck his A1c.  Do not need to make any other changes to his treatment plan at this time.

## 2023-11-20 ENCOUNTER — Encounter (INDEPENDENT_AMBULATORY_CARE_PROVIDER_SITE_OTHER): Payer: Medicare Other | Admitting: Ophthalmology

## 2023-11-20 DIAGNOSIS — E113293 Type 2 diabetes mellitus with mild nonproliferative diabetic retinopathy without macular edema, bilateral: Secondary | ICD-10-CM

## 2023-11-20 DIAGNOSIS — I10 Essential (primary) hypertension: Secondary | ICD-10-CM

## 2023-11-20 DIAGNOSIS — H353132 Nonexudative age-related macular degeneration, bilateral, intermediate dry stage: Secondary | ICD-10-CM | POA: Diagnosis not present

## 2023-11-20 DIAGNOSIS — Z7984 Long term (current) use of oral hypoglycemic drugs: Secondary | ICD-10-CM | POA: Diagnosis not present

## 2023-11-20 DIAGNOSIS — H35033 Hypertensive retinopathy, bilateral: Secondary | ICD-10-CM

## 2023-11-20 DIAGNOSIS — H43813 Vitreous degeneration, bilateral: Secondary | ICD-10-CM

## 2023-12-19 ENCOUNTER — Other Ambulatory Visit: Payer: Self-pay | Admitting: Family Medicine

## 2023-12-19 NOTE — Telephone Encounter (Unsigned)
 Copied from CRM #[id number]. Topic: Clinical - Medication Refill >> Dec 19, 2023 10:54 AM Mesmerise C wrote: Medication:  B-D UF III MINI PEN NEEDLES 31G X 5 MM MISC metFORMIN (GLUCOPHAGE-XR) 750 MG 24 hr tablet pravastatin  (PRAVACHOL ) 80 MG tablet Has the patient contacted their pharmacy? No (Agent: If no, request that the patient contact the pharmacy for the refill. If patient does not wish to contact the pharmacy document the reason why and proceed with request.) (Agent: If yes, when and what did the pharmacy advise?) Patient discussed with provider that Dr. Kennyth would be taking over the refills for these medication wants a 90ds of all 3  This is the patient's preferred pharmacy:  [facility] [address] [place] - [address] [place] [address] Phone: [phone] Fax: [phone]  Is this the correct pharmacy for this prescription? Yes If no, delete pharmacy and type the correct one.   Has the prescription been filled recently? No  Is the patient out of the medication? No He's out of the Pravastatin   Has the patient been seen for an appointment in the last year OR does the patient have an upcoming appointment? Yes  Can we respond through MyChart? No  Agent: Please be advised that Rx refills may take up to 3 business days. We ask that you follow-up with your pharmacy.

## 2023-12-20 MED ORDER — METFORMIN HCL ER 750 MG PO TB24: 750.0000 mg | ORAL_TABLET | Freq: Two times a day (BID) | ORAL | 1 refills | Status: AC

## 2023-12-20 MED ORDER — BD PEN NEEDLE MINI U/F 31G X 5 MM MISC: 1 refills | Status: AC

## 2023-12-20 MED ORDER — PRAVASTATIN SODIUM 80 MG PO TABS
80.0000 mg | ORAL_TABLET | Freq: Every day | ORAL | 1 refills | Status: AC
Start: 2023-12-20 — End: ?

## 2024-01-04 ENCOUNTER — Ambulatory Visit: Admitting: Family Medicine

## 2024-01-04 ENCOUNTER — Encounter: Payer: Self-pay | Admitting: Family Medicine

## 2024-01-04 VITALS — BP 138/64 | HR 81 | Temp 98.0°F | Ht 70.0 in | Wt 166.6 lb

## 2024-01-04 DIAGNOSIS — M7989 Other specified soft tissue disorders: Secondary | ICD-10-CM

## 2024-01-04 DIAGNOSIS — G3184 Mild cognitive impairment, so stated: Secondary | ICD-10-CM | POA: Diagnosis not present

## 2024-01-04 DIAGNOSIS — E1159 Type 2 diabetes mellitus with other circulatory complications: Secondary | ICD-10-CM

## 2024-01-04 DIAGNOSIS — I152 Hypertension secondary to endocrine disorders: Secondary | ICD-10-CM

## 2024-01-04 DIAGNOSIS — Z7984 Long term (current) use of oral hypoglycemic drugs: Secondary | ICD-10-CM

## 2024-01-04 DIAGNOSIS — E1169 Type 2 diabetes mellitus with other specified complication: Secondary | ICD-10-CM

## 2024-01-04 DIAGNOSIS — Z794 Long term (current) use of insulin: Secondary | ICD-10-CM

## 2024-01-04 DIAGNOSIS — E119 Type 2 diabetes mellitus without complications: Secondary | ICD-10-CM

## 2024-01-04 LAB — POCT GLYCOSYLATED HEMOGLOBIN (HGB A1C): Hemoglobin A1C: 6.2 % — AB (ref 4.0–5.6)

## 2024-01-04 MED ORDER — TOUJEO SOLOSTAR 300 UNIT/ML ~~LOC~~ SOPN
14.0000 [IU] | PEN_INJECTOR | Freq: Every morning | SUBCUTANEOUS | 11 refills | Status: AC
Start: 2024-01-04 — End: ?

## 2024-01-04 MED ORDER — TIRZEPATIDE 2.5 MG/0.5ML ~~LOC~~ SOAJ: 2.5000 mg | SUBCUTANEOUS | 0 refills | Status: DC

## 2024-01-04 MED ORDER — TIRZEPATIDE 5 MG/0.5ML ~~LOC~~ SOAJ: 5.0000 mg | SUBCUTANEOUS | 0 refills | Status: DC

## 2024-01-04 NOTE — Patient Instructions (Signed)
 It was very nice to see you today!  VISIT SUMMARY: Today, we discussed your diabetes management, recent falls, leg swelling, and memory concerns. Your diabetes is well-controlled, and we have started you on a new medication, Mounjaro, to help with blood sugar and potentially memory. We also talked about ways to manage your leg swelling and the importance of staying active and safe to prevent falls.  YOUR PLAN: TYPE 2 DIABETES MELLITUS: Your diabetes is well-controlled with an A1c of 6.2%. -Continue taking Toujeo  insulin  as prescribed and monitor your blood sugar levels regularly. -We have started you on Mounjaro to help with blood sugar control and potentially prevent dementia. A prescription has been sent to [facility]. -Report any changes in your blood sugar levels. -Follow up in three months to assess your response to Mounjaro.  LOWER EXTREMITY EDEMA: You have persistent swelling in your legs. -Continue to elevate your legs when possible. -Use compression stockings as needed and consider replacing them if they are worn out.  HISTORY OF FALLS: You have had recent falls and difficulty rising. -Engage in safe physical activity and exercises to strengthen your legs. -Consider physical therapy if falls continue. -Be mindful of safety to prevent future falls.  MILD COGNITIVE IMPAIRMENT: You have concerns about your memory. -Engage in cognitive activities such as puzzles and reading to keep your mind active. -Mounjaro may help prevent dementia and slow memory loss.  Return in about 3 months (around 04/05/2024).   Take care, Dr Kennyth  PLEASE NOTE:  If you had any lab tests, please let us  know if you have not heard back within a few days. You may see your results on mychart before we have a chance to review them but we will give you a call once they are reviewed by us .   If we ordered any referrals today, please let us  know if you have not heard from their office within the next week.   If  you had any urgent prescriptions sent in today, please check with the pharmacy within an hour of our visit to make sure the prescription was transmitted appropriately.   Please try these tips to maintain a healthy lifestyle:  Eat at least 3 REAL meals and 1-2 snacks per day.  Aim for no more than 5 hours between eating.  If you eat breakfast, please do so within one hour of getting up.   Each meal should contain half fruits/vegetables, one quarter protein, and one quarter carbs (no bigger than a computer mouse)  Cut down on sweet beverages. This includes juice, soda, and sweet tea.   Drink at least 1 glass of water with each meal and aim for at least 8 glasses per day  Exercise at least 150 minutes every week.

## 2024-01-04 NOTE — Assessment & Plan Note (Signed)
 A1c very well-controlled at 6.2 on Toujeo  14 units daily and metformin 750 mg twice daily.  Overall his glycemic control is excellent however he is concerned about ongoing memory loss and cognitive impairment.  Did discuss with patient recent evidence that suggest Mounjaro may help prevent dementia and additionally could help with blood sugar control as well.  He is agreeable to try this.  We discussed potential side effects.  Start Mounjaro 2.5 mg weekly for 4 weeks and then increase to 5 mg weekly.  May be able to decrease dose of Toujeo  depending on response to this.  He will follow-up with us  in a few weeks via MyChart.  Recheck A1c in 3 months.

## 2024-01-04 NOTE — Progress Notes (Addendum)
 Scott Herman is a 89 y.o. male who presents today for an office visit.  Assessment/Plan:  New/Acute Problems: Fall No subsequent injuries.  Sounds like he lost balance.  We did discuss referral to physical therapy however he declined.  He will let us  know if he changes mind.  Chronic Problems Addressed Today: Diabetes (HCC) A1c very well-controlled at 6.2 on Toujeo  14 units daily and metformin 750 mg twice daily.  Overall his glycemic control is excellent however he is concerned about ongoing memory loss and cognitive impairment.  Did discuss with patient recent evidence that suggest Mounjaro may help prevent dementia and additionally could help with blood sugar control as well.  He is agreeable to try this.  We discussed potential side effects.  Start Mounjaro 2.5 mg weekly for 4 weeks and then increase to 5 mg weekly.  May be able to decrease dose of Toujeo  depending on response to this.  He will follow-up with us  in a few weeks via MyChart.  Recheck A1c in 3 months.  Hypertension associated with diabetes (HCC) At goal today on Cardura  8 milligrams daily.  Leg swelling No significant change since stopping Actos .  He is currently wearing compression stockings.  We discussed importance of leg elevation.  Did discuss trial of diuretic or Jardiance however he declined.   Mild cognitive impairment Currently abdomens are manageable.  We did discuss importance of keeping mind active and doing daily brain training exercises as well.  Will be starting Mounjaro as above which does have some evidence that it may help prevent progression to dementia.     Subjective:  HPI:  See assessment / plan for status of chronic conditions.   Discussed the use of AI scribe software for clinical note transcription with the patient, who gave verbal consent to proceed.  History of Present Illness Scott Herman is an 89 year old male who presents for diabetes management and concerns  about recent falls.  His blood sugar levels are generally well-controlled, with recent readings around 114 mg/dL and an J8r of 3.7%. Occasionally, his blood sugar spikes to 200 mg/dL but usually stabilizes quickly. He is currently using Toujeo  insulin , taking 14-16 units in the morning, and has discontinued Actos  due to concerns about leg swelling. No recent hypoglycemic episodes have occurred.  He experiences persistent swelling in his legs, which has not improved significantly since stopping Actos . He finds it challenging to elevate his legs due to lack of a suitable spot. He uses compression stockings, which are wearing out and need replacement.  He describes two recent falls. The first occurred after mowing the lawn, resulting in an elbow injury that required assistance from his son and a neighbor to get up. The second fall happened when he twisted around and fell on a stool, causing a noticeable scar. He attributes the falls to a lack of strength in his legs and acknowledges the need for more physical activity. He has been doing exercises with his wife's physical therapist.  He has concerns about his memory, noting that it is 'not as great as it used to be.' He is interested in keeping his mind active through puzzles and reading.         Objective:  Physical Exam: BP 138/64   Pulse 81   Temp 98 F (36.7 C) (Temporal)   Ht 5' 10 (1.778 m)   Wt 166 lb 9.6 oz (75.6 kg)   SpO2 97%   BMI 23.90 kg/m  Gen: No acute distress, resting comfortably Neuro: Grossly normal, moves all extremities Psych: Normal affect and thought content      Martine Bleecker M. Kennyth, MD 01/16/2024 7:53 AM

## 2024-01-04 NOTE — Assessment & Plan Note (Signed)
 No significant change since stopping Actos .  He is currently wearing compression stockings.  We discussed importance of leg elevation.  Did discuss trial of diuretic or Jardiance however he declined.

## 2024-01-04 NOTE — Assessment & Plan Note (Signed)
 At goal today on Cardura  8 milligrams daily.

## 2024-01-04 NOTE — Assessment & Plan Note (Signed)
 Currently abdomens are manageable.  We did discuss importance of keeping mind active and doing daily brain training exercises as well.  Will be starting Mounjaro as above which does have some evidence that it may help prevent progression to dementia.

## 2024-01-22 ENCOUNTER — Ambulatory Visit: Admitting: Podiatry

## 2024-01-22 ENCOUNTER — Encounter: Payer: Self-pay | Admitting: Podiatry

## 2024-01-22 VITALS — Ht 70.0 in | Wt 166.6 lb

## 2024-01-22 DIAGNOSIS — M79675 Pain in left toe(s): Secondary | ICD-10-CM | POA: Diagnosis not present

## 2024-01-22 DIAGNOSIS — M79674 Pain in right toe(s): Secondary | ICD-10-CM | POA: Diagnosis not present

## 2024-01-22 DIAGNOSIS — Z0189 Encounter for other specified special examinations: Secondary | ICD-10-CM

## 2024-01-22 DIAGNOSIS — E119 Type 2 diabetes mellitus without complications: Secondary | ICD-10-CM | POA: Diagnosis not present

## 2024-01-22 DIAGNOSIS — B351 Tinea unguium: Secondary | ICD-10-CM | POA: Diagnosis not present

## 2024-02-04 NOTE — Progress Notes (Signed)
   Chief Complaint  Patient presents with   Callouses    Pt is here due to callous on the end of the right great toe, states he noticed it this weekend, only causes pain if he rubs it against something.    SUBJECTIVE Patient with a history of diabetes mellitus presents to office today complaining of elongated, thickened nails that cause pain while ambulating in shoes.  He also has noticed some thickening with callus formation to the great toes that he would like to have evaluated  Past Medical History:  Diagnosis Date   Abnormal stress electrocardiogram test Jan 2013   Negative cardiac catheterization.    Diabetes mellitus, type 2 (HCC)    Dr Altheimer   Diverticulosis    GERD (gastroesophageal reflux disease)    Hiatal hernia    Hyperlipidemia    Hypertension    Normal cardiac stress test Jan 2009   Normal ETT   Palpitations    Negative holter in 2009 except for tachycardia with bicycling    No Known Allergies  OBJECTIVE General Patient is awake, alert, and oriented x 3 and in no acute distress. Derm Skin is dry and supple bilateral. Negative open lesions or macerations. Remaining integument unremarkable. Nails are tender, long, thickened and dystrophic with subungual debris, consistent with onychomycosis, 1-5 bilateral.  There is some callus tissue noted to the distal aspect of the toes but no overlying wound Vasc  DP and PT pedal pulses palpable bilaterally. Temperature gradient within normal limits.  Chronic edema noted right lower extremity compared to the contralateral limb up to the level of the knee Neuro light touch and protective threshold sensation diminished bilaterally.  Musculoskeletal Exam No symptomatic pedal deformities noted bilateral. Muscular strength within normal limits.  ASSESSMENT 1. Diabetes Mellitus w/ peripheral neuropathy 2.  Encounter for diabetic foot exam  3.  Pain due to onychomycosis of toenails bilateral  PLAN OF CARE -Patient evaluated  today.  Comprehensive diabetic foot exam performed today -Instructed to maintain good pedal hygiene and foot care. Stressed importance of controlling blood sugar.  -Mechanical debridement of nails 1-5 bilaterally performed using a nail nipper. Filed with dremel without incident.  The hyperkeratotic callus tissue was also debrided from the distal tips of the toes without incident or bleeding -In regards to the toe wounds, we will simply allow these to heal.  No intervention.  Will observe for now -Return to clinic 3 months routine footcare  Thresa EMERSON Sar, DPM [facility]  Dr. Thresa EMERSON Sar, DPM    [address] [place], [place] [address]                Office [phone]  Fax [phone]

## 2024-02-07 ENCOUNTER — Emergency Department (HOSPITAL_BASED_OUTPATIENT_CLINIC_OR_DEPARTMENT_OTHER)

## 2024-02-07 ENCOUNTER — Encounter (HOSPITAL_BASED_OUTPATIENT_CLINIC_OR_DEPARTMENT_OTHER): Payer: Self-pay | Admitting: Emergency Medicine

## 2024-02-07 ENCOUNTER — Emergency Department (HOSPITAL_BASED_OUTPATIENT_CLINIC_OR_DEPARTMENT_OTHER)
Admission: EM | Admit: 2024-02-07 | Discharge: 2024-02-07 | Disposition: A | Discharge status: Home / Self Care | Attending: Emergency Medicine | Admitting: Emergency Medicine

## 2024-02-07 ENCOUNTER — Other Ambulatory Visit: Payer: Self-pay

## 2024-02-07 DIAGNOSIS — W07XXXA Fall from chair, initial encounter: Secondary | ICD-10-CM | POA: Diagnosis not present

## 2024-02-07 DIAGNOSIS — Z79899 Other long term (current) drug therapy: Secondary | ICD-10-CM | POA: Diagnosis not present

## 2024-02-07 DIAGNOSIS — M19032 Primary osteoarthritis, left wrist: Secondary | ICD-10-CM | POA: Diagnosis not present

## 2024-02-07 DIAGNOSIS — L03114 Cellulitis of left upper limb: Secondary | ICD-10-CM | POA: Diagnosis not present

## 2024-02-07 DIAGNOSIS — Z7982 Long term (current) use of aspirin: Secondary | ICD-10-CM | POA: Insufficient documentation

## 2024-02-07 DIAGNOSIS — Z794 Long term (current) use of insulin: Secondary | ICD-10-CM | POA: Insufficient documentation

## 2024-02-07 DIAGNOSIS — M1812 Unilateral primary osteoarthritis of first carpometacarpal joint, left hand: Secondary | ICD-10-CM | POA: Diagnosis not present

## 2024-02-07 DIAGNOSIS — S6992XA Unspecified injury of left wrist, hand and finger(s), initial encounter: Secondary | ICD-10-CM | POA: Diagnosis not present

## 2024-02-07 DIAGNOSIS — S61412A Laceration without foreign body of left hand, initial encounter: Secondary | ICD-10-CM | POA: Diagnosis present

## 2024-02-07 MED ORDER — CEPHALEXIN 250 MG PO CAPS
500.0000 mg | ORAL_CAPSULE | Freq: Once | ORAL | Status: AC
  Administered 2024-02-07: 500 mg via ORAL
  Filled 2024-02-07: qty 2

## 2024-02-07 MED ORDER — ACETAMINOPHEN 500 MG PO TABS
1000.0000 mg | ORAL_TABLET | Freq: Once | ORAL | Status: AC
  Administered 2024-02-07: 1000 mg via ORAL
  Filled 2024-02-07: qty 2

## 2024-02-07 MED ORDER — CEPHALEXIN 500 MG PO CAPS: 500.0000 mg | ORAL_CAPSULE | Freq: Four times a day (QID) | ORAL | 0 refills | Status: AC

## 2024-02-07 MED ORDER — OXYCODONE HCL 5 MG PO TABS: 2.5000 mg | ORAL_TABLET | Freq: Four times a day (QID) | ORAL | 0 refills | Status: DC | PRN

## 2024-02-07 NOTE — ED Provider Notes (Signed)
 [place] EMERGENCY DEPARTMENT AT [facility] Provider Note   CSN: [id number] Arrival date & time: 02/07/24  1328     Patient presents with: Scott [name] Herman is a 89 y.o. male.   Patient presents for skin tear/laceration to dorsum of left hand. Occurred 2 days ago. States he was in a rolling chair, he slipped out of it, and fell. Sustained a lac. Has had increasing pain and swelling to the dorsum of the hand. Pain with extending fingers. Denies other injuries. He is anxious to get back home to his wife whom he cares for. Unknown last tetanus -- refuses update stating since he needs to care for his wife and isn't sure how I'll react to it, he doesn't want it today. No fever. No N/V.        Prior to Admission medications   Medication Sig Start Date End Date Taking? Authorizing Provider  acetaminophen  (TYLENOL ) 650 MG CR tablet Take 1,300 mg by mouth every 8 (eight) hours as needed for pain.    [provider]  aspirin  81 MG tablet Take 1 tablet (81 mg total) by mouth at bedtime. 12/29/16   Odell Celinda Balo, MD  B-D UF III MINI PEN NEEDLES 31G X 5 MM MISC Used daily for insulin  12/20/23   Kennyth Worth HERO, MD  Blood Glucose Calibration (OT ULTRA/FASTTK CNTRL SOLN) SOLN Use to test blood sugars (Dx: E11.9)    [provider]  CINNAMON PO Take 2,000 mg by mouth at bedtime.    [provider]  co-enzyme Q-10 30 MG capsule Take 30 mg by mouth daily.    [provider]  Continuous Glucose Sensor (DEXCOM G7 SENSOR) MISC CHANGE SENSOR EVERY 10 DAY    [provider]  doxazosin  (CARDURA ) 4 MG tablet Take 2 tablets by mouth once daily 12/22/22   Parker, Caleb M, MD  esomeprazole  (NEXIUM ) 40 MG capsule Take 1 capsule (40 mg total) by mouth daily before breakfast. 04/25/12   Aneita Gwendlyn DASEN, MD  glucose blood (ONETOUCH VERIO) test strip Use to test blood sugars 4 times a day or as directed (Dx: E11.65) 05/13/19   [provider]  insulin  glargine, 1 Unit Dial, (TOUJEO  SOLOSTAR) 300 UNIT/ML Solostar Pen Inject 14 Units into the skin every morning. 01/04/24   Kennyth Worth HERO, MD  metFORMIN  (GLUCOPHAGE -XR) 750 MG 24 hr tablet Take 1 tablet (750 mg total) by mouth 2 (two) times daily. 12/20/23   Kennyth Worth HERO, MD  Multiple Vitamins-Minerals (MENS 50+ MULTI VITAMIN/MIN) TABS Take 1 tablet by mouth daily.    [provider]  pravastatin  (PRAVACHOL ) 80 MG tablet Take 1 tablet (80 mg total) by mouth at bedtime. 12/20/23   Kennyth Worth HERO, MD  tirzepatide  (MOUNJARO ) 2.5 MG/0.5ML Pen Inject 2.5 mg into the skin once a week. 01/04/24   Kennyth Worth HERO, MD  tirzepatide  (MOUNJARO ) 5 MG/0.5ML Pen Inject 5 mg into the skin once a week. 01/04/24   Kennyth Worth HERO, MD    Allergies: Patient has no known allergies.    Review of Systems   Updated Vital Signs BP 120/67   Pulse (!) 113   Temp 97.6 F (36.4 C)   Resp 20   SpO2 97%   Physical Exam Vitals and nursing note reviewed.  Constitutional:      Appearance: He is well-developed.  HENT:     Head: Normocephalic and atraumatic.  Eyes:     Conjunctiva/sclera: Conjunctivae normal.  Pulmonary:     Effort: No respiratory distress.  Musculoskeletal:     Cervical back: Normal range of motion and neck supple.     Comments: L hand: Dorsal skin tear radially with ecchymosis, mild bleeding after removal of dressing, ulnarly there is a superficial non-gaping lac. Ring on ring finger is mobile. Light erythema extending to the index and long finger MCP areas bilaterally. Is able to actively flex/extend digits but reports pain. No Knavel signs.   Skin:    General: Skin is warm and dry.     Comments: Cap refill <2s all digits  Neurological:     Mental Status: He is alert.     (all labs ordered are listed, but only abnormal results are displayed) Labs Reviewed - No data to display  EKG: None  Radiology: DG Hand Complete Left Result Date:  02/07/2024 EXAM: 3 OR MORE VIEW(S) XRAY OF THE LEFT HAND 02/07/2024 02:17:00 PM COMPARISON: None available. CLINICAL HISTORY: injury FINDINGS: BONES AND JOINTS: Severe osteoarthritis throughout the interphalangeal joints with central erosive changes compatible with erosive osteoarthritis. Subcortical cystic changes within the scaphoid, likely degenerative. Severe first carpometacarpal (CMC) and scaphotrapeziotrapezoid (STT) joint osteoarthritis. Mild osteoarthritis throughout the remainder of the wrist. No acute fracture. No malalignment. SOFT TISSUES: Soft tissue swelling over the dorsum of the hand. Vascular calcifications. Chondrocalcinosis in the triangular fibrocartilage complex (TFCC). IMPRESSION: 1. No acute fracture or dislocation. 2. Soft tissue swelling over the dorsum of the hand. 3. Severe osteoarthritis throughout the interphalangeal joints with central erosive changes compatible with erosive osteoarthritis. 4. Severe first CMC and STT joint osteoarthritis. 5. Mild osteoarthritis throughout the remainder of the wrist. 6. Chondrocalcinosis in the TFCC. Electronically signed by: Dayne Hassell MD 02/07/2024 02:52 PM EST RP Workstation: [id number]     Procedures   Medications Ordered in the ED  cephALEXin  (KEFLEX ) capsule 500 mg (500 mg Oral Given 02/07/24 1433)  acetaminophen  (TYLENOL ) tablet 1,000 mg (1,000 mg Oral Given 02/07/24 1433)   ED Course  Patient seen and examined. History obtained directly from patient.   Labs/EKG: None ordered  Imaging: Ordered x-ray of hand  Medications/Fluids: Ordered: keflex /tylenol  (pt wants strong meds but is driving). Offered/encouraged tetanus booster but he declines as per HPI.   Most recent vital signs reviewed and are as follows: BP 120/67   Pulse (!) 113   Temp 97.6 F (36.4 C)   Resp 20   SpO2 97%   Initial impression: Skin tear dorsum left hand, possible early cellulitis.   3:01 PM Reassessment performed. Patient appears stable.  Bandaging placed. We talked about his ring, need to return for removal with worsening pain/swelling around the digit, color change in the finger.   Imaging personally visualized and interpreted including: X-ray agree neg for fracture or FB.   Reviewed pertinent lab work and imaging with patient at bedside. Questions answered.   Most current vital signs reviewed and are as follows: BP 120/67   Pulse (!) 113   Temp 97.6 F (36.4 C)   Resp 20   SpO2 97%   Plan: Discharge to home.   Prescriptions written for: keflex , oxy #6 tabs  Use pain medication only under direct supervision at the lowest possible dose needed to control your pain.   Other home care instructions discussed: Elevated hand, wound care.   ED return instructions discussed: Pt urged to return with worsening pain, worsening swelling, expanding area of redness or streaking up extremity, fever, or any other concerns.  Urged to take complete  course of antibiotics as prescribed.  Counseled to take pain medications as prescribed. Pt verbalizes understanding and agrees with plan.   Follow-up instructions discussed: Patient encouraged to follow-up with their PCP in 2 days.                                   Medical Decision Making Amount and/or Complexity of Data Reviewed Radiology: ordered.  Risk OTC drugs. Prescription drug management.   Skin tear, dorsum of hand. Bandaged. Possible early infection, covered with keflex . Discussed use of pain meds at home, Tylenol  hasn't been helping enough. Discussed follow-up and wound care. Declines tetanus. Doesn't want ring removed, discussed precautions, currently not causing any problem.      Final diagnoses:  Skin tear of hand without complication, left, initial encounter  Cellulitis of left hand    ED Discharge Orders          Ordered    cephALEXin  (KEFLEX ) 500 MG capsule  4 times daily        02/07/24 1459    oxyCODONE (OXY IR/ROXICODONE) 5 MG immediate release tablet   Every 6 hours PRN        02/07/24 1459               Alexah Kivett, PA-C 02/07/24 1505    Tonia Chew, MD 02/07/24 1547

## 2024-02-07 NOTE — ED Notes (Signed)
 Reviewed AVS/discharge instruction with patient. Time allotted for and all questions answered. Patient is agreeable for d/c and escorted to ed exit by staff.

## 2024-02-07 NOTE — ED Triage Notes (Signed)
 Fall out of chair on Monday Pain in left hand, some swelling noted, wrapped by patient Cares for wife at home Denies hitting head

## 2024-02-07 NOTE — Discharge Instructions (Signed)
 Please read and follow all provided instructions.  Your diagnoses today include:  1. Skin tear of hand without complication, left, initial encounter   2. Cellulitis of left hand     Tests performed today include: Vital signs. See below for your results today.   Medications prescribed:  Keflex  (cephalexin ) - antibiotic  You have been prescribed an antibiotic medicine: take the entire course of medicine even if you are feeling better. Stopping early can cause the antibiotic not to work.  Oxycodone - narcotic pain medication  DO NOT drive or perform any activities that require you to be awake and alert because this medicine can make you drowsy.   Use pain medication only under direct supervision at the lowest possible dose needed to control your pain.   Take any prescribed medications only as directed.   Home care instructions:  Follow any educational materials contained in this packet. Keep affected area above the level of your heart when possible. Wash area gently twice a day with warm soapy water. Do not apply alcohol or hydrogen peroxide. Cover the area if it draining or weeping.   Follow-up instructions: See your doctor in 2 days for a wound recheck, or return to the Emergency Department for recheck.   Return instructions:  Return to the Emergency Department if you have: Swelling around the ring, if you are unable to remove the ring, if your finger has pain or change in color Fever Worsening symptoms Worsening pain Worsening swelling Redness of the skin that moves away from the affected area, especially if it streaks away from the affected area  Any other emergent concerns  Your vital signs today were: BP 120/67   Pulse (!) 113   Temp 97.6 F (36.4 C)   Resp 20   SpO2 97%  If your blood pressure (BP) was elevated above 135/85 this visit, please have this repeated by your doctor within one month. --------------

## 2024-02-12 ENCOUNTER — Telehealth: Payer: Self-pay

## 2024-02-12 NOTE — Telephone Encounter (Signed)
 Transition Care Management Follow-up Telephone Call Date of discharge and from where: 02/07/24 [facility] How have you been since you were released from the hospital? Same Any questions or concerns? Yes; wound oozing and still excessive swelling  Items Reviewed: Did the pt receive and understand the discharge instructions provided? Yes  Medications obtained and verified? Yes  Other? No  Any new allergies since your discharge? No  Dietary orders reviewed? No Do you have support at home? Yes   Home Care and Equipment/Supplies: Were home health services ordered? not applicable If so, what is the name of the agency?   Has the agency set up a time to come to the patient's home? not applicable Were any new equipment or medical supplies ordered?  No What is the name of the medical supply agency?  Were you able to get the supplies/equipment? not applicable Do you have any questions related to the use of the equipment or supplies? No  Functional Questionnaire: (I = Independent and D = Dependent) ADLs: I  Bathing/Dressing- I  Meal Prep- I  Eating- I  Maintaining continence- I  Transferring/Ambulation- I  Managing Meds- I  Follow up appointments reviewed:  PCP Hospital f/u appt confirmed? Yes  Scheduled to see Worth Kitty on 02/14/24 @ 10:00 am. [facility] f/u appt confirmed? No   Are transportation arrangements needed? No  If their condition worsens, is the pt aware to call PCP or go to the Emergency Dept.? Yes Was the patient provided with contact information for the PCP's office or ED? Yes Was to pt encouraged to call back with questions or concerns? Yes

## 2024-02-14 ENCOUNTER — Encounter: Payer: Self-pay | Admitting: Family Medicine

## 2024-02-14 ENCOUNTER — Ambulatory Visit: Admitting: Family Medicine

## 2024-02-14 VITALS — BP 120/70 | HR 85 | Temp 97.9°F | Ht 70.0 in | Wt 164.0 lb

## 2024-02-14 DIAGNOSIS — M79642 Pain in left hand: Secondary | ICD-10-CM | POA: Diagnosis not present

## 2024-02-14 DIAGNOSIS — E1159 Type 2 diabetes mellitus with other circulatory complications: Secondary | ICD-10-CM

## 2024-02-14 DIAGNOSIS — Z7985 Long-term (current) use of injectable non-insulin antidiabetic drugs: Secondary | ICD-10-CM

## 2024-02-14 DIAGNOSIS — E1165 Type 2 diabetes mellitus with hyperglycemia: Secondary | ICD-10-CM | POA: Diagnosis not present

## 2024-02-14 DIAGNOSIS — I152 Hypertension secondary to endocrine disorders: Secondary | ICD-10-CM | POA: Diagnosis not present

## 2024-02-14 MED ORDER — ONETOUCH VERIO VI STRP: ORAL_STRIP | 11 refills | Status: DC

## 2024-02-14 NOTE — Patient Instructions (Signed)
 It was very nice to see you today!  VISIT SUMMARY: You came in for a follow-up after a skin tear and laceration on your left hand. The wound is healing well, and your diabetes is well-controlled. We discussed your osteoarthritis and current pain management strategies.  YOUR PLAN: LEFT HAND LACERATION AND SKIN TEAR: The wound is healing well with no signs of infection and manageable pain. -SteriStrips have been applied to the wound. Avoid using Vaseline-based ointments. -You can allow the wound to get wet during showers and trim the SteriStrips if necessary. -Continue using Tylenol  for pain management.  OSTEOARTHRITIS OF LEFT HAND: The condition is chronic with bone spurs, but swelling has decreased. -Continue current management.  TYPE 2 DIABETES MELLITUS: Your diabetes is well-controlled with an A1c of 6.2. -Continue current diabetes management. -Urination test strips have been refilled. -A follow-up A1c test is scheduled for February.  Return if symptoms worsen or fail to improve.   Take care, Dr Kennyth  PLEASE NOTE:  If you had any lab tests, please let us  know if you have not heard back within a few days. You may see your results on mychart before we have a chance to review them but we will give you a call once they are reviewed by us .   If we ordered any referrals today, please let us  know if you have not heard from their office within the next week.   If you had any urgent prescriptions sent in today, please check with the pharmacy within an hour of our visit to make sure the prescription was transmitted appropriately.   Please try these tips to maintain a healthy lifestyle:  Eat at least 3 REAL meals and 1-2 snacks per day.  Aim for no more than 5 hours between eating.  If you eat breakfast, please do so within one hour of getting up.   Each meal should contain half fruits/vegetables, one quarter protein, and one quarter carbs (no bigger than a computer mouse)  Cut down on  sweet beverages. This includes juice, soda, and sweet tea.   Drink at least 1 glass of water with each meal and aim for at least 8 glasses per day  Exercise at least 150 minutes every week.

## 2024-02-14 NOTE — Progress Notes (Signed)
 Scott Herman is a 89 y.o. male who presents today for an office visit.  Assessment/Plan:  New/Acute Problems: Hand Pain Patient with large skin tear along radial aspect of dorsum of left hand and approximately a 2 cm healing laceration along radial aspect of dorsum of left hand as well.  These areas appear to be healing normally without any signs of infection or complication today.  Steroid strips were placed today.  We discussed care after.  Anticipate that this will heal normally over the next few weeks.  We discussed reasons to return to care.  Chronic Problems Addressed Today: Hypertension associated with diabetes (HCC) Blood pressure at goal today on Cardura  8 mg daily.  Diabetes (HCC) Last A1c was well-controlled 6.2.  We did prescribe Mounjaro  however he did not start this.  His home sugars have been at goal on current regimen Toujeo  14 units daily and metformin  750 mg twice daily.  Vies patient would be fine for him to stay off the Mounjaro  for now.  Will recheck A1c in a few months     Subjective:  HPI:  See assessment / plan for status of chronic conditions.  Patient is here today for follow-up.  Was in the ED a week ago after suffering a skin tear and laceration to the dorsum of his left hand.  Slipped out of a rolling chair and injured his left hand.  In the ED he had an x-ray which showed soft tissue swelling and severe osteoarthritis but no acute fracture or dislocation.  He was given a course of Keflex  and discharged home.  Discussed the use of AI scribe software for clinical note transcription with the patient, who gave verbal consent to proceed.  History of Present Illness Scott Herman is an 89 year old male who presents for follow-up after a skin tear and laceration on the dorsum of his left hand.  He was in the emergency department a week ago after slipping out of a rolling chair, resulting in a skin tear and laceration on the dorsum of his  left hand. An x-ray at that time showed soft tissue swelling and severe osteoarthritis but no acute fracture or dislocation. He was prescribed a course of Keflex  and discharged home.  The skin tear and laceration are healing, although there is still some fluid discharge. He has been using Neosporin ointment on the wound. He completed a seven-day course of Keflex  and has three doses remaining. He was also prescribed five oxycodone  tablets, of which he took only two, opting instead to manage pain with Tylenol  500 mg every six hours.  His fingers were swollen initially but the swelling has since reduced. He has a wedding band on his finger that has not been removed in fifty years, and he is concerned about the swelling around it.  He manages his diabetes with a OneTouch glucose monitor and reports his blood sugar levels are generally within range, with an average of 125 mg/dL. He is cautious about hypoglycemia and has set his glucose monitor alarm to alert him at 80 mg/dL to prevent low blood sugar episodes. He mentions a past A1c of 6.2%.  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         Objective:  Physical Exam: BP 120/70   Pulse 85   Temp 97.9 F (36.6 C) (Temporal)  Ht 5' 10 (1.778 m)   Wt 164 lb (74.4 kg)   SpO2 95%   BMI 23.53 kg/m   Gen: No acute distress, resting comfortably CV: Regular rate and rhythm with no murmurs appreciated Pulm: Normal work of breathing, clear to auscultation bilaterally with no crackles, wheezes, or rhonchi MUSCULOSKELETAL: Left hand with healing skin tear along radial aspect approximately 6 to 7 cm in diameter.  Healing laceration approximately 2 cm in diameter on right ulnar aspect of dorsal aspect of hand.  No surrounding erythema.  No drainage. Neuro: Grossly normal, moves all extremities Psych: Normal affect and thought  content  Time Spent: 45 minutes of total time was spent on the date of the encounter performing the following actions: chart review prior to seeing the patient, obtaining history, performing a medically necessary exam, counseling on the treatment plan, placing orders, and documenting in our EHR.        Worth HERO. Kennyth, MD 02/14/2024 10:52 AM

## 2024-02-14 NOTE — Assessment & Plan Note (Signed)
 Blood pressure at goal today on Cardura  8 mg daily.

## 2024-02-14 NOTE — Assessment & Plan Note (Signed)
 Last A1c was well-controlled 6.2.  We did prescribe Mounjaro  however he did not start this.  His home sugars have been at goal on current regimen Toujeo  14 units daily and metformin  750 mg twice daily.  Vies patient would be fine for him to stay off the Mounjaro  for now.  Will recheck A1c in a few months

## 2024-02-18 ENCOUNTER — Other Ambulatory Visit: Payer: Self-pay | Admitting: Family Medicine

## 2024-02-20 ENCOUNTER — Other Ambulatory Visit: Payer: Self-pay | Admitting: Family Medicine

## 2024-02-20 LAB — OPHTHALMOLOGY REPORT-SCANNED

## 2024-02-20 NOTE — Telephone Encounter (Unsigned)
 Copied from CRM #[id number]. Topic: Clinical - Medication Refill >> Feb 20, 2024  2:31 PM Lauren C wrote: Medication: Continuous Glucose Sensor (DEXCOM G7 SENSOR) MISC  Has the patient contacted their pharmacy? No Last sent in by another provider with [facility] it looks like. He says he would like Dr. Kennyth to refill this.  Pt says they have come out with one that is a 15 day avail in the first part of January. Wants to know if he can get two months of the regular and then switch over to the 15 day.   This is the patient's preferred pharmacy:  [facility] [address], [place] - [address] [place] [address] Phone: [phone] Fax: [phone]  Is this the correct pharmacy for this prescription? Yes If no, delete pharmacy and type the correct one.   Has the prescription been filled recently? No  Is the patient out of the medication? One left  Has the patient been seen for an appointment in the last year OR does the patient have an upcoming appointment? Yes  Can we respond through MyChart? No, please call [phone]  Agent: Please be advised that Rx refills may take up to 3 business days. We ask that you follow-up with your pharmacy.

## 2024-02-21 ENCOUNTER — Other Ambulatory Visit: Payer: Self-pay | Admitting: *Deleted

## 2024-02-23 ENCOUNTER — Other Ambulatory Visit: Payer: Self-pay | Admitting: Family Medicine

## 2024-03-05 ENCOUNTER — Ambulatory Visit: Payer: Self-pay

## 2024-04-05 ENCOUNTER — Ambulatory Visit: Admitting: Family Medicine

## 2024-11-18 ENCOUNTER — Encounter (INDEPENDENT_AMBULATORY_CARE_PROVIDER_SITE_OTHER): Admitting: Ophthalmology
